# Patient Record
Sex: Female | Born: 1965 | Race: Black or African American | Hispanic: No | Smoking: Never smoker
Health system: Southern US, Community
[De-identification: ages and names within clinical notes are randomized; demographics above are authoritative.]

## PROBLEM LIST (undated history)

## (undated) DIAGNOSIS — I10 Essential (primary) hypertension: Secondary | ICD-10-CM

## (undated) DIAGNOSIS — M1990 Unspecified osteoarthritis, unspecified site: Secondary | ICD-10-CM

## (undated) HISTORY — PX: ENDOMETRIAL ABLATION: SHX621

## (undated) HISTORY — PX: FOOT SURGERY: SHX648

## (undated) HISTORY — PX: ANKLE SURGERY: SHX546

## (undated) HISTORY — PX: KNEE SURGERY: SHX244

## (undated) HISTORY — PX: KNEE ARTHROPLASTY: SHX992

---

## 1997-10-06 ENCOUNTER — Other Ambulatory Visit: Admission: RE | Admit: 1997-10-06 | Discharge: 1997-10-06 | Payer: Self-pay | Admitting: Obstetrics and Gynecology

## 1999-08-07 ENCOUNTER — Other Ambulatory Visit: Admission: RE | Admit: 1999-08-07 | Discharge: 1999-08-07 | Payer: Self-pay | Admitting: Obstetrics and Gynecology

## 1999-12-29 ENCOUNTER — Emergency Department (HOSPITAL_COMMUNITY): Admission: EM | Admit: 1999-12-29 | Discharge: 1999-12-29 | Payer: Self-pay | Admitting: Emergency Medicine

## 2001-05-04 ENCOUNTER — Other Ambulatory Visit: Admission: RE | Admit: 2001-05-04 | Discharge: 2001-05-04 | Payer: Self-pay | Admitting: Obstetrics and Gynecology

## 2002-09-19 ENCOUNTER — Other Ambulatory Visit: Admission: RE | Admit: 2002-09-19 | Discharge: 2002-09-19 | Payer: Self-pay | Admitting: *Deleted

## 2002-12-28 ENCOUNTER — Emergency Department (HOSPITAL_COMMUNITY): Admission: EM | Admit: 2002-12-28 | Discharge: 2002-12-28 | Payer: Self-pay | Admitting: Emergency Medicine

## 2004-02-15 ENCOUNTER — Ambulatory Visit: Payer: Self-pay | Admitting: Internal Medicine

## 2004-04-08 ENCOUNTER — Ambulatory Visit: Payer: Self-pay | Admitting: Internal Medicine

## 2004-08-21 ENCOUNTER — Ambulatory Visit: Payer: Self-pay | Admitting: Internal Medicine

## 2005-02-04 ENCOUNTER — Ambulatory Visit: Payer: Self-pay | Admitting: Internal Medicine

## 2005-02-17 ENCOUNTER — Ambulatory Visit (HOSPITAL_COMMUNITY): Admission: RE | Admit: 2005-02-17 | Discharge: 2005-02-17 | Payer: Self-pay | Admitting: Internal Medicine

## 2005-02-27 ENCOUNTER — Ambulatory Visit: Payer: Self-pay | Admitting: Endocrinology

## 2005-02-28 ENCOUNTER — Ambulatory Visit: Payer: Self-pay | Admitting: Endocrinology

## 2005-03-20 ENCOUNTER — Encounter (INDEPENDENT_AMBULATORY_CARE_PROVIDER_SITE_OTHER): Payer: Self-pay | Admitting: *Deleted

## 2005-03-20 ENCOUNTER — Other Ambulatory Visit: Admission: RE | Admit: 2005-03-20 | Discharge: 2005-03-20 | Payer: Self-pay | Admitting: Interventional Radiology

## 2005-03-20 ENCOUNTER — Encounter: Admission: RE | Admit: 2005-03-20 | Discharge: 2005-03-20 | Payer: Self-pay | Admitting: Endocrinology

## 2005-12-02 ENCOUNTER — Ambulatory Visit: Payer: Self-pay | Admitting: Internal Medicine

## 2005-12-03 ENCOUNTER — Ambulatory Visit: Payer: Self-pay | Admitting: Internal Medicine

## 2006-11-19 ENCOUNTER — Encounter: Payer: Self-pay | Admitting: Internal Medicine

## 2006-11-25 ENCOUNTER — Ambulatory Visit: Payer: Self-pay | Admitting: Internal Medicine

## 2006-11-26 ENCOUNTER — Ambulatory Visit: Payer: Self-pay | Admitting: Internal Medicine

## 2006-11-26 LAB — CONVERTED CEMR LAB
ALT: 20 units/L (ref 0–35)
Albumin: 3.4 g/dL — ABNORMAL LOW (ref 3.5–5.2)
Alkaline Phosphatase: 56 units/L (ref 39–117)
BUN: 12 mg/dL (ref 6–23)
Basophils Relative: 0.6 % (ref 0.0–1.0)
Bilirubin Urine: NEGATIVE
CO2: 31 meq/L (ref 19–32)
Calcium: 8.9 mg/dL (ref 8.4–10.5)
Chloride: 104 meq/L (ref 96–112)
Cholesterol: 200 mg/dL (ref 0–200)
Crystals: NEGATIVE
Eosinophils Relative: 3.4 % (ref 0.0–5.0)
GFR calc Af Amer: 79 mL/min
Glucose, Bld: 102 mg/dL — ABNORMAL HIGH (ref 70–99)
HCT: 35.5 % — ABNORMAL LOW (ref 36.0–46.0)
HDL: 46.9 mg/dL (ref >39.0)
Ketones, ur: NEGATIVE mg/dL
LDL Cholesterol: 140 mg/dL — ABNORMAL HIGH (ref 0–99)
Leukocytes, UA: NEGATIVE
Lymphocytes Relative: 44.3 % (ref 12.0–46.0)
MCV: 86.2 fL (ref 78.0–100.0)
Monocytes Absolute: 0.3 10*3/uL (ref 0.2–0.7)
Monocytes Relative: 7.2 % (ref 3.0–11.0)
Mucus, UA: NEGATIVE
Neutrophils Relative %: 44.5 % (ref 43.0–77.0)
Nitrite: NEGATIVE
Platelets: 256 10*3/uL (ref 150–400)
Potassium: 3.8 meq/L (ref 3.5–5.1)
RBC: 4.12 M/uL (ref 3.87–5.11)
RDW: 14.1 % (ref 11.5–14.6)
Sodium: 141 meq/L (ref 135–145)
Specific Gravity, Urine: 1.025 (ref 1.000–1.03)
Total Bilirubin: 0.5 mg/dL (ref 0.3–1.2)
Total CHOL/HDL Ratio: 4.3
Total Protein, Urine: NEGATIVE mg/dL
Triglycerides: 64 mg/dL (ref 0–149)
Urine Glucose: NEGATIVE mg/dL
Urobilinogen, UA: 0.2 (ref 0.0–1.0)
VLDL: 13 mg/dL (ref 0–40)
WBC: 3.8 10*3/uL — ABNORMAL LOW (ref 4.5–10.5)
pH: 6 (ref 5.0–8.0)

## 2007-02-04 ENCOUNTER — Encounter: Admission: RE | Admit: 2007-02-04 | Discharge: 2007-02-04 | Payer: Self-pay | Admitting: Internal Medicine

## 2007-03-05 ENCOUNTER — Encounter: Payer: Self-pay | Admitting: Internal Medicine

## 2007-03-05 DIAGNOSIS — IMO0002 Reserved for concepts with insufficient information to code with codable children: Secondary | ICD-10-CM | POA: Insufficient documentation

## 2007-04-06 ENCOUNTER — Ambulatory Visit: Payer: Self-pay | Admitting: Family Medicine

## 2007-04-13 ENCOUNTER — Ambulatory Visit: Payer: Self-pay | Admitting: Family Medicine

## 2007-05-18 ENCOUNTER — Ambulatory Visit: Payer: Self-pay | Admitting: Family Medicine

## 2007-06-01 ENCOUNTER — Ambulatory Visit: Payer: Self-pay | Admitting: Family Medicine

## 2007-08-17 ENCOUNTER — Ambulatory Visit: Payer: Self-pay | Admitting: Internal Medicine

## 2007-08-17 DIAGNOSIS — J040 Acute laryngitis: Secondary | ICD-10-CM | POA: Insufficient documentation

## 2007-08-17 DIAGNOSIS — J309 Allergic rhinitis, unspecified: Secondary | ICD-10-CM | POA: Insufficient documentation

## 2007-09-23 ENCOUNTER — Telehealth: Payer: Self-pay | Admitting: Internal Medicine

## 2007-12-20 ENCOUNTER — Telehealth: Payer: Self-pay | Admitting: Internal Medicine

## 2008-04-06 ENCOUNTER — Ambulatory Visit: Payer: Self-pay | Admitting: Internal Medicine

## 2008-04-06 DIAGNOSIS — R635 Abnormal weight gain: Secondary | ICD-10-CM | POA: Insufficient documentation

## 2008-04-06 DIAGNOSIS — R609 Edema, unspecified: Secondary | ICD-10-CM | POA: Insufficient documentation

## 2008-07-21 ENCOUNTER — Ambulatory Visit: Payer: Self-pay | Admitting: Internal Medicine

## 2008-07-24 LAB — CONVERTED CEMR LAB
ALT: 17 units/L (ref 0–35)
AST: 20 units/L (ref 0–37)
Albumin: 3.6 g/dL (ref 3.5–5.2)
Alkaline Phosphatase: 61 units/L (ref 39–117)
BUN: 12 mg/dL (ref 6–23)
Basophils Absolute: 0 10*3/uL (ref 0.0–0.1)
Basophils Relative: 0.5 % (ref 0.0–3.0)
Bilirubin Urine: NEGATIVE
Bilirubin, Direct: 0.1 mg/dL (ref 0.0–0.3)
CO2: 31 meq/L (ref 19–32)
Calcium: 8.9 mg/dL (ref 8.4–10.5)
Chloride: 105 meq/L (ref 96–112)
Creatinine, Ser: 1 mg/dL (ref 0.4–1.2)
Eosinophils Absolute: 0.1 10*3/uL (ref 0.0–0.7)
Eosinophils Relative: 4.2 % (ref 0.0–5.0)
GFR calc non Af Amer: 77.79 mL/min (ref >60)
Glucose, Bld: 93 mg/dL (ref 70–99)
HCT: 35 % — ABNORMAL LOW (ref 36.0–46.0)
Hemoglobin: 11.5 g/dL — ABNORMAL LOW (ref 12.0–15.0)
Ketones, ur: NEGATIVE mg/dL
Leukocytes, UA: NEGATIVE
Lymphocytes Relative: 39.9 % (ref 12.0–46.0)
Lymphs Abs: 1.4 10*3/uL (ref 0.7–4.0)
MCHC: 32.8 g/dL (ref 30.0–36.0)
MCV: 87.6 fL (ref 78.0–100.0)
Monocytes Absolute: 0.3 10*3/uL (ref 0.1–1.0)
Monocytes Relative: 9 % (ref 3.0–12.0)
Neutro Abs: 1.6 10*3/uL (ref 1.4–7.7)
Neutrophils Relative %: 46.4 % (ref 43.0–77.0)
Nitrite: NEGATIVE
Platelets: 225 10*3/uL (ref 150.0–400.0)
Potassium: 3.9 meq/L (ref 3.5–5.1)
RBC: 3.99 M/uL (ref 3.87–5.11)
RDW: 13.7 % (ref 11.5–14.6)
Sodium: 140 meq/L (ref 135–145)
Specific Gravity, Urine: 1.02 (ref 1.000–1.030)
TSH: 0.63 microintl units/mL (ref 0.35–5.50)
Total Bilirubin: 0.6 mg/dL (ref 0.3–1.2)
Total Protein, Urine: NEGATIVE mg/dL
Total Protein: 7.7 g/dL (ref 6.0–8.3)
Urine Glucose: NEGATIVE mg/dL
Urobilinogen, UA: 0.2 (ref 0.0–1.0)
WBC: 3.4 10*3/uL — ABNORMAL LOW (ref 4.5–10.5)
pH: 6.5 (ref 5.0–8.0)

## 2008-07-26 LAB — CONVERTED CEMR LAB: Vit D, 25-Hydroxy: 21 ng/mL — ABNORMAL LOW (ref 30–89)

## 2008-12-26 ENCOUNTER — Ambulatory Visit: Payer: Self-pay | Admitting: Internal Medicine

## 2008-12-26 DIAGNOSIS — F3289 Other specified depressive episodes: Secondary | ICD-10-CM | POA: Insufficient documentation

## 2008-12-26 DIAGNOSIS — F329 Major depressive disorder, single episode, unspecified: Secondary | ICD-10-CM | POA: Insufficient documentation

## 2009-01-26 ENCOUNTER — Ambulatory Visit: Payer: Self-pay | Admitting: Internal Medicine

## 2009-01-26 LAB — CONVERTED CEMR LAB
Bilirubin Urine: NEGATIVE
Ketones, ur: NEGATIVE mg/dL
Nitrite: POSITIVE
Specific Gravity, Urine: 1.015 (ref 1.000–1.030)
Urine Glucose: NEGATIVE mg/dL
Urobilinogen, UA: 0.2 (ref 0.0–1.0)
pH: 7 (ref 5.0–8.0)

## 2009-04-06 ENCOUNTER — Ambulatory Visit: Payer: Self-pay | Admitting: Internal Medicine

## 2009-07-19 ENCOUNTER — Ambulatory Visit: Payer: Self-pay | Admitting: Internal Medicine

## 2009-07-19 LAB — CONVERTED CEMR LAB
Bilirubin Urine: NEGATIVE
Ketones, ur: NEGATIVE mg/dL
Nitrite: NEGATIVE
Specific Gravity, Urine: >1.03 (ref 1.000–1.030)
Total Protein, Urine: 100 mg/dL
Urine Glucose: NEGATIVE mg/dL
Urobilinogen, UA: 0.2 (ref 0.0–1.0)
pH: 6.6 (ref 5.0–8.0)

## 2009-08-05 ENCOUNTER — Emergency Department (HOSPITAL_COMMUNITY): Admission: EM | Admit: 2009-08-05 | Discharge: 2009-08-05 | Payer: Self-pay | Admitting: Emergency Medicine

## 2009-08-07 ENCOUNTER — Ambulatory Visit (HOSPITAL_COMMUNITY): Admission: RE | Admit: 2009-08-07 | Discharge: 2009-08-07 | Payer: Self-pay | Admitting: Obstetrics

## 2009-10-09 ENCOUNTER — Ambulatory Visit: Payer: Self-pay | Admitting: Internal Medicine

## 2009-10-10 DIAGNOSIS — B370 Candidal stomatitis: Secondary | ICD-10-CM | POA: Insufficient documentation

## 2009-12-05 ENCOUNTER — Ambulatory Visit: Payer: Self-pay | Admitting: Internal Medicine

## 2009-12-05 DIAGNOSIS — L03119 Cellulitis of unspecified part of limb: Secondary | ICD-10-CM

## 2009-12-05 DIAGNOSIS — L02419 Cutaneous abscess of limb, unspecified: Secondary | ICD-10-CM | POA: Insufficient documentation

## 2009-12-07 ENCOUNTER — Telehealth: Payer: Self-pay | Admitting: Internal Medicine

## 2009-12-10 ENCOUNTER — Telehealth: Payer: Self-pay | Admitting: Internal Medicine

## 2010-02-14 ENCOUNTER — Ambulatory Visit: Payer: Self-pay | Admitting: Internal Medicine

## 2010-02-14 ENCOUNTER — Telehealth: Payer: Self-pay | Admitting: Internal Medicine

## 2010-02-18 LAB — CONVERTED CEMR LAB
BUN: 14 mg/dL (ref 6–23)
Basophils Absolute: 0 10*3/uL (ref 0.0–0.1)
Basophils Relative: 0.4 % (ref 0.0–3.0)
CO2: 28 meq/L (ref 19–32)
Calcium: 8.8 mg/dL (ref 8.4–10.5)
Chloride: 101 meq/L (ref 96–112)
Creatinine, Ser: 0.9 mg/dL (ref 0.4–1.2)
Eosinophils Absolute: 0.1 10*3/uL (ref 0.0–0.7)
Eosinophils Relative: 1.8 % (ref 0.0–5.0)
GFR calc non Af Amer: 85.03 mL/min (ref >60)
Glucose, Bld: 93 mg/dL (ref 70–99)
HCT: 37.4 % (ref 36.0–46.0)
Hemoglobin: 12.2 g/dL (ref 12.0–15.0)
Lymphocytes Relative: 37.4 % (ref 12.0–46.0)
Lymphs Abs: 1.5 10*3/uL (ref 0.7–4.0)
MCHC: 32.7 g/dL (ref 30.0–36.0)
MCV: 89.6 fL (ref 78.0–100.0)
Monocytes Absolute: 0.3 10*3/uL (ref 0.1–1.0)
Monocytes Relative: 6.6 % (ref 3.0–12.0)
Neutro Abs: 2.2 10*3/uL (ref 1.4–7.7)
Neutrophils Relative %: 53.8 % (ref 43.0–77.0)
Platelets: 227 10*3/uL (ref 150.0–400.0)
Potassium: 3.8 meq/L (ref 3.5–5.1)
RBC: 4.17 M/uL (ref 3.87–5.11)
RDW: 14.5 % (ref 11.5–14.6)
Sodium: 138 meq/L (ref 135–145)
TSH: 0.42 microintl units/mL (ref 0.35–5.50)
Vitamin B-12: 322 pg/mL (ref 211–911)
WBC: 4 10*3/uL — ABNORMAL LOW (ref 4.5–10.5)

## 2010-04-21 ENCOUNTER — Encounter: Payer: Self-pay | Admitting: Internal Medicine

## 2010-05-02 NOTE — Progress Notes (Signed)
Summary: Cramps  Phone Note Call from Patient   Caller: Patient Summary of Call: pt c/o cramps all over especially in feet. She is requesting to have her Kcl checked. Please advise Initial call taken by: Lanier Prude, [facility]),  February 14, 2010 8:18 AM  Follow-up for Phone Call        ok BMET CBC TSH  Vit B12 729.5  995.20 Follow-up by: Tresa Garter MD,  February 14, 2010 12:38 PM  Additional Follow-up for Phone Call Additional follow up Details #1::        pt informed orders entered into IDX. Additional Follow-up by: Lanier Prude, [facility]),  February 14, 2010 2:24 PM

## 2010-05-02 NOTE — Miscellaneous (Signed)
Summary: Special Procedure/[facility]  Special Procedure/[facility]   Imported By: Lester Almena 12/10/2009 07:43:29  _____________________________________________________________________  External Attachment:    Type:   Image     Comment:   External Document

## 2010-05-02 NOTE — Assessment & Plan Note (Signed)
Summary: TONGUE IS COATED/NWS   Vital Signs:  Patient profile:   45 year old female Height:      65 inches (165.10 cm) O2 Sat:      96 % on Room air Temp:     98.8 degrees F (37.11 degrees C) oral Pulse rate:   95 / minute BP sitting:   128 / 82  (left arm) Cuff size:   large  Vitals Entered By: Orlan Leavens (October 09, 2009 4:03 PM)  O2 Flow:  Room air CC: coated white tongue Is Patient Diabetic? No Pain Assessment Patient in pain? no        Primary Care Provider:  Georgina Quint Plotnikov MD  CC:  coated white tongue.  History of Present Illness: c/o white coating on tongue - +hx same - recent cipro use - no pain swallowing - no fever  Current Medications (verified): 1)  Loratadine 10 Mg  Tabs (Loratadine) .... Once Daily As Needed Allergies 2)  Fluticasone Propionate 50 Mcg/act  Susp (Fluticasone Propionate) .... 2 Sprays Each Nostril Once Daily 3)  Vitamin D3 1000 Unit  Tabs (Cholecalciferol) .Marland Kitchen.. 1 By Mouth Daily 4)  Zolpidem Tartrate 10 Mg  Tabs (Zolpidem Tartrate) .... 1/2 or 1 By Mouth At [facility] Prn 5)  Furosemide 20 Mg Tabs (Furosemide) .... Take 1 Tab By Mouth Every Day 6)  Potassium Chloride Cr 10 Meq  Tbcr (Potassium Chloride) .Marland Kitchen.. 1 By Mouth Once Daily W/furosemide 7)  Wellbutrin Sr 150 Mg Xr12h-Tab (Bupropion Hcl) .Marland Kitchen.. 1 By Mouth Two Times A Day  Allergies (verified): No Known Drug Allergies  Past History:  Past Medical History: Obesity Allergic rhinitis Depression situational 2010  Review of Systems  The patient denies anorexia, fever, and abdominal pain.    Physical Exam  General:  alert, well-developed, well-nourished, and cooperative to examination.    Mouth:  +candida changes on tongue Lungs:  normal respiratory effort, no intercostal retractions or use of accessory muscles; normal breath sounds bilaterally - no crackles and no wheezes.    Heart:  normal rate, regular rhythm, no murmur, and no rub. BLE without edema.   Impression &  Recommendations:  Problem # 1:  CANDIDIASIS OF MOUTH (ICD-112.0) Oravig samples rx'd x 6 days -  if unimproved, pt has used mycelex troches for same in past with good outcome - education and reassurance provided  Complete Medication List: 1)  Loratadine 10 Mg Tabs (Loratadine) .... Once daily as needed allergies 2)  Fluticasone Propionate 50 Mcg/act Susp (Fluticasone propionate) .... 2 sprays each nostril once daily 3)  Vitamin D3 1000 Unit Tabs (Cholecalciferol) .Marland Kitchen.. 1 by mouth daily 4)  Zolpidem Tartrate 10 Mg Tabs (Zolpidem tartrate) .... 1/2 or 1 by mouth at hs prn 5)  Furosemide 20 Mg Tabs (Furosemide) .... Take 1 tab by mouth every day 6)  Potassium Chloride Cr 10 Meq Tbcr (Potassium chloride) .Marland Kitchen.. 1 by mouth once daily w/furosemide 7)  Wellbutrin Sr 150 Mg Xr12h-tab (Bupropion hcl) .Marland Kitchen.. 1 by mouth two times a day 8)  Oravig 50 Mg Tabs (Miconazole) .... Apply to gum  every morning  Patient Instructions: 1)  it was good to see you today. 2)  Drue Stager - apply to gum every AM (as per picture directions) - this will relase medcine into mouth during the day and may not completely dissolve! let us know if presciption is needed for this 3)  Please schedule a follow-up appointment as needed.

## 2010-05-02 NOTE — Progress Notes (Signed)
Summary: Yeast Inf-Need Rx  Phone Note Call from Patient   Summary of Call: pt has yeast infection from Abx.  She needs Rx for Diflucan or something similar. Please advise Initial call taken by: Lanier Prude, [facility]),  December 10, 2009 2:23 PM  Follow-up for Phone Call        ok Diflucan Do we need to I&D the abscess? Follow-up by: Tresa Garter MD,  December 10, 2009 4:50 PM    New/Updated Medications: DIFLUCAN 150 MG TABS (FLUCONAZOLE) 1 by mouth once daily once for yeast infection Prescriptions: DIFLUCAN 150 MG TABS (FLUCONAZOLE) 1 by mouth once daily once for yeast infection  #3 x 1   Entered and Authorized by:   Tresa Garter MD   Signed by:   Tresa Garter MD on 12/10/2009   Method used:   Electronically to        [facility]. [id number]* (retail)       [address]       [place], [place]  [address]       Ph: [phone]       Fax: [phone]   RxID:   [id number]

## 2010-05-02 NOTE — Miscellaneous (Signed)
Summary: UA/Cipro  Clinical Lists Changes  Medications: Added new medication of CIPRO 250 MG TABS (CIPROFLOXACIN HCL) 1 by mouth bid - Signed Rx of CIPRO 250 MG TABS (CIPROFLOXACIN HCL) 1 by mouth bid;  #10 x 0;  Signed;  Entered by: Lucious Groves;  Authorized by: Tresa Garter MD;  Method used: Print then Give to Patient    Prescriptions: CIPRO 250 MG TABS (CIPROFLOXACIN HCL) 1 by mouth bid  #10 x 0   Entered by:   Lucious Groves   Authorized by:   Tresa Garter MD   Signed by:   Lucious Groves on 07/19/2009   Method used:   Print then Give to Patient   RxID:   [id number]      Patient aware and picked up prescription in office earlier this morning. Lucious Groves  July 19, 2009 11:50 AM

## 2010-05-02 NOTE — Assessment & Plan Note (Signed)
Summary: INSECT BITE? /NWS   Vital Signs:  Patient profile:   45 year old female Height:      65 inches Weight:      194 pounds BMI:     32.40 O2 Sat:      97 % on Room air Temp:     97.5 degrees F oral Pulse rate:   81 / minute Pulse rhythm:   regular Resp:     16 per minute BP sitting:   150 / 90  (left arm) Cuff size:   large  Vitals Entered By: Lanier Prude, CMA(AAMA) (December 05, 2009 4:36 PM)  O2 Flow:  Room air  Procedure Note  Incision & Drainage: The patient complains of pain, inflammation, and swelling. Onset of lesion: 2 days Indication: infected lesion Consent signed: yes  Procedure # 1: I & D    Size (in cm): 1.2 x 1.0    Region: posterior    Location: R thigh    Comment: Risks including but not limited by incomplete procedure, bleeding, infection, recurrence were discussed with the patient. Consent form was signed.   6-7 mm wide and 1 cm deep incision w/a strait blade made. No pus obtained. Tolerated well. Complicatons - none. No pain relief following the procedure.     Instrument used: #11 blade    Anesthesia: spray  Cleaned and prepped with: alcohol and betadine Wound dressing: bacitracin and bandaid Instructions: daily dressing changes  CC: insect bite on Rt posterior thigh 2 days ago Is Patient Diabetic? No Comments pt is not currently taking any meds   Primary Care Provider:  Georgina Quint Yigit Norkus MD  CC:  insect bite on Rt posterior thigh 2 days ago.  History of Present Illness: C/o insect bite on Sun - now it is big and red and painful in  R post thigh  Current Medications (verified): 1)  Loratadine 10 Mg  Tabs (Loratadine) .... Once Daily As Needed Allergies 2)  Fluticasone Propionate 50 Mcg/act  Susp (Fluticasone Propionate) .... 2 Sprays Each Nostril Once Daily 3)  Vitamin D3 1000 Unit  Tabs (Cholecalciferol) .Marland Kitchen.. 1 By Mouth Daily 4)  Zolpidem Tartrate 10 Mg  Tabs (Zolpidem Tartrate) .... 1/2 or 1 By Mouth At [facility] Prn 5)  Furosemide 20  Mg Tabs (Furosemide) .... Take 1 Tab By Mouth Every Day 6)  Potassium Chloride Cr 10 Meq  Tbcr (Potassium Chloride) .Marland Kitchen.. 1 By Mouth Once Daily W/furosemide 7)  Wellbutrin Sr 150 Mg Xr12h-Tab (Bupropion Hcl) .Marland Kitchen.. 1 By Mouth Two Times A Day  Allergies (verified): 1)  ! Latex Exam Gloves (Disposable Gloves)  Past History:  Past Medical History: Last updated: 10/09/2009 Obesity Allergic rhinitis Depression situational 2010  Social History: Last updated: 12/26/2008 Occupation: Adult nurse, adm assistant Married separated 2010 Never Smoked Has a grandson  Physical Exam  General:  alert, well-developed, well-nourished, and cooperative to examination.    Mouth:  +candida changes on tongue Msk:  No deformity or scoliosis noted of thoracic or lumbar spine.   Skin:  R mid posterior thigh with 1.5x 1.5 cm painful induration with a 2 mm scab in the middle, no d/c; 2 cm erethema   Impression & Recommendations:  Problem # 1:  ABSCESS, LEG (ICD-682.6) R thigh Assessment New See procedure: abscess is not formed yet. Will repeat I&D if this phlegmone maturates Her updated medication list for this problem includes:    Doxycycline Hyclate 100 Mg Caps (Doxycycline hyclate) .Marland Kitchen... 1 by mouth two times a day  with a glass of water  Complete Medication List: 1)  Loratadine 10 Mg Tabs (Loratadine) .... Once daily as needed allergies 2)  Fluticasone Propionate 50 Mcg/act Susp (Fluticasone propionate) .... 2 sprays each nostril once daily 3)  Vitamin D3 1000 Unit Tabs (Cholecalciferol) .Marland Kitchen.. 1 by mouth daily 4)  Zolpidem Tartrate 10 Mg Tabs (Zolpidem tartrate) .... 1/2 or 1 by mouth at hs prn 5)  Furosemide 20 Mg Tabs (Furosemide) .... Take 1 tab by mouth every day 6)  Potassium Chloride Cr 10 Meq Tbcr (Potassium chloride) .Marland Kitchen.. 1 by mouth once daily w/furosemide 7)  Wellbutrin Sr 150 Mg Xr12h-tab (Bupropion hcl) .Marland Kitchen.. 1 by mouth two times a day 8)  Doxycycline Hyclate 100 Mg Caps (Doxycycline hyclate)  .Marland Kitchen.. 1 by mouth two times a day with a glass of water 9)  Hydrocodone-acetaminophen 5-325 Mg Tabs (Hydrocodone-acetaminophen) .Marland Kitchen.. 1-2 by mouth two times a day as needed pain  Other Orders: I&D Abscess, Simple / Single (10060)  Patient Instructions: 1)  Call if you are not better in a reasonable amount of time or if worse.  Prescriptions: DOXYCYCLINE HYCLATE 100 MG CAPS (DOXYCYCLINE HYCLATE) 1 by mouth two times a day with a glass of water  #40 x 1   Entered and Authorized by:   Tresa Garter MD   Signed by:   Tresa Garter MD on 12/05/2009   Method used:   Print then Give to Patient   RxID:   [id number] HYDROCODONE-ACETAMINOPHEN 5-325 MG TABS (HYDROCODONE-ACETAMINOPHEN) 1-2 by mouth two times a day as needed pain  #20 x 0   Entered and Authorized by:   Tresa Garter MD   Signed by:   Tresa Garter MD on 12/05/2009   Method used:   Print then Give to Patient   RxID:   [id number] DOXYCYCLINE HYCLATE 100 MG CAPS (DOXYCYCLINE HYCLATE) 1 by mouth two times a day with a glass of water  #40 x 1   Entered and Authorized by:   Tresa Garter MD   Signed by:   Tresa Garter MD on 12/05/2009   Method used:   Electronically to        [facility]. [id number]* (retail)       [address]       [place], [place]  [address]       Ph: [phone]       Fax: [phone]   RxID:   [id number]

## 2010-05-02 NOTE — Progress Notes (Signed)
Summary: No improvement in symptoms  Phone Note Call from Patient   Summary of Call: pt is not better. She states leg is extremely painful and is draining.  She cant sit properly.  Please advise Initial call taken by: Lanier Prude, [facility]),  December 07, 2009 8:36 AM  Follow-up for Phone Call        we need to do I&D  today Follow-up by: Tresa Garter MD,  December 07, 2009 1:20 PM  Additional Follow-up for Phone Call Additional follow up Details #1::        Pt informed  Additional Follow-up by: Lamar Sprinkles, CMA,  December 07, 2009 1:39 PM

## 2010-06-18 LAB — URINALYSIS, ROUTINE W REFLEX MICROSCOPIC
Bilirubin Urine: NEGATIVE
Glucose, UA: NEGATIVE mg/dL
Hgb urine dipstick: NEGATIVE
Ketones, ur: NEGATIVE mg/dL
Nitrite: NEGATIVE
Protein, ur: NEGATIVE mg/dL
Specific Gravity, Urine: 1.02 (ref 1.005–1.030)
Urobilinogen, UA: 1 mg/dL (ref 0.0–1.0)
pH: 7 (ref 5.0–8.0)

## 2010-06-18 LAB — WET PREP, GENITAL
Trich, Wet Prep: NONE SEEN
Yeast Wet Prep HPF POC: NONE SEEN

## 2010-06-18 LAB — CBC
HCT: 34.7 % — ABNORMAL LOW (ref 36.0–46.0)
Hemoglobin: 11.4 g/dL — ABNORMAL LOW (ref 12.0–15.0)
MCHC: 33 g/dL (ref 30.0–36.0)
MCV: 87.2 fL (ref 78.0–100.0)
Platelets: 237 10*3/uL (ref 150–400)
RBC: 3.98 MIL/uL (ref 3.87–5.11)
RDW: 15.8 % — ABNORMAL HIGH (ref 11.5–15.5)
WBC: 3.8 10*3/uL — ABNORMAL LOW (ref 4.0–10.5)

## 2010-06-18 LAB — GC/CHLAMYDIA PROBE AMP, GENITAL
Chlamydia, DNA Probe: NEGATIVE
GC Probe Amp, Genital: NEGATIVE

## 2010-06-18 LAB — ABO/RH: ABO/RH(D): O POS

## 2010-06-18 LAB — TYPE AND SCREEN
ABO/RH(D): O POS
Antibody Screen: NEGATIVE

## 2010-06-18 LAB — PREGNANCY, URINE
Preg Test, Ur: NEGATIVE
Preg Test, Ur: NEGATIVE

## 2010-08-13 NOTE — Assessment & Plan Note (Signed)
[facility]                           PRIMARY CARE OFFICE NOTE   Amber Reilly, Amber Reilly                      MRN:          [id number]  DATE:11/25/2006                            DOB:          10-28-1965    The patient is a 45 year old female who presents for a wellness  examination.  She would like to start a professed diet (Optifast) at  [facility] office with Dr. Laury Reilly.   Past medical history, family history, social history as per September 05, 2003  note.   REVIEW OF SYSTEMS:  No chest pain or shortness of breath.  No syncope.  No neurologic complaints.  She admits to some headaches following meals  excessive in salt.  No leg swelling.  The rest of the 18 point review of  systems is negative.   PHYSICAL:  Blood pressure 157/103, pulse 77, temperature 97, weight 222  (was 216).  She is overweight.  HEENT:  Moist mucosa.  NECK:  Supple, no thyromegaly or bruit.  LUNGS:  Clear, no wheeze or rales.  HEART:  S1-S2, no murmur no gallop.  ABDOMEN:  Soft, nontender, no organomegaly or masses.  LOWER EXTREMITIES:  With trace edema.  She alert, oriented and cooperative, denies being depressed.  SKIN:  Clear.   LABS:  None available.   ASSESSMENT/PLAN:  1. Normal wellness examination, age/health related issues discussed,      healthy lifestyle discussed.  She goes to [facility] on      a yearly basis for GYN exam.  She will start a weight loss program      with Dr. Laury Reilly.  2. Hypertension, discussed low salt diet.  Lotensin HCT 20/25 one      daily to start, potential side effects discussed.  Follow up with      me in 2 months.  3. Headache due to problem #1 most likely.   Plan as above, obtain lab work.  Call with problems.     Amber Quint. Plotnikov, MD  Electronically Signed    AVP/MedQ  DD: 11/26/2006  DT: 11/26/2006  Job #: [id number]   cc:   Amber Perla, DO

## 2010-12-18 ENCOUNTER — Telehealth: Payer: Self-pay | Admitting: *Deleted

## 2010-12-18 MED ORDER — AZITHROMYCIN 250 MG PO TABS: ORAL_TABLET | ORAL | Status: DC

## 2010-12-18 NOTE — Telephone Encounter (Signed)
Pt c/o sinus pain & pressure, HA and nasal congestion. She is requesting abx rx. Please advise.

## 2010-12-18 NOTE — Telephone Encounter (Signed)
Per Dr. Posey Rea ok to send in zpak.

## 2010-12-24 ENCOUNTER — Telehealth: Payer: Self-pay | Admitting: *Deleted

## 2010-12-24 MED ORDER — AZITHROMYCIN 250 MG PO TABS: ORAL_TABLET | ORAL | Status: AC

## 2010-12-24 NOTE — Telephone Encounter (Signed)
Pt left vm stating she threw away her zpak. Will send new rx. Pt informed

## 2011-04-11 ENCOUNTER — Other Ambulatory Visit: Payer: Self-pay | Admitting: *Deleted

## 2011-04-11 ENCOUNTER — Ambulatory Visit: Payer: Self-pay | Admitting: Internal Medicine

## 2011-04-11 DIAGNOSIS — R7989 Other specified abnormal findings of blood chemistry: Secondary | ICD-10-CM

## 2011-05-15 ENCOUNTER — Other Ambulatory Visit (INDEPENDENT_AMBULATORY_CARE_PROVIDER_SITE_OTHER): Payer: 59

## 2011-05-15 ENCOUNTER — Encounter: Payer: Self-pay | Admitting: Internal Medicine

## 2011-05-15 ENCOUNTER — Ambulatory Visit (INDEPENDENT_AMBULATORY_CARE_PROVIDER_SITE_OTHER): Payer: 59 | Admitting: Internal Medicine

## 2011-05-15 VITALS — BP 138/90 | HR 88 | Temp 97.3°F | Resp 16 | Wt 201.0 lb

## 2011-05-15 DIAGNOSIS — R030 Elevated blood-pressure reading, without diagnosis of hypertension: Secondary | ICD-10-CM

## 2011-05-15 DIAGNOSIS — R6889 Other general symptoms and signs: Secondary | ICD-10-CM

## 2011-05-15 DIAGNOSIS — R7989 Other specified abnormal findings of blood chemistry: Secondary | ICD-10-CM

## 2011-05-15 DIAGNOSIS — Z23 Encounter for immunization: Secondary | ICD-10-CM

## 2011-05-15 DIAGNOSIS — IMO0001 Reserved for inherently not codable concepts without codable children: Secondary | ICD-10-CM | POA: Insufficient documentation

## 2011-05-15 LAB — T4, FREE: Free T4: 0.99 ng/dL (ref 0.60–1.60)

## 2011-05-15 LAB — T3, FREE: T3, Free: 3.1 pg/mL (ref 2.3–4.2)

## 2011-05-15 MED ORDER — HYDROCHLOROTHIAZIDE 12.5 MG PO CAPS: 12.5000 mg | ORAL_CAPSULE | Freq: Every day | ORAL | Status: DC

## 2011-05-15 NOTE — Progress Notes (Deleted)
  Subjective:    Patient ID: Amber Reilly, female    DOB: Oct 15, 1965, 46 y.o.   MRN: [id number]  HPI  F/u abn TSH, elev BP  Review of Systems     Objective:   Physical Exam        Assessment & Plan:

## 2011-05-15 NOTE — Assessment & Plan Note (Signed)
BP Readings from Last 3 Encounters:  05/15/11 138/90  12/05/09 150/90  10/09/09 128/82

## 2011-05-15 NOTE — Patient Instructions (Addendum)
BP Readings from Last 3 Encounters:  05/15/11 138/90  12/05/09 150/90  10/09/09 128/82   Normal BP<130/85  Please sch a physical w/labs w/Dr Algie Coffer

## 2011-06-11 ENCOUNTER — Encounter: Payer: Self-pay | Admitting: Internal Medicine

## 2011-06-11 DIAGNOSIS — R7989 Other specified abnormal findings of blood chemistry: Secondary | ICD-10-CM | POA: Insufficient documentation

## 2011-06-11 NOTE — Progress Notes (Signed)
  Subjective:    Patient ID: Amber Reilly, female    DOB: 11-15-1965, 46 y.o.   MRN: [id number]  HPI  F/u abn TSH, elev BP  Review of Systems  Constitutional: Negative for fatigue.  HENT: Negative for congestion.   Respiratory: Negative for cough.   Genitourinary: Negative for urgency.  Psychiatric/Behavioral: Negative for dysphoric mood.   Wt Readings from Last 3 Encounters:  05/15/11 201 lb (91.173 kg)  12/05/09 194 lb (87.998 kg)  12/26/08 201 lb (91.173 kg)   BP Readings from Last 3 Encounters:  05/15/11 138/90  12/05/09 150/90  10/09/09 128/82         Objective:   Physical Exam  Constitutional: She appears well-developed. No distress.       Obese   Neck: No tracheal deviation present.  Cardiovascular:  No murmur heard. Pulmonary/Chest: She has no wheezes. She has no rales.  Musculoskeletal: She exhibits no edema.    Lab Results  Component Value Date   WBC 4.0* 02/14/2010   HGB 12.2 02/14/2010   HCT 37.4 02/14/2010   PLT 227.0 02/14/2010   GLUCOSE 93 02/14/2010   CHOL 200 11/26/2006   TRIG 64 11/26/2006   HDL 46.9 11/26/2006   LDLCALC 140* 11/26/2006   ALT 17 07/21/2008   AST 20 07/21/2008   NA 138 02/14/2010   K 3.8 02/14/2010   CL 101 02/14/2010   CREATININE 0.9 02/14/2010   BUN 14 02/14/2010   CO2 28 02/14/2010   TSH 0.47 05/15/2011         Assessment & Plan:

## 2011-06-11 NOTE — Assessment & Plan Note (Signed)
Recheck TSH 

## 2011-06-25 ENCOUNTER — Telehealth: Payer: Self-pay | Admitting: Internal Medicine

## 2011-06-25 MED ORDER — PHENTERMINE HCL 37.5 MG PO TABS: 37.5000 mg | ORAL_TABLET | Freq: Every day | ORAL | Status: DC

## 2011-06-25 NOTE — Telephone Encounter (Signed)
Asking for Phentermine OK ROV 2 mo  Please, mail the Rx to the patient.     Thx

## 2011-06-25 NOTE — Telephone Encounter (Signed)
Pt informed Rx upfront for p/u. Transferred to scheduler

## 2011-08-12 ENCOUNTER — Other Ambulatory Visit: Payer: Self-pay | Admitting: Internal Medicine

## 2011-08-12 DIAGNOSIS — Z1231 Encounter for screening mammogram for malignant neoplasm of breast: Secondary | ICD-10-CM

## 2011-08-15 ENCOUNTER — Ambulatory Visit
Admission: RE | Admit: 2011-08-15 | Discharge: 2011-08-15 | Disposition: A | Discharge status: Home / Self Care | Payer: 59 | Source: Ambulatory Visit | Attending: Internal Medicine | Admitting: Internal Medicine

## 2011-08-15 DIAGNOSIS — Z1231 Encounter for screening mammogram for malignant neoplasm of breast: Secondary | ICD-10-CM

## 2011-08-20 ENCOUNTER — Encounter: Payer: Self-pay | Admitting: Internal Medicine

## 2011-08-20 ENCOUNTER — Ambulatory Visit (INDEPENDENT_AMBULATORY_CARE_PROVIDER_SITE_OTHER): Payer: 59 | Admitting: Internal Medicine

## 2011-08-20 VITALS — BP 140/100 | HR 80 | Temp 98.5°F | Resp 16 | Wt 205.0 lb

## 2011-08-20 DIAGNOSIS — R7989 Other specified abnormal findings of blood chemistry: Secondary | ICD-10-CM

## 2011-08-20 DIAGNOSIS — L732 Hidradenitis suppurativa: Secondary | ICD-10-CM | POA: Insufficient documentation

## 2011-08-20 DIAGNOSIS — R6889 Other general symptoms and signs: Secondary | ICD-10-CM

## 2011-08-20 DIAGNOSIS — R030 Elevated blood-pressure reading, without diagnosis of hypertension: Secondary | ICD-10-CM

## 2011-08-20 MED ORDER — LOSARTAN POTASSIUM-HCTZ 100-12.5 MG PO TABS: 1.0000 | ORAL_TABLET | Freq: Every day | ORAL | Status: DC

## 2011-08-20 MED ORDER — DOXYCYCLINE HYCLATE 100 MG PO TABS: 100.0000 mg | ORAL_TABLET | Freq: Two times a day (BID) | ORAL | Status: AC

## 2011-08-20 NOTE — Progress Notes (Signed)
Patient ID: Amber Reilly, female   DOB: [birth year], 46 y.o.   MRN: [id number]  Subjective:    Patient ID: Amber Reilly, female    DOB: 09-Jul-1965, 46 y.o.   MRN: [id number]  HPI C/o L axilla swelling x 1.5 mo F/u abn TSH, elev BP  Review of Systems  Constitutional: Negative for fatigue.  HENT: Negative for congestion.   Respiratory: Negative for cough.   Genitourinary: Negative for urgency.  Psychiatric/Behavioral: Negative for dysphoric mood.   Wt Readings from Last 3 Encounters:  08/20/11 205 lb (92.987 kg)  05/15/11 201 lb (91.173 kg)  12/05/09 194 lb (87.998 kg)   BP Readings from Last 3 Encounters:  08/20/11 140/100  05/15/11 138/90  12/05/09 150/90         Objective:   Physical Exam  Constitutional: She appears well-developed. No distress.       Obese   Neck: No tracheal deviation present.  Cardiovascular:  No murmur heard. Pulmonary/Chest: She has no wheezes. She has no rales.  Musculoskeletal: She exhibits no edema.  L axilla with a cluster of 3-4 small grape subcut infiltrates - no redness or d/c  Lab Results  Component Value Date   WBC 4.0* 02/14/2010   HGB 12.2 02/14/2010   HCT 37.4 02/14/2010   PLT 227.0 02/14/2010   GLUCOSE 93 02/14/2010   CHOL 200 11/26/2006   TRIG 64 11/26/2006   HDL 46.9 11/26/2006   LDLCALC 140* 11/26/2006   ALT 17 07/21/2008   AST 20 07/21/2008   NA 138 02/14/2010   K 3.8 02/14/2010   CL 101 02/14/2010   CREATININE 0.9 02/14/2010   BUN 14 02/14/2010   CO2 28 02/14/2010   TSH 0.47 05/15/2011         Assessment & Plan:

## 2011-08-22 ENCOUNTER — Encounter: Payer: Self-pay | Admitting: Internal Medicine

## 2011-08-22 NOTE — Assessment & Plan Note (Signed)
5/13 L axilla (no need for I&D at the moment) Rx: Doxycycline Surg cons to assess for a need of operation

## 2011-08-22 NOTE — Assessment & Plan Note (Signed)
Mild 2/13; 5/13 - worse See meds

## 2011-08-22 NOTE — Assessment & Plan Note (Signed)
Check labs 

## 2011-09-16 ENCOUNTER — Ambulatory Visit (INDEPENDENT_AMBULATORY_CARE_PROVIDER_SITE_OTHER): Payer: Commercial Managed Care - PPO | Admitting: Surgery

## 2011-09-26 ENCOUNTER — Ambulatory Visit (INDEPENDENT_AMBULATORY_CARE_PROVIDER_SITE_OTHER): Payer: Commercial Managed Care - PPO | Admitting: Surgery

## 2011-10-28 ENCOUNTER — Ambulatory Visit (INDEPENDENT_AMBULATORY_CARE_PROVIDER_SITE_OTHER): Payer: Commercial Managed Care - PPO | Admitting: Surgery

## 2011-11-03 ENCOUNTER — Telehealth: Payer: Self-pay | Admitting: *Deleted

## 2011-11-03 NOTE — Telephone Encounter (Signed)
Pt c/o frequent migraines. She requests Rx for Maxalt.  She states she will make appt for next week sometime. Please advise

## 2011-11-04 MED ORDER — RIZATRIPTAN BENZOATE 10 MG PO TABS: 10.0000 mg | ORAL_TABLET | Freq: Once | ORAL | Status: DC | PRN

## 2011-11-04 NOTE — Telephone Encounter (Signed)
Ok Thx 

## 2011-11-06 ENCOUNTER — Ambulatory Visit (INDEPENDENT_AMBULATORY_CARE_PROVIDER_SITE_OTHER): Payer: Commercial Managed Care - PPO | Admitting: Surgery

## 2011-11-06 NOTE — Telephone Encounter (Signed)
Left detailed mess informing pt.  

## 2011-12-09 ENCOUNTER — Telehealth: Payer: Self-pay | Admitting: *Deleted

## 2011-12-09 NOTE — Telephone Encounter (Signed)
Rf req for Phentermine 37.5 mg 1 po qam. # 30. Last filled 08/06/2011 Ok to Rf in PCP absence?

## 2011-12-09 NOTE — Telephone Encounter (Signed)
Patient informed of Md instructions on medication.

## 2011-12-09 NOTE — Telephone Encounter (Signed)
I would not feel comfortable doing this, as this medication is not approved by the FDA for long term use; I think better to come from Dr Posey Rea if he feels would be ok  Will defer to PCP

## 2011-12-15 ENCOUNTER — Telehealth: Payer: Self-pay | Admitting: *Deleted

## 2011-12-15 NOTE — Telephone Encounter (Signed)
OK to fill this prescription with additional refills x0 Needs OV Thank you!  

## 2011-12-15 NOTE — Telephone Encounter (Signed)
Rf req for Phentermine 37.5 mg 1 po qd. # 30 Last filled 08/06/11. Ok to Rf?

## 2011-12-16 MED ORDER — PHENTERMINE HCL 37.5 MG PO TABS: 37.5000 mg | ORAL_TABLET | Freq: Every day | ORAL | Status: DC

## 2011-12-16 NOTE — Telephone Encounter (Signed)
Done. Will have schedulers contact pt.   

## 2011-12-18 ENCOUNTER — Telehealth: Payer: Self-pay | Admitting: Internal Medicine

## 2011-12-18 ENCOUNTER — Emergency Department
Admission: EM | Admit: 2011-12-18 | Discharge: 2011-12-18 | Disposition: A | Discharge status: Home / Self Care | Payer: 59 | Source: Home / Self Care | Attending: Family Medicine | Admitting: Family Medicine

## 2011-12-18 ENCOUNTER — Ambulatory Visit (INDEPENDENT_AMBULATORY_CARE_PROVIDER_SITE_OTHER): Payer: 59 | Admitting: Sports Medicine

## 2011-12-18 ENCOUNTER — Emergency Department (INDEPENDENT_AMBULATORY_CARE_PROVIDER_SITE_OTHER): Payer: 59

## 2011-12-18 DIAGNOSIS — M79609 Pain in unspecified limb: Secondary | ICD-10-CM

## 2011-12-18 DIAGNOSIS — G8929 Other chronic pain: Secondary | ICD-10-CM | POA: Insufficient documentation

## 2011-12-18 DIAGNOSIS — M79671 Pain in right foot: Secondary | ICD-10-CM

## 2011-12-18 DIAGNOSIS — M25579 Pain in unspecified ankle and joints of unspecified foot: Secondary | ICD-10-CM

## 2011-12-18 DIAGNOSIS — W108XXA Fall (on) (from) other stairs and steps, initial encounter: Secondary | ICD-10-CM

## 2011-12-18 DIAGNOSIS — M25571 Pain in right ankle and joints of right foot: Secondary | ICD-10-CM

## 2011-12-18 HISTORY — DX: Essential (primary) hypertension: I10

## 2011-12-18 MED ORDER — HYDROCODONE-ACETAMINOPHEN 5-500 MG PO TABS: 1.0000 | ORAL_TABLET | Freq: Every evening | ORAL | Status: DC | PRN

## 2011-12-18 NOTE — Progress Notes (Signed)
Subjective:    I'm seeing this patient as a consultation for:  Dr. Cathren Harsh  CC: Right ankle pain  HPI: Amber Reilly is a very pleasant 58 her old female, who unfortunately 3 weeks ago fell down the stairs and inverted her right ankle. She had immediate pain, and swelling that she localized over the dorsum of the ankle. She never sought care, but presents today for continued pain and swelling. She localized the pain of the dorsum of the ankle, as well as over the navicular prominence. The pain is localized, does not radiate, better with occasional ibuprofen.  Past medical history, Surgical history, Family history, Social history, Allergies, and medications have been entered into the medical record, reviewed, and no changes needed.   Review of Systems: No headache, visual changes, nausea, vomiting, diarrhea, constipation, dizziness, abdominal pain, skin rash, fevers, chills, night sweats, weight loss, body aches, joint swelling, muscle aches, chest pain, or shortness of breath.   Objective:   Vitals:  Afebrile, vital signs stable. General: Well Developed, well nourished, and in no acute distress.  Neuro/Psych: Alert and oriented x3, extra-ocular muscles intact, able to move all 4 extremities.  Skin: Warm and dry, no rashes noted.  Respiratory: Not using accessory muscles, speaking in full sentences, trachea midline.  Cardiovascular: Pulses palpable, no extremity edema. Abdomen: Does not appear distended. Right Ankle: Visible swelling over the dorsal tibiotalar joint, as well as over the tibialis posterior. Especially tender palpation with palpable nodule that resembled a ganglion cyst over the dorsal tibiotalar joint, also exquisitely tender to palpation over the navicular prominence medially. Range of motion is full in all directions. Strength is 5/5 in all directions. Stable lateral and medial ligaments; squeeze test and kleiger test unremarkable; Talar dome nontender; No pain at base of 5th MT;  No tenderness over cuboid; No tenderness on posterior aspects of lateral and medial malleolus No sign of peroneal tendon subluxations or tenderness to palpation Negative tarsal tunnel tinel's Able to walk 4 steps.  I reviewed her x-rays personally, they show a small, but corticated avulsion off the dorsal navicular. I did discuss this with the radiologist, it does appear old, however she is exquisitely tender to palpation over this region.  Impression and Recommendations:   This case required medical decision making of moderate complexity.

## 2011-12-18 NOTE — ED Notes (Signed)
Amber Reilly complains of foot pain x 3 weeks. She was running down the stairs in her home and fell on a toy. She describes the pain as off and on throbbing and aching. Pain is an 8/10 when she stands up. She was seen in at a [facility] office and was given a ASO to wear.

## 2011-12-18 NOTE — Assessment & Plan Note (Signed)
Is possible that the avulsion off of her dorsal navicular occurred during her injury. She also has a tibialis posterior tendinosis, as well as dorsal ligamentous sprains. The ganglion cyst also suggests tibiotalar joint, intra-articular injury. NSAID of choice. Cam Boot. We'll see her back in the office in a week.

## 2011-12-18 NOTE — ED Provider Notes (Signed)
History     CSN: [id number]  Arrival date & time 12/18/11  1537   First MD Initiated Contact with Patient 12/18/11 1556      Chief Complaint  Patient presents with  . Foot Injury    injury 3 weeks ago      HPI Comments: Patient fell down stairs 3 weeks ago, and felt a popping sensation in her right foot followed by pain/swelling.  She was evaluated at a [facility] facility where X-ray was negative.  She has had persistent pain/swelling over the dorsal aspect of ankle, and persistent pain with weight bearing.  Patient is a 46 y.o. female presenting with lower extremity pain. The history is provided by the patient.  Foot Pain This is a new problem. Episode onset: 3 weeks ago. The problem occurs constantly. The problem has been gradually worsening. Associated symptoms comments: none. The symptoms are aggravated by walking and standing. Nothing relieves the symptoms. Treatments tried: ice packs. The treatment provided no relief.    Past Medical History  Diagnosis Date  . Hypertension     Past Surgical History  Procedure Date  . Endometrial ablation     Family History  Problem Relation Age of Onset  . Heart attack Father   . Cancer Other     History  Substance Use Topics  . Smoking status: Never Smoker   . Smokeless tobacco: Never Used  . Alcohol Use: No    OB History    Grav Para Term Preterm Abortions TAB SAB Ect Mult Living                  Review of Systems  All other systems reviewed and are negative.    Allergies  Latex  Home Medications   Current Outpatient Rx  Name Route Sig Dispense Refill  . LOSARTAN POTASSIUM-HCTZ 100-12.5 MG PO TABS Oral Take 1 tablet by mouth daily. 90 tablet 3  . ONE-DAILY MULTI VITAMINS PO TABS Oral Take 1 tablet by mouth daily.    Marland Kitchen HYDROCODONE-ACETAMINOPHEN 5-500 MG PO TABS Oral Take 1 tablet by mouth at bedtime as needed for pain. 15 tablet 0  . PHENTERMINE HCL 37.5 MG PO TABS Oral Take 1 tablet (37.5 mg total) by mouth daily  before breakfast. 30 tablet 0  . RIZATRIPTAN BENZOATE 10 MG PO TABS Oral Take 1 tablet (10 mg total) by mouth once as needed for migraine. May repeat in 2 hours if needed 12 tablet 3    BP 109/74  Pulse 89  Temp 98.3 F (36.8 C) (Oral)  Resp 18  Ht 5\' 4"  (1.626 m)  Wt 194 lb (87.998 kg)  BMI 33.30 kg/m2  SpO2 97%  Physical Exam  Nursing note and vitals reviewed. Constitutional: She is oriented to person, place, and time. She appears well-developed and well-nourished. No distress.  Eyes: Conjunctivae normal are normal. Pupils are equal, round, and reactive to light.  Musculoskeletal: She exhibits tenderness.       Feet:       Tenderness/swelling as noted on diagram.  Distal Neurovascular function is intact.   Neurological: She is alert and oriented to person, place, and time.  Skin: Skin is warm and dry. No erythema.    ED Course  Procedures none   Dg Foot Complete Right  12/18/2011  *RADIOLOGY REPORT*  Clinical Data: Larey Seat down steps 3 weeks ago with pain medially  RIGHT FOOT COMPLETE - 3+ VIEW  Comparison: None.  Findings: No acute fracture is seen.  Tarsal -  metatarsal alignment is normal.  Joint spaces appear normal.  IMPRESSION: No fracture.   Original Report Authenticated By: Juline Patch, M.D.      1. Foot pain, right       MDM  Consultation obtained with Dr. Rodney Langton for diagnosis and management.        Lattie Haw, MD 12/19/11 1400

## 2011-12-18 NOTE — Telephone Encounter (Signed)
Pt fell and injured foot.  Wants to be worked in.  May also call ortho. Call if can be worked in Friday.

## 2011-12-19 NOTE — Telephone Encounter (Signed)
Ok to work in Thx 

## 2011-12-19 NOTE — Telephone Encounter (Signed)
Will have scheduler contact pt.

## 2011-12-23 ENCOUNTER — Ambulatory Visit: Payer: 59 | Admitting: Sports Medicine

## 2011-12-23 ENCOUNTER — Encounter: Payer: Self-pay | Admitting: Internal Medicine

## 2011-12-23 ENCOUNTER — Ambulatory Visit (INDEPENDENT_AMBULATORY_CARE_PROVIDER_SITE_OTHER): Payer: 59 | Admitting: Internal Medicine

## 2011-12-23 VITALS — BP 120/80 | HR 84 | Temp 98.3°F | Resp 16 | Wt 216.0 lb

## 2011-12-23 DIAGNOSIS — M25571 Pain in right ankle and joints of right foot: Secondary | ICD-10-CM

## 2011-12-23 DIAGNOSIS — E049 Nontoxic goiter, unspecified: Secondary | ICD-10-CM

## 2011-12-23 DIAGNOSIS — M25579 Pain in unspecified ankle and joints of unspecified foot: Secondary | ICD-10-CM

## 2011-12-23 DIAGNOSIS — R030 Elevated blood-pressure reading, without diagnosis of hypertension: Secondary | ICD-10-CM

## 2011-12-23 DIAGNOSIS — R635 Abnormal weight gain: Secondary | ICD-10-CM

## 2011-12-23 MED ORDER — VITAMIN D 1000 UNITS PO TABS: 1000.0000 [IU] | ORAL_TABLET | Freq: Every day | ORAL | Status: DC

## 2011-12-23 NOTE — Assessment & Plan Note (Signed)
US

## 2011-12-23 NOTE — Assessment & Plan Note (Signed)
Phentermine

## 2011-12-23 NOTE — Progress Notes (Signed)
  Subjective:    Patient ID: Amber Reilly, female    DOB: 10/22/1965, 46 y.o.   MRN: [id number]  HPI  The patient presents for a follow-up of  chronic hypertension, chronic dyslipidemia, R foot injury C/o wt issue  Wt Readings from Last 3 Encounters:  12/23/11 216 lb (97.977 kg)  12/18/11 194 lb (87.998 kg)  08/20/11 205 lb (92.987 kg)    BP Readings from Last 3 Encounters:  12/23/11 120/80  12/18/11 109/74  08/20/11 140/100     Review of Systems  Constitutional: Positive for unexpected weight change. Negative for fever, chills, diaphoresis, activity change, appetite change and fatigue.  HENT: Negative for hearing loss, ear pain, congestion, sore throat, sneezing, mouth sores, neck pain, dental problem, voice change, postnasal drip and sinus pressure.   Eyes: Negative for pain and visual disturbance.  Respiratory: Negative for cough, chest tightness, wheezing and stridor.   Cardiovascular: Negative for chest pain, palpitations and leg swelling.  Gastrointestinal: Negative for nausea, vomiting, abdominal pain, blood in stool, abdominal distention and rectal pain.  Genitourinary: Negative for dysuria, hematuria, decreased urine volume, vaginal bleeding, vaginal discharge, difficulty urinating, vaginal pain and menstrual problem.  Musculoskeletal: Negative for back pain, joint swelling and gait problem.  Skin: Negative for color change, rash and wound.  Neurological: Negative for dizziness, tremors, syncope, speech difficulty and light-headedness.  Hematological: Negative for adenopathy.  Psychiatric/Behavioral: Negative for suicidal ideas, hallucinations, behavioral problems, confusion, disturbed wake/sleep cycle, dysphoric mood and decreased concentration. The patient is not nervous/anxious and is not hyperactive.        Objective:   Physical Exam  Constitutional: She appears well-developed. No distress.       Obese   HENT:  Head: Normocephalic.  Right Ear: External ear  normal.  Left Ear: External ear normal.  Nose: Nose normal.  Mouth/Throat: Oropharynx is clear and moist.  Eyes: Conjunctivae normal are normal. Pupils are equal, round, and reactive to light. Right eye exhibits no discharge. Left eye exhibits no discharge.  Neck: Normal range of motion. Neck supple. No JVD present. No tracheal deviation present. No thyromegaly present.  Cardiovascular: Normal rate, regular rhythm and normal heart sounds.   Pulmonary/Chest: No stridor. No respiratory distress. She has no wheezes.  Abdominal: Soft. Bowel sounds are normal. She exhibits no distension and no mass. There is no tenderness. There is no rebound and no guarding.  Musculoskeletal: She exhibits no edema and no tenderness.       R foot is in a boot  Lymphadenopathy:    She has no cervical adenopathy.  Neurological: She displays normal reflexes. No cranial nerve deficit. She exhibits normal muscle tone. Coordination normal.  Skin: No rash noted. No erythema.  Psychiatric: She has a normal mood and affect. Her behavior is normal. Judgment and thought content normal.   Lab Results  Component Value Date   WBC 4.0* 02/14/2010   HGB 12.2 02/14/2010   HCT 37.4 02/14/2010   PLT 227.0 02/14/2010   GLUCOSE 93 02/14/2010   CHOL 200 11/26/2006   TRIG 64 11/26/2006   HDL 46.9 11/26/2006   LDLCALC 140* 11/26/2006   ALT 17 07/21/2008   AST 20 07/21/2008   NA 138 02/14/2010   K 3.8 02/14/2010   CL 101 02/14/2010   CREATININE 0.9 02/14/2010   BUN 14 02/14/2010   CO2 28 02/14/2010   TSH 0.47 05/15/2011          Assessment & Plan:

## 2011-12-23 NOTE — Assessment & Plan Note (Signed)
In a cast boot

## 2011-12-23 NOTE — Assessment & Plan Note (Signed)
Continue with current prescription therapy as reflected on the Med list.  

## 2011-12-24 ENCOUNTER — Encounter: Payer: Self-pay | Admitting: Internal Medicine

## 2011-12-24 ENCOUNTER — Other Ambulatory Visit (INDEPENDENT_AMBULATORY_CARE_PROVIDER_SITE_OTHER): Payer: 59

## 2011-12-24 DIAGNOSIS — E049 Nontoxic goiter, unspecified: Secondary | ICD-10-CM

## 2011-12-24 DIAGNOSIS — R635 Abnormal weight gain: Secondary | ICD-10-CM

## 2011-12-24 DIAGNOSIS — M25571 Pain in right ankle and joints of right foot: Secondary | ICD-10-CM

## 2011-12-24 DIAGNOSIS — M25579 Pain in unspecified ankle and joints of unspecified foot: Secondary | ICD-10-CM

## 2011-12-24 DIAGNOSIS — R030 Elevated blood-pressure reading, without diagnosis of hypertension: Secondary | ICD-10-CM

## 2011-12-24 LAB — LIPID PANEL
Cholesterol: 193 mg/dL (ref 0–200)
HDL: 48 mg/dL (ref >39.00)
VLDL: 14.8 mg/dL (ref 0.0–40.0)

## 2011-12-24 LAB — CBC WITH DIFFERENTIAL/PLATELET
Basophils Absolute: 0 10*3/uL (ref 0.0–0.1)
Eosinophils Absolute: 0.2 10*3/uL (ref 0.0–0.7)
Lymphocytes Relative: 52.7 % — ABNORMAL HIGH (ref 12.0–46.0)
MCHC: 31.9 g/dL (ref 30.0–36.0)
MCV: 90.2 fl (ref 78.0–100.0)
Monocytes Absolute: 0.3 10*3/uL (ref 0.1–1.0)
Neutrophils Relative %: 33.3 % — ABNORMAL LOW (ref 43.0–77.0)
Platelets: 210 10*3/uL (ref 150.0–400.0)
RDW: 13.6 % (ref 11.5–14.6)

## 2011-12-24 LAB — URINALYSIS
Bilirubin Urine: NEGATIVE
Hgb urine dipstick: NEGATIVE
Ketones, ur: NEGATIVE
Nitrite: NEGATIVE
Total Protein, Urine: NEGATIVE
pH: 6 (ref 5.0–8.0)

## 2011-12-24 LAB — BASIC METABOLIC PANEL
BUN: 16 mg/dL (ref 6–23)
CO2: 27 mEq/L (ref 19–32)
Calcium: 8.6 mg/dL (ref 8.4–10.5)
Chloride: 104 mEq/L (ref 96–112)
Creatinine, Ser: 1.1 mg/dL (ref 0.4–1.2)

## 2011-12-24 LAB — HEPATIC FUNCTION PANEL
ALT: 16 U/L (ref 0–35)
AST: 18 U/L (ref 0–37)
Bilirubin, Direct: 0.1 mg/dL (ref 0.0–0.3)
Total Bilirubin: 0.5 mg/dL (ref 0.3–1.2)
Total Protein: 7 g/dL (ref 6.0–8.3)

## 2011-12-24 LAB — TSH: TSH: 0.9 u[IU]/mL (ref 0.35–5.50)

## 2011-12-25 ENCOUNTER — Encounter: Payer: Self-pay | Admitting: Sports Medicine

## 2011-12-25 ENCOUNTER — Ambulatory Visit (INDEPENDENT_AMBULATORY_CARE_PROVIDER_SITE_OTHER): Payer: 59 | Admitting: Sports Medicine

## 2011-12-25 ENCOUNTER — Ambulatory Visit
Admission: RE | Admit: 2011-12-25 | Discharge: 2011-12-25 | Disposition: A | Discharge status: Home / Self Care | Payer: 59 | Source: Ambulatory Visit | Attending: Internal Medicine | Admitting: Internal Medicine

## 2011-12-25 ENCOUNTER — Telehealth: Payer: Self-pay | Admitting: Internal Medicine

## 2011-12-25 VITALS — BP 131/80 | HR 72 | Temp 98.4°F | Wt 210.0 lb

## 2011-12-25 DIAGNOSIS — M25571 Pain in right ankle and joints of right foot: Secondary | ICD-10-CM

## 2011-12-25 DIAGNOSIS — M25579 Pain in unspecified ankle and joints of unspecified foot: Secondary | ICD-10-CM

## 2011-12-25 NOTE — Assessment & Plan Note (Signed)
Symptoms resolved with simple immobilization. She may continue out of the [employer]. Continue compression wrap. Continue rehabilitation exercises. I will see her back in 3 weeks, and if the cyst is still present, and symptomatic we will aspirate and inject.

## 2011-12-25 NOTE — Telephone Encounter (Signed)
Amber Reilly, please, inform patient that all labs are normal except for a very slight anemia Thx

## 2011-12-25 NOTE — Progress Notes (Signed)
Subjective:    CC: Followup ankle sprain  HPI: Amber Reilly comes back to see [name] about a week after a severe inversion injury to her right ankle. She developed tibialis posterior tendinitis, as well as a dorsal tibiotalar ganglion cyst. I placed her in a Cam Boot, and have her do some anti-inflammatories, and rehabilitation exercises. She comes back today almost completely better. She still has localized pain over the dorsal tibiotalar joint, or tibialis posterior pain has resolved. She has discontinued the [facility], and is in regular shoe with compression wrap.  Past medical history, Surgical history, Family history, Social history, Allergies, and medications have been entered into the medical record, reviewed, and no changes needed.   Review of Systems: No fevers, chills, night sweats, weight loss, chest pain, or shortness of breath.   Objective:    General: Well Developed, well nourished, and in no acute distress.  Neuro: Alert and oriented x3, extra-ocular muscles intact.  HEENT: Normocephalic, atraumatic, pupils equal round reactive to light, neck supple, no masses, no lymphadenopathy, thyroid nonpalpable.  Skin: Warm and dry, no rashes. Cardiac: Regular rate and rhythm, no murmurs rubs or gallops.  Respiratory: Clear to auscultation bilaterally. Not using accessory muscles, speaking in full sentences. Right Ankle: Dorsal tibiotalar ganglion still visible. This is also minimally tender to palpation. Range of motion is full in all directions. Strength is 5/5 in all directions. Stable lateral and medial ligaments; squeeze test and kleiger test unremarkable; Talar dome nontender; No pain at base of 5th MT; No tenderness over cuboid; No tenderness over N spot or navicular prominence No tenderness on posterior aspects of lateral and medial malleolus No sign of peroneal tendon subluxations or tenderness to palpation Negative tarsal tunnel tinel's Able to walk 4 steps.  Impression and  Recommendations:

## 2011-12-25 NOTE — Telephone Encounter (Signed)
Left detailed mess informing pt of below.  

## 2011-12-29 ENCOUNTER — Telehealth: Payer: Self-pay | Admitting: Internal Medicine

## 2011-12-29 DIAGNOSIS — E041 Nontoxic single thyroid nodule: Secondary | ICD-10-CM

## 2011-12-29 NOTE — Telephone Encounter (Signed)
Amber Reilly, please, inform patient that her thyroid US is showing nodules. Radiologist has rec Bx Is it ok to sch? Thx

## 2011-12-30 NOTE — Telephone Encounter (Signed)
Pt informed. Ok to schedule.

## 2011-12-31 NOTE — Telephone Encounter (Signed)
done

## 2012-01-01 ENCOUNTER — Other Ambulatory Visit: Payer: Self-pay | Admitting: *Deleted

## 2012-01-01 ENCOUNTER — Other Ambulatory Visit: Payer: Self-pay | Admitting: Internal Medicine

## 2012-01-01 DIAGNOSIS — E041 Nontoxic single thyroid nodule: Secondary | ICD-10-CM

## 2012-01-08 ENCOUNTER — Ambulatory Visit
Admission: RE | Admit: 2012-01-08 | Discharge: 2012-01-08 | Disposition: A | Discharge status: Home / Self Care | Payer: 59 | Source: Ambulatory Visit | Attending: Internal Medicine | Admitting: Internal Medicine

## 2012-01-08 ENCOUNTER — Other Ambulatory Visit (HOSPITAL_COMMUNITY)
Admission: RE | Admit: 2012-01-08 | Discharge: 2012-01-08 | Disposition: A | Discharge status: Home / Self Care | Payer: 59 | Source: Ambulatory Visit | Attending: Interventional Radiology | Admitting: Interventional Radiology

## 2012-01-08 DIAGNOSIS — E049 Nontoxic goiter, unspecified: Secondary | ICD-10-CM | POA: Insufficient documentation

## 2012-01-08 DIAGNOSIS — E041 Nontoxic single thyroid nodule: Secondary | ICD-10-CM

## 2012-01-12 ENCOUNTER — Telehealth: Payer: Self-pay | Admitting: Internal Medicine

## 2012-01-12 NOTE — Telephone Encounter (Signed)
Stacey, please, inform patient that the bx was benign Thx  

## 2012-01-13 NOTE — Telephone Encounter (Signed)
Left detailed mess informing pt of below.  

## 2012-01-14 ENCOUNTER — Other Ambulatory Visit: Payer: Self-pay | Admitting: *Deleted

## 2012-01-14 NOTE — Telephone Encounter (Signed)
C/o vaginal discharge. She is requesting Rx for this. She is also requesting doxycycline for her acne on her face. Please advise Pt is aware AVP is out of office until tomorrow 01/15/12.

## 2012-01-15 MED ORDER — DOXYCYCLINE HYCLATE 100 MG PO TABS: 100.0000 mg | ORAL_TABLET | Freq: Two times a day (BID) | ORAL | Status: DC

## 2012-01-15 MED ORDER — METRONIDAZOLE 0.75 % VA GEL: 1.0000 | Freq: Two times a day (BID) | VAGINAL | Status: DC

## 2012-01-15 NOTE — Telephone Encounter (Signed)
Done. Pt informed.

## 2012-01-15 NOTE — Telephone Encounter (Signed)
OK doxy and metrogel vag Thx

## 2012-01-16 ENCOUNTER — Ambulatory Visit: Payer: 59 | Admitting: Sports Medicine

## 2012-01-27 ENCOUNTER — Encounter: Payer: Self-pay | Admitting: Sports Medicine

## 2012-01-27 ENCOUNTER — Encounter: Payer: Self-pay | Admitting: *Deleted

## 2012-01-27 ENCOUNTER — Ambulatory Visit (INDEPENDENT_AMBULATORY_CARE_PROVIDER_SITE_OTHER): Payer: 59 | Admitting: Sports Medicine

## 2012-01-27 VITALS — BP 121/76 | HR 82 | Wt 214.0 lb

## 2012-01-27 DIAGNOSIS — Z23 Encounter for immunization: Secondary | ICD-10-CM

## 2012-01-27 DIAGNOSIS — Z298 Encounter for other specified prophylactic measures: Secondary | ICD-10-CM

## 2012-01-27 DIAGNOSIS — M25571 Pain in right ankle and joints of right foot: Secondary | ICD-10-CM

## 2012-01-27 DIAGNOSIS — M76829 Posterior tibial tendinitis, unspecified leg: Secondary | ICD-10-CM

## 2012-01-27 NOTE — Progress Notes (Signed)
Subjective:    CC: Followup right ankle pain  HPI: Verenice comes back to see me about a month after her initial ankle injury. She developed some ligamentous sprains, tibialis posterior tendinitis, as well as a mass on the anterior tibiotalar joint line. This was initially thought to be a dorsal ganglion cyst. She's been for short course of Cam Boot immobilization, home rehabilitation, as well as NSAIDs.  Overall her pain and swelling is nearly completely resolved, however unfortunately she is continued to have the dorsal tibiotalar mass. The initial plan, with she failed conservative therapy to return for aspiration and injection of the cyst.  Past medical history, Surgical history, Family history, Social history, Allergies, and medications have been entered into the medical record, reviewed, and no changes needed.   Review of Systems: No headache, visual changes, nausea, vomiting, diarrhea, constipation, dizziness, abdominal pain, skin rash, fevers, chills, night sweats, weight loss, swollen lymph nodes, body aches, joint swelling, muscle aches, chest pain, or shortness of breath.   Objective:   Vitals:  Afebrile, vital signs stable. General: Well Developed, well nourished, and in no acute distress.  Neuro/Psych: Alert and oriented x3, extra-ocular muscles intact, able to move all 4 extremities.  Skin: Warm and dry, no rashes noted.  Respiratory: Not using accessory muscles, speaking in full sentences, trachea midline.  Cardiovascular: Pulses palpable, no extremity edema. Abdomen: Does not appear distended. Right Ankle: There is a 2-3 cm movable, palpable, nodular mass at the dorsal tibiotalar joint. This feels similar to a ganglion cyst. It is tender to palpation. Range of motion is full in all directions. Strength is 5/5 in all directions.  Particularly with ankle dorsiflexion. Stable lateral and medial ligaments; squeeze test and kleiger test unremarkable; Talar dome nontender; No pain at  base of 5th MT; No tenderness over cuboid; No tenderness over N spot or navicular prominence There's tenderness to palpation behind the medial malleolus, with reproduction of pain with resisted foot inversion. No sign of peroneal tendon subluxations or tenderness to palpation Negative tarsal tunnel tinel's  Procedure: Real-time Ultrasound Guided Injection of right tibialis anterior tendon sheath. Device: GE Logiq E  Ultrasound guided injection is preferred based studies that show increased duration, increased effect, greater accuracy, decreased procedural pain, increased response rate, and decreased cost with ultrasound guided versus blind injection.  Verbal informed consent obtained.  Time-out conducted.  Noted no overlying erythema, induration, or other signs of local infection.  Skin prepped in a sterile fashion.  Local anesthesia: Topical Ethyl chloride.  With sterile technique and under real time ultrasound guidance:  Under ultrasound, the dorsal mass did not appear to be a ganglion cyst. It does not hypoechoic. It did appear to represent thickening, and tendinosis of the tibialis anterior tendon. I followed it proximally to the muscle belly, and distally to its insertion under the foot. It did not appear toward, or retracted.  The tendon fascicles appeared very disorganized. There was also fluid in the tendon sheath itself. A 25-gauge needle was directed into the tendon sheath, and 1 cc of Kenalog 40, 3 cc of lidocaine were dissected around the tendon sheath. The needle was withdrawn and a sterile dressing applied. Completed without difficulty  Pain immediately resolved suggesting accurate placement of the medication.  Advised to call if fevers/chills, erythema, induration, drainage, or persistent bleeding.  Images permanently stored and available for review in the ultrasound unit.  Impression: Technically successful ultrasound guided injection.   Impression and Recommendations:   This  case required medical  decision making of moderate complexity.

## 2012-01-27 NOTE — Assessment & Plan Note (Addendum)
Interestingly, the anterior tibiotalar joint mass did not represent hypoechoic ganglion cyst, it was in fact tendinosis of the tibialis anterior tendon. Her ankle dorsiflexion strength is excellent, indicating that this does not represent a complete tear of the tibialis anterior. Failed conservative treatment for almost 4 weeks. Ultrasound guided tibialis anterior tendon sheath injection. Formal physical therapy. Topical modalities. Back to see [name] in 4 weeks.

## 2012-01-30 ENCOUNTER — Ambulatory Visit: Payer: 59 | Attending: Sports Medicine | Admitting: Physical Therapy

## 2012-01-30 DIAGNOSIS — M25673 Stiffness of unspecified ankle, not elsewhere classified: Secondary | ICD-10-CM | POA: Insufficient documentation

## 2012-01-30 DIAGNOSIS — M6281 Muscle weakness (generalized): Secondary | ICD-10-CM | POA: Insufficient documentation

## 2012-01-30 DIAGNOSIS — M25579 Pain in unspecified ankle and joints of unspecified foot: Secondary | ICD-10-CM | POA: Insufficient documentation

## 2012-01-30 DIAGNOSIS — M25676 Stiffness of unspecified foot, not elsewhere classified: Secondary | ICD-10-CM | POA: Insufficient documentation

## 2012-01-30 DIAGNOSIS — IMO0001 Reserved for inherently not codable concepts without codable children: Secondary | ICD-10-CM | POA: Insufficient documentation

## 2012-01-30 DIAGNOSIS — R262 Difficulty in walking, not elsewhere classified: Secondary | ICD-10-CM | POA: Insufficient documentation

## 2012-02-03 ENCOUNTER — Telehealth: Payer: Self-pay

## 2012-02-03 ENCOUNTER — Ambulatory Visit: Payer: 59 | Admitting: Physical Therapy

## 2012-02-03 NOTE — Telephone Encounter (Signed)
Patient called requesting medication refill on phentermine. Medication last filled 12/16/11 and patient last seen 12/23/11, please advise if ok

## 2012-02-03 NOTE — Telephone Encounter (Signed)
OK to fill this prescription with additional refills x1 Thank you!  

## 2012-02-04 MED ORDER — PHENTERMINE HCL 37.5 MG PO TABS: 37.5000 mg | ORAL_TABLET | Freq: Every day | ORAL | Status: DC

## 2012-02-04 NOTE — Telephone Encounter (Signed)
Patient notified and rx called into pharmacy.

## 2012-02-06 ENCOUNTER — Ambulatory Visit: Payer: 59 | Admitting: Physical Therapy

## 2012-02-09 ENCOUNTER — Encounter: Payer: 59 | Admitting: Physical Therapy

## 2012-02-13 ENCOUNTER — Ambulatory Visit: Payer: 59 | Admitting: Physical Therapy

## 2012-02-16 ENCOUNTER — Encounter: Payer: 59 | Admitting: Physical Therapy

## 2012-02-20 ENCOUNTER — Encounter: Payer: 59 | Admitting: Physical Therapy

## 2012-02-24 ENCOUNTER — Ambulatory Visit: Payer: 59 | Admitting: Sports Medicine

## 2012-03-02 ENCOUNTER — Ambulatory Visit: Payer: 59 | Admitting: Sports Medicine

## 2012-03-02 DIAGNOSIS — Z0289 Encounter for other administrative examinations: Secondary | ICD-10-CM

## 2012-05-17 ENCOUNTER — Ambulatory Visit (INDEPENDENT_AMBULATORY_CARE_PROVIDER_SITE_OTHER): Payer: Self-pay | Admitting: Internal Medicine

## 2012-05-17 ENCOUNTER — Encounter: Payer: Self-pay | Admitting: Internal Medicine

## 2012-05-17 VITALS — BP 130/82 | HR 86 | Temp 98.2°F | Resp 16 | Wt 220.0 lb

## 2012-05-17 DIAGNOSIS — M25571 Pain in right ankle and joints of right foot: Secondary | ICD-10-CM

## 2012-05-17 DIAGNOSIS — R030 Elevated blood-pressure reading, without diagnosis of hypertension: Secondary | ICD-10-CM

## 2012-05-17 DIAGNOSIS — M25579 Pain in unspecified ankle and joints of unspecified foot: Secondary | ICD-10-CM

## 2012-05-17 DIAGNOSIS — F329 Major depressive disorder, single episode, unspecified: Secondary | ICD-10-CM

## 2012-05-17 DIAGNOSIS — R238 Other skin changes: Secondary | ICD-10-CM

## 2012-05-17 DIAGNOSIS — L819 Disorder of pigmentation, unspecified: Secondary | ICD-10-CM

## 2012-05-17 DIAGNOSIS — R635 Abnormal weight gain: Secondary | ICD-10-CM

## 2012-05-17 MED ORDER — LOSARTAN POTASSIUM-HCTZ 100-12.5 MG PO TABS: 1.0000 | ORAL_TABLET | Freq: Every day | ORAL | Status: DC

## 2012-05-17 MED ORDER — PHENTERMINE HCL 37.5 MG PO TABS: 37.5000 mg | ORAL_TABLET | Freq: Every day | ORAL | Status: DC

## 2012-05-17 NOTE — Assessment & Plan Note (Signed)
Continue with current prescription therapy as reflected on the Med list.  

## 2012-05-17 NOTE — Assessment & Plan Note (Signed)
F/u w/Dr T 

## 2012-05-17 NOTE — Assessment & Plan Note (Signed)
Phentermine

## 2012-05-17 NOTE — Progress Notes (Signed)
Subjective:    HPI C/o skin discoloration of the R front ankle - dark/light and liight discoloration up to anterior proximal  1/3 of the shin - "river like"; NT x 2 mo. R foot pain has resolved. There as a mild swelling The patient presents for a follow-up of  chronic hypertension, chronic dyslipidemia, R foot injury C/o wt issue  Wt Readings from Last 3 Encounters:  05/17/12 220 lb (99.791 kg)  01/27/12 214 lb (97.07 kg)  12/25/11 210 lb (95.255 kg)    BP Readings from Last 3 Encounters:  05/17/12 130/82  01/27/12 121/76  12/25/11 131/80     Review of Systems  Constitutional: Positive for unexpected weight change. Negative for fever, chills, diaphoresis, activity change, appetite change and fatigue.  HENT: Negative for hearing loss, ear pain, congestion, sore throat, sneezing, mouth sores, neck pain, dental problem, voice change, postnasal drip and sinus pressure.   Eyes: Negative for pain and visual disturbance.  Respiratory: Negative for cough, chest tightness, wheezing and stridor.   Cardiovascular: Negative for chest pain, palpitations and leg swelling.  Gastrointestinal: Negative for nausea, vomiting, abdominal pain, blood in stool, abdominal distention and rectal pain.  Genitourinary: Negative for dysuria, hematuria, decreased urine volume, vaginal bleeding, vaginal discharge, difficulty urinating, vaginal pain and menstrual problem.  Musculoskeletal: Negative for back pain, joint swelling and gait problem.  Skin: Negative for color change, rash and wound.  Neurological: Negative for dizziness, tremors, syncope, speech difficulty and light-headedness.  Hematological: Negative for adenopathy.  Psychiatric/Behavioral: Negative for suicidal ideas, hallucinations, behavioral problems, confusion, sleep disturbance, dysphoric mood and decreased concentration. The patient is not nervous/anxious and is not hyperactive.        Objective:   Physical Exam  Constitutional: She  appears well-developed. No distress.  Obese   HENT:  Head: Normocephalic.  Right Ear: External ear normal.  Left Ear: External ear normal.  Nose: Nose normal.  Mouth/Throat: Oropharynx is clear and moist.  Eyes: Conjunctivae are normal. Pupils are equal, round, and reactive to light. Right eye exhibits no discharge. Left eye exhibits no discharge.  Neck: Normal range of motion. Neck supple. No JVD present. No tracheal deviation present. No thyromegaly present.  Cardiovascular: Normal rate, regular rhythm and normal heart sounds.   Pulmonary/Chest: No stridor. No respiratory distress. She has no wheezes.  Abdominal: Soft. Bowel sounds are normal. She exhibits no distension and no mass. There is no tenderness. There is no rebound and no guarding.  Musculoskeletal: She exhibits no edema and no tenderness.  Lymphadenopathy:    She has no cervical adenopathy.  Neurological: She displays normal reflexes. No cranial nerve deficit. She exhibits normal muscle tone. Coordination normal.  Skin: No rash noted. No erythema.  Psychiatric: She has a normal mood and affect. Her behavior is normal. Judgment and thought content normal.  R front ankle - dark/light patches and light discoloration up to anterior proximal  1/3 of the shin - "river like"depigmentation to the middle; NT  Lab Results  Component Value Date   WBC 3.6* 12/24/2011   HGB 11.5* 12/24/2011   HCT 36.2 12/24/2011   PLT 210.0 12/24/2011   GLUCOSE 90 12/24/2011   CHOL 193 12/24/2011   TRIG 74.0 12/24/2011   HDL 48.00 12/24/2011   LDLCALC 130* 12/24/2011   ALT 16 12/24/2011   AST 18 12/24/2011   NA 139 12/24/2011   K 3.6 12/24/2011   CL 104 12/24/2011   CREATININE 1.1 12/24/2011   BUN 16 12/24/2011   CO2  27 12/24/2011   TSH 0.90 12/24/2011          Assessment & Plan:

## 2012-05-19 ENCOUNTER — Encounter: Payer: Self-pay | Admitting: Internal Medicine

## 2012-05-19 DIAGNOSIS — L819 Disorder of pigmentation, unspecified: Secondary | ICD-10-CM | POA: Insufficient documentation

## 2012-05-19 NOTE — Assessment & Plan Note (Signed)
2/14 R ankle and R dist anter shin - post-traumatic.  No acute process is noted. Derm consult was offered

## 2012-10-05 ENCOUNTER — Other Ambulatory Visit: Payer: Self-pay

## 2012-10-05 MED ORDER — HYDROCODONE-ACETAMINOPHEN 5-325 MG PO TABS: 1.0000 | ORAL_TABLET | Freq: Two times a day (BID) | ORAL | Status: DC | PRN

## 2012-10-05 NOTE — Telephone Encounter (Signed)
Ok #20 OV if not better Thx

## 2012-10-05 NOTE — Telephone Encounter (Signed)
RX called in and pt notified.

## 2012-10-05 NOTE — Telephone Encounter (Signed)
Patient called lmov[facility] co back pian x last week. She would like a Rx to help with pain and states that Vicodin usually helps. Thanks

## 2012-11-15 DIAGNOSIS — S83282A Other tear of lateral meniscus, current injury, left knee, initial encounter: Secondary | ICD-10-CM | POA: Insufficient documentation

## 2012-11-18 HISTORY — PX: KNEE ARTHROSCOPY: SHX127

## 2012-12-08 ENCOUNTER — Encounter: Payer: Self-pay | Admitting: Internal Medicine

## 2012-12-08 ENCOUNTER — Ambulatory Visit (INDEPENDENT_AMBULATORY_CARE_PROVIDER_SITE_OTHER): Payer: BC Managed Care – PPO | Admitting: Internal Medicine

## 2012-12-08 VITALS — BP 110/60 | HR 76 | Temp 98.6°F | Resp 16 | Wt 207.0 lb

## 2012-12-08 DIAGNOSIS — F329 Major depressive disorder, single episode, unspecified: Secondary | ICD-10-CM

## 2012-12-08 DIAGNOSIS — R7989 Other specified abnormal findings of blood chemistry: Secondary | ICD-10-CM

## 2012-12-08 DIAGNOSIS — M25519 Pain in unspecified shoulder: Secondary | ICD-10-CM

## 2012-12-08 DIAGNOSIS — R6889 Other general symptoms and signs: Secondary | ICD-10-CM

## 2012-12-08 DIAGNOSIS — R030 Elevated blood-pressure reading, without diagnosis of hypertension: Secondary | ICD-10-CM

## 2012-12-08 DIAGNOSIS — R635 Abnormal weight gain: Secondary | ICD-10-CM

## 2012-12-08 DIAGNOSIS — M25511 Pain in right shoulder: Secondary | ICD-10-CM

## 2012-12-08 MED ORDER — OXYCODONE-ACETAMINOPHEN 5-325 MG PO TABS: 1.0000 | ORAL_TABLET | Freq: Three times a day (TID) | ORAL | Status: DC | PRN

## 2012-12-08 MED ORDER — IBUPROFEN 600 MG PO TABS: 600.0000 mg | ORAL_TABLET | Freq: Two times a day (BID) | ORAL | Status: DC | PRN

## 2012-12-08 NOTE — Progress Notes (Signed)
Subjective:    HPI C/o dizziness, LBP, R shoulder pain C/o L ankle fx - remote in 5/14. Just had a meniscal repair on  L knee 3 wks ago. f/u skin discoloration of the R front ankle - dark/light and liight discoloration up to anterior proximal  1/3 of the shin - "river like"; NT x 2 mo. R foot pain has resolved. There as a mild swelling The patient presents for a follow-up of  chronic hypertension, chronic dyslipidemia, R foot injury C/o wt issue  Wt Readings from Last 3 Encounters:  12/08/12 207 lb (93.895 kg)  05/17/12 220 lb (99.791 kg)  01/27/12 214 lb (97.07 kg)    BP Readings from Last 3 Encounters:  12/08/12 110/60  05/17/12 130/82  01/27/12 121/76     Review of Systems  Constitutional: Positive for unexpected weight change. Negative for fever, chills, diaphoresis, activity change, appetite change and fatigue.  HENT: Negative for hearing loss, ear pain, congestion, sore throat, sneezing, mouth sores, neck pain, dental problem, voice change, postnasal drip and sinus pressure.   Eyes: Negative for pain and visual disturbance.  Respiratory: Negative for cough, chest tightness, wheezing and stridor.   Cardiovascular: Negative for chest pain, palpitations and leg swelling.  Gastrointestinal: Negative for nausea, vomiting, abdominal pain, blood in stool, abdominal distention and rectal pain.  Genitourinary: Negative for dysuria, hematuria, decreased urine volume, vaginal bleeding, vaginal discharge, difficulty urinating, vaginal pain and menstrual problem.  Musculoskeletal: Negative for back pain, joint swelling and gait problem.  Skin: Negative for color change, rash and wound.  Neurological: Negative for dizziness, tremors, syncope, speech difficulty and light-headedness.  Hematological: Negative for adenopathy.  Psychiatric/Behavioral: Negative for suicidal ideas, hallucinations, behavioral problems, confusion, sleep disturbance, dysphoric mood and decreased concentration.  The patient is not nervous/anxious and is not hyperactive.        Objective:   Physical Exam  Constitutional: She appears well-developed. No distress.  Obese   HENT:  Head: Normocephalic.  Right Ear: External ear normal.  Left Ear: External ear normal.  Nose: Nose normal.  Mouth/Throat: Oropharynx is clear and moist.  Eyes: Conjunctivae are normal. Pupils are equal, round, and reactive to light. Right eye exhibits no discharge. Left eye exhibits no discharge.  Neck: Normal range of motion. Neck supple. No JVD present. No tracheal deviation present. No thyromegaly present.  Cardiovascular: Normal rate, regular rhythm and normal heart sounds.   Pulmonary/Chest: No stridor. No respiratory distress. She has no wheezes.  Abdominal: Soft. Bowel sounds are normal. She exhibits no distension and no mass. There is no tenderness. There is no rebound and no guarding.  Musculoskeletal: She exhibits no edema and no tenderness.  Lymphadenopathy:    She has no cervical adenopathy.  Neurological: She displays normal reflexes. No cranial nerve deficit. She exhibits normal muscle tone. Coordination normal.  Skin: No rash noted. No erythema.  Psychiatric: She has a normal mood and affect. Her behavior is normal. Judgment and thought content normal.  Limp R shoulder is tender R front ankle - dark/light patches and light discoloration up to anterior proximal  1/3 of the shin - "river like"depigmentation to the middle; NT  Lab Results  Component Value Date   WBC 3.6* 12/24/2011   HGB 11.5* 12/24/2011   HCT 36.2 12/24/2011   PLT 210.0 12/24/2011   GLUCOSE 90 12/24/2011   CHOL 193 12/24/2011   TRIG 74.0 12/24/2011   HDL 48.00 12/24/2011   LDLCALC 130* 12/24/2011   ALT 16 12/24/2011  AST 18 12/24/2011   NA 139 12/24/2011   K 3.6 12/24/2011   CL 104 12/24/2011   CREATININE 1.1 12/24/2011   BUN 16 12/24/2011   CO2 27 12/24/2011   TSH 0.90 12/24/2011          Assessment & Plan:

## 2012-12-09 DIAGNOSIS — M25519 Pain in unspecified shoulder: Secondary | ICD-10-CM | POA: Insufficient documentation

## 2012-12-09 MED ORDER — VITAMIN D 1000 UNITS PO TABS: 1000.0000 [IU] | ORAL_TABLET | Freq: Every day | ORAL | Status: AC

## 2012-12-09 NOTE — Assessment & Plan Note (Signed)
Resolved

## 2012-12-09 NOTE — Assessment & Plan Note (Signed)
Better  

## 2012-12-09 NOTE — Assessment & Plan Note (Signed)
Lab Results  Component Value Date   TSH 0.90 12/24/2011  Repeat labs

## 2012-12-09 NOTE — Assessment & Plan Note (Signed)
Due to use of crutches - MSK

## 2012-12-09 NOTE — Assessment & Plan Note (Signed)
BP Readings from Last 3 Encounters:  12/08/12 110/60  05/17/12 130/82  01/27/12 121/76

## 2012-12-23 ENCOUNTER — Other Ambulatory Visit: Payer: Self-pay | Admitting: Internal Medicine

## 2012-12-23 ENCOUNTER — Encounter: Payer: Self-pay | Admitting: Internal Medicine

## 2012-12-23 MED ORDER — OXYCODONE-ACETAMINOPHEN 5-325 MG PO TABS: 1.0000 | ORAL_TABLET | Freq: Three times a day (TID) | ORAL | Status: DC | PRN

## 2013-02-03 ENCOUNTER — Other Ambulatory Visit: Payer: Self-pay

## 2013-02-13 DIAGNOSIS — T8484XA Pain due to internal orthopedic prosthetic devices, implants and grafts, initial encounter: Secondary | ICD-10-CM | POA: Insufficient documentation

## 2013-02-15 DIAGNOSIS — S82899A Other fracture of unspecified lower leg, initial encounter for closed fracture: Secondary | ICD-10-CM | POA: Insufficient documentation

## 2013-02-15 HISTORY — PX: ANKLE HARDWARE REMOVAL: SHX1149

## 2013-03-14 ENCOUNTER — Other Ambulatory Visit: Payer: Self-pay | Admitting: Internal Medicine

## 2013-03-15 ENCOUNTER — Ambulatory Visit (INDEPENDENT_AMBULATORY_CARE_PROVIDER_SITE_OTHER): Payer: Self-pay | Admitting: Internal Medicine

## 2013-03-15 ENCOUNTER — Encounter: Payer: Self-pay | Admitting: Internal Medicine

## 2013-03-15 VITALS — BP 140/90 | HR 80 | Temp 98.8°F | Resp 16 | Wt 212.0 lb

## 2013-03-15 DIAGNOSIS — R635 Abnormal weight gain: Secondary | ICD-10-CM

## 2013-03-15 DIAGNOSIS — M25519 Pain in unspecified shoulder: Secondary | ICD-10-CM

## 2013-03-15 DIAGNOSIS — M25511 Pain in right shoulder: Secondary | ICD-10-CM

## 2013-03-15 DIAGNOSIS — N760 Acute vaginitis: Secondary | ICD-10-CM | POA: Insufficient documentation

## 2013-03-15 MED ORDER — FLUCONAZOLE 150 MG PO TABS: 150.0000 mg | ORAL_TABLET | Freq: Once | ORAL | Status: DC

## 2013-03-15 MED ORDER — OXYCODONE-ACETAMINOPHEN 5-325 MG PO TABS: 1.0000 | ORAL_TABLET | Freq: Three times a day (TID) | ORAL | Status: DC | PRN

## 2013-03-15 MED ORDER — MELOXICAM 15 MG PO TABS: 15.0000 mg | ORAL_TABLET | Freq: Every day | ORAL | Status: DC

## 2013-03-15 NOTE — Progress Notes (Signed)
Pre visit review using our clinic review tool, if applicable. No additional management support is needed unless otherwise documented below in the visit note. 

## 2013-03-15 NOTE — Progress Notes (Signed)
Subjective:    HPI C/o dizziness, LBP, R shoulder pain C/o yeast infection F/u L ankle fx - remote in 5/14. Screws and plate were removed on Feb 15 2013 Just had a meniscal repair on  L knee 3 wks ago. f/u skin discoloration of the R front ankle - dark/light and liight discoloration up to anterior proximal  1/3 of the shin - "river like"; NT x 2 mo. R foot pain has resolved. There as a mild swelling The patient presents for a follow-up of  chronic hypertension, chronic dyslipidemia, R foot injury C/o wt issue  Wt Readings from Last 3 Encounters:  03/15/13 212 lb (96.163 kg)  12/08/12 207 lb (93.895 kg)  05/17/12 220 lb (99.791 kg)    BP Readings from Last 3 Encounters:  03/15/13 140/90  12/08/12 110/60  05/17/12 130/82     Review of Systems  Constitutional: Positive for unexpected weight change. Negative for fever, chills, diaphoresis, activity change, appetite change and fatigue.  HENT: Negative for congestion, dental problem, ear pain, hearing loss, mouth sores, postnasal drip, sinus pressure, sneezing, sore throat and voice change.   Eyes: Negative for pain and visual disturbance.  Respiratory: Negative for cough, chest tightness, wheezing and stridor.   Cardiovascular: Negative for chest pain, palpitations and leg swelling.  Gastrointestinal: Negative for nausea, vomiting, abdominal pain, blood in stool, abdominal distention and rectal pain.  Genitourinary: Negative for dysuria, hematuria, decreased urine volume, vaginal bleeding, vaginal discharge, difficulty urinating, vaginal pain and menstrual problem.  Musculoskeletal: Negative for back pain, gait problem, joint swelling and neck pain.  Skin: Negative for color change, rash and wound.  Neurological: Negative for dizziness, tremors, syncope, speech difficulty and light-headedness.  Hematological: Negative for adenopathy.  Psychiatric/Behavioral: Negative for suicidal ideas, hallucinations, behavioral problems,  confusion, sleep disturbance, dysphoric mood and decreased concentration. The patient is not nervous/anxious and is not hyperactive.        Objective:   Physical Exam  Constitutional: She appears well-developed. No distress.  Obese   HENT:  Head: Normocephalic.  Right Ear: External ear normal.  Left Ear: External ear normal.  Nose: Nose normal.  Mouth/Throat: Oropharynx is clear and moist.  Eyes: Conjunctivae are normal. Pupils are equal, round, and reactive to light. Right eye exhibits no discharge. Left eye exhibits no discharge.  Neck: Normal range of motion. Neck supple. No JVD present. No tracheal deviation present. No thyromegaly present.  Cardiovascular: Normal rate, regular rhythm and normal heart sounds.   Pulmonary/Chest: No stridor. No respiratory distress. She has no wheezes.  Abdominal: Soft. Bowel sounds are normal. She exhibits no distension and no mass. There is no tenderness. There is no rebound and no guarding.  Musculoskeletal: She exhibits no edema and no tenderness.  Lymphadenopathy:    She has no cervical adenopathy.  Neurological: She displays normal reflexes. No cranial nerve deficit. She exhibits normal muscle tone. Coordination normal.  Skin: No rash noted. No erythema.  Psychiatric: She has a normal mood and affect. Her behavior is normal. Judgment and thought content normal.  Limping less R shoulder is very tender w/palp and ROM; painful R biceps tendon and AC joint R front ankle - dark/light patches and light discoloration up to anterior proximal  1/3 of the shin - "river like"depigmentation to the middle; NT  Lab Results  Component Value Date   WBC 3.6* 12/24/2011   HGB 11.5* 12/24/2011   HCT 36.2 12/24/2011   PLT 210.0 12/24/2011   GLUCOSE 90 12/24/2011   CHOL  193 12/24/2011   TRIG 74.0 12/24/2011   HDL 48.00 12/24/2011   LDLCALC 130* 12/24/2011   ALT 16 12/24/2011   AST 18 12/24/2011   NA 139 12/24/2011   K 3.6 12/24/2011   CL 104 12/24/2011   CREATININE  1.1 12/24/2011   BUN 16 12/24/2011   CO2 27 12/24/2011   TSH 0.90 12/24/2011          Assessment & Plan:

## 2013-03-16 DIAGNOSIS — M25511 Pain in right shoulder: Secondary | ICD-10-CM | POA: Insufficient documentation

## 2013-03-16 NOTE — Assessment & Plan Note (Signed)
Discussed diet  

## 2013-03-16 NOTE — Assessment & Plan Note (Signed)
Diflucan

## 2013-03-16 NOTE — Assessment & Plan Note (Signed)
R shoulder is very tender w/palp and ROM; painful R biceps tendon and AC joint  Meloxicam Percocet prn ROM exercises Pt declined PT and injection option

## 2013-04-05 ENCOUNTER — Ambulatory Visit (INDEPENDENT_AMBULATORY_CARE_PROVIDER_SITE_OTHER): Payer: BC Managed Care – PPO | Admitting: Internal Medicine

## 2013-04-05 ENCOUNTER — Encounter: Payer: Self-pay | Admitting: Internal Medicine

## 2013-04-05 VITALS — BP 130/90 | HR 80 | Temp 98.7°F | Resp 16 | Ht 65.0 in | Wt 213.0 lb

## 2013-04-05 DIAGNOSIS — Z0000 Encounter for general adult medical examination without abnormal findings: Secondary | ICD-10-CM | POA: Insufficient documentation

## 2013-04-05 DIAGNOSIS — I10 Essential (primary) hypertension: Secondary | ICD-10-CM

## 2013-04-05 DIAGNOSIS — N951 Menopausal and female climacteric states: Secondary | ICD-10-CM

## 2013-04-05 DIAGNOSIS — IMO0001 Reserved for inherently not codable concepts without codable children: Secondary | ICD-10-CM

## 2013-04-05 DIAGNOSIS — R030 Elevated blood-pressure reading, without diagnosis of hypertension: Secondary | ICD-10-CM

## 2013-04-05 MED ORDER — OXYCODONE-ACETAMINOPHEN 5-325 MG PO TABS: 1.0000 | ORAL_TABLET | Freq: Three times a day (TID) | ORAL | Status: DC | PRN

## 2013-04-05 NOTE — Assessment & Plan Note (Signed)
Continue with current prescription therapy as reflected on the Med list.  

## 2013-04-05 NOTE — Progress Notes (Signed)
Subjective:    HPI  The patient is here for a wellness exam. The patient has been doing well overall without major physical or psychological issues going on lately.  She lost her Percocet paper Rx - never got it F/u dizziness, LBP, R shoulder pain F/u yeast infection F/u L ankle fx - remote in 5/14. Screws and plate were removed on Feb 15 2013 Just had a meniscal repair on  L knee 3 wks ago. f/u skin discoloration of the R front ankle - dark/light and liight discoloration up to anterior proximal  1/3 of the shin - "river like"; NT x 2 mo. R foot pain has resolved. There as a mild swelling The patient presents for a follow-up of  chronic hypertension, chronic dyslipidemia, R foot injury C/o wt issue  Wt Readings from Last 3 Encounters:  04/05/13 213 lb (96.616 kg)  03/15/13 212 lb (96.163 kg)  12/08/12 207 lb (93.895 kg)    BP Readings from Last 3 Encounters:  04/05/13 130/90  03/15/13 140/90  12/08/12 110/60     Review of Systems  Constitutional: Positive for unexpected weight change. Negative for fever, chills, diaphoresis, activity change, appetite change and fatigue.  HENT: Negative for congestion, dental problem, ear pain, hearing loss, mouth sores, postnasal drip, sinus pressure, sneezing, sore throat and voice change.   Eyes: Negative for pain and visual disturbance.  Respiratory: Negative for cough, chest tightness, wheezing and stridor.   Cardiovascular: Negative for chest pain, palpitations and leg swelling.  Gastrointestinal: Negative for nausea, vomiting, abdominal pain, blood in stool, abdominal distention and rectal pain.  Genitourinary: Negative for dysuria, hematuria, decreased urine volume, vaginal bleeding, vaginal discharge, difficulty urinating, vaginal pain and menstrual problem.  Musculoskeletal: Negative for back pain, gait problem, joint swelling and neck pain.  Skin: Negative for color change, rash and wound.  Neurological: Negative for dizziness,  tremors, syncope, speech difficulty and light-headedness.  Hematological: Negative for adenopathy.  Psychiatric/Behavioral: Negative for suicidal ideas, hallucinations, behavioral problems, confusion, sleep disturbance, dysphoric mood and decreased concentration. The patient is not nervous/anxious and is not hyperactive.        Objective:   Physical Exam  Constitutional: She appears well-developed. No distress.  Obese   HENT:  Head: Normocephalic.  Right Ear: External ear normal.  Left Ear: External ear normal.  Nose: Nose normal.  Mouth/Throat: Oropharynx is clear and moist.  Eyes: Conjunctivae are normal. Pupils are equal, round, and reactive to light. Right eye exhibits no discharge. Left eye exhibits no discharge.  Neck: Normal range of motion. Neck supple. No JVD present. No tracheal deviation present. No thyromegaly present.  Cardiovascular: Normal rate, regular rhythm and normal heart sounds.   Pulmonary/Chest: No stridor. No respiratory distress. She has no wheezes.  Abdominal: Soft. Bowel sounds are normal. She exhibits no distension and no mass. There is no tenderness. There is no rebound and no guarding.  Musculoskeletal: She exhibits no edema and no tenderness.  Lymphadenopathy:    She has no cervical adenopathy.  Neurological: She displays normal reflexes. No cranial nerve deficit. She exhibits normal muscle tone. Coordination normal.  Skin: No rash noted. No erythema.  Psychiatric: She has a normal mood and affect. Her behavior is normal. Judgment and thought content normal.  Limping less R shoulder is less tender w/palp and ROM; painful R biceps tendon and AC joint R front ankle - dark/light patches and light discoloration up to anterior proximal  1/3 of the shin - "river like"depigmentation to the middle;  NT  Lab Results  Component Value Date   WBC 3.6* 12/24/2011   HGB 11.5* 12/24/2011   HCT 36.2 12/24/2011   PLT 210.0 12/24/2011   GLUCOSE 90 12/24/2011   CHOL 193  12/24/2011   TRIG 74.0 12/24/2011   HDL 48.00 12/24/2011   LDLCALC 130* 12/24/2011   ALT 16 12/24/2011   AST 18 12/24/2011   NA 139 12/24/2011   K 3.6 12/24/2011   CL 104 12/24/2011   CREATININE 1.1 12/24/2011   BUN 16 12/24/2011   CO2 27 12/24/2011   TSH 0.90 12/24/2011          Assessment & Plan:

## 2013-04-05 NOTE — Progress Notes (Signed)
Pre visit review using our clinic review tool, if applicable. No additional management support is needed unless otherwise documented below in the visit note. 

## 2013-04-05 NOTE — Assessment & Plan Note (Signed)
We discussed age appropriate health related issues, including available/recomended screening tests and vaccinations. We discussed a need for adhering to healthy diet and exercise. Labs/EKG were reviewed/ordered. All questions were answered.  Mammo q 12 mo LMP - Oct 2014 GYN q 12 mo  Vit D, Labs

## 2013-04-06 ENCOUNTER — Other Ambulatory Visit (INDEPENDENT_AMBULATORY_CARE_PROVIDER_SITE_OTHER): Payer: Self-pay

## 2013-04-06 DIAGNOSIS — IMO0001 Reserved for inherently not codable concepts without codable children: Secondary | ICD-10-CM

## 2013-04-06 DIAGNOSIS — Z0000 Encounter for general adult medical examination without abnormal findings: Secondary | ICD-10-CM

## 2013-04-06 DIAGNOSIS — I10 Essential (primary) hypertension: Secondary | ICD-10-CM

## 2013-04-06 DIAGNOSIS — R030 Elevated blood-pressure reading, without diagnosis of hypertension: Secondary | ICD-10-CM

## 2013-04-06 DIAGNOSIS — N951 Menopausal and female climacteric states: Secondary | ICD-10-CM

## 2013-04-06 LAB — BASIC METABOLIC PANEL
BUN: 17 mg/dL (ref 6–23)
CO2: 27 mEq/L (ref 19–32)
Calcium: 9.1 mg/dL (ref 8.4–10.5)
Chloride: 102 mEq/L (ref 96–112)
Creatinine, Ser: 1 mg/dL (ref 0.4–1.2)
GFR: 74.45 mL/min (ref >60.00)
Glucose, Bld: 95 mg/dL (ref 70–99)
POTASSIUM: 3.7 meq/L (ref 3.5–5.1)
SODIUM: 138 meq/L (ref 135–145)

## 2013-04-06 LAB — URINALYSIS
BILIRUBIN URINE: NEGATIVE
HGB URINE DIPSTICK: NEGATIVE
Ketones, ur: NEGATIVE
Leukocytes, UA: NEGATIVE
NITRITE: NEGATIVE
Specific Gravity, Urine: 1.01 (ref 1.000–1.030)
Total Protein, Urine: NEGATIVE
UROBILINOGEN UA: 0.2 (ref 0.0–1.0)
Urine Glucose: NEGATIVE
pH: 7 (ref 5.0–8.0)

## 2013-04-06 LAB — CBC WITH DIFFERENTIAL/PLATELET
BASOS ABS: 0 10*3/uL (ref 0.0–0.1)
BASOS PCT: 0.4 % (ref 0.0–3.0)
EOS ABS: 0.1 10*3/uL (ref 0.0–0.7)
Eosinophils Relative: 2.1 % (ref 0.0–5.0)
HEMATOCRIT: 35.8 % — AB (ref 36.0–46.0)
HEMOGLOBIN: 11.9 g/dL — AB (ref 12.0–15.0)
LYMPHS PCT: 43.8 % (ref 12.0–46.0)
Lymphs Abs: 2.1 10*3/uL (ref 0.7–4.0)
MCHC: 33.2 g/dL (ref 30.0–36.0)
MCV: 86.3 fl (ref 78.0–100.0)
MONO ABS: 0.4 10*3/uL (ref 0.1–1.0)
Monocytes Relative: 8.9 % (ref 3.0–12.0)
Neutro Abs: 2.2 10*3/uL (ref 1.4–7.7)
Neutrophils Relative %: 44.8 % (ref 43.0–77.0)
Platelets: 248 10*3/uL (ref 150.0–400.0)
RBC: 4.15 Mil/uL (ref 3.87–5.11)
RDW: 13.8 % (ref 11.5–14.6)
WBC: 4.8 10*3/uL (ref 4.5–10.5)

## 2013-04-06 LAB — HEPATIC FUNCTION PANEL
ALBUMIN: 3.9 g/dL (ref 3.5–5.2)
ALT: 23 U/L (ref 0–35)
AST: 24 U/L (ref 0–37)
Alkaline Phosphatase: 63 U/L (ref 39–117)
Bilirubin, Direct: 0.2 mg/dL (ref 0.0–0.3)
Total Bilirubin: 0.6 mg/dL (ref 0.3–1.2)
Total Protein: 7.5 g/dL (ref 6.0–8.3)

## 2013-04-06 LAB — LDL CHOLESTEROL, DIRECT: Direct LDL: 142.2 mg/dL

## 2013-04-06 LAB — LIPID PANEL
CHOL/HDL RATIO: 4
CHOLESTEROL: 211 mg/dL — AB (ref 0–200)
HDL: 48.5 mg/dL (ref >39.00)
TRIGLYCERIDES: 70 mg/dL (ref 0.0–149.0)
VLDL: 14 mg/dL (ref 0.0–40.0)

## 2013-04-06 LAB — FOLLICLE STIMULATING HORMONE: FSH: 23.5 m[IU]/mL

## 2013-04-06 LAB — TSH: TSH: 0.36 u[IU]/mL (ref 0.35–5.50)

## 2013-04-08 ENCOUNTER — Encounter: Payer: Self-pay | Admitting: Internal Medicine

## 2013-08-23 ENCOUNTER — Other Ambulatory Visit: Payer: Self-pay | Admitting: Internal Medicine

## 2013-09-05 ENCOUNTER — Encounter: Payer: Self-pay | Admitting: Internal Medicine

## 2013-09-05 ENCOUNTER — Ambulatory Visit (INDEPENDENT_AMBULATORY_CARE_PROVIDER_SITE_OTHER): Payer: 59 | Admitting: Internal Medicine

## 2013-09-05 VITALS — BP 120/74 | HR 76 | Temp 98.2°F | Resp 16 | Wt 215.0 lb

## 2013-09-05 DIAGNOSIS — M545 Low back pain, unspecified: Secondary | ICD-10-CM | POA: Insufficient documentation

## 2013-09-05 DIAGNOSIS — M25511 Pain in right shoulder: Secondary | ICD-10-CM

## 2013-09-05 DIAGNOSIS — M25519 Pain in unspecified shoulder: Secondary | ICD-10-CM

## 2013-09-05 MED ORDER — MELOXICAM 15 MG PO TABS: 15.0000 mg | ORAL_TABLET | Freq: Every day | ORAL | Status: DC

## 2013-09-05 MED ORDER — PHENTERMINE HCL 37.5 MG PO TABS: 37.5000 mg | ORAL_TABLET | Freq: Every day | ORAL | Status: DC

## 2013-09-05 MED ORDER — RIZATRIPTAN BENZOATE 10 MG PO TABS: 10.0000 mg | ORAL_TABLET | Freq: Once | ORAL | Status: DC | PRN

## 2013-09-05 NOTE — Assessment & Plan Note (Signed)
Meloxicam prn 

## 2013-09-05 NOTE — Patient Instructions (Signed)
Stretch your back please

## 2013-09-05 NOTE — Progress Notes (Signed)
Subjective:    HPI     C/o LBP worse L>>R, R shoulder pain - better C/o wt gain F/u L ankle fx - remote in 5/14. Screws and plate were removed on Feb 15 2013 Just had a meniscal repair on  L knee 3 wks ago. f/u skin discoloration of the R front ankle - dark/light and liight discoloration up to anterior proximal  1/3 of the shin - "river like"; NT x 2 mo. R foot pain has resolved. There as a mild swelling The patient presents for a follow-up of  chronic hypertension, chronic dyslipidemia, R foot injury   Wt Readings from Last 3 Encounters:  09/05/13 215 lb (97.523 kg)  04/05/13 213 lb (96.616 kg)  03/15/13 212 lb (96.163 kg)    BP Readings from Last 3 Encounters:  09/05/13 120/74  04/05/13 130/90  03/15/13 140/90     Review of Systems  Constitutional: Positive for unexpected weight change. Negative for fever, chills, diaphoresis, activity change, appetite change and fatigue.  HENT: Negative for congestion, dental problem, ear pain, hearing loss, mouth sores, postnasal drip, sinus pressure, sneezing, sore throat and voice change.   Eyes: Negative for pain and visual disturbance.  Respiratory: Negative for cough, chest tightness, wheezing and stridor.   Cardiovascular: Negative for chest pain, palpitations and leg swelling.  Gastrointestinal: Negative for nausea, vomiting, abdominal pain, blood in stool, abdominal distention and rectal pain.  Genitourinary: Negative for dysuria, hematuria, decreased urine volume, vaginal bleeding, vaginal discharge, difficulty urinating, vaginal pain and menstrual problem.  Musculoskeletal: Negative for back pain, gait problem, joint swelling and neck pain.  Skin: Negative for color change, rash and wound.  Neurological: Negative for dizziness, tremors, syncope, speech difficulty and light-headedness.  Hematological: Negative for adenopathy.  Psychiatric/Behavioral: Negative for suicidal ideas, hallucinations, behavioral problems, confusion,  sleep disturbance, dysphoric mood and decreased concentration. The patient is not nervous/anxious and is not hyperactive.        Objective:   Physical Exam  Constitutional: She appears well-developed. No distress.  Obese   HENT:  Head: Normocephalic.  Right Ear: External ear normal.  Left Ear: External ear normal.  Nose: Nose normal.  Mouth/Throat: Oropharynx is clear and moist.  Eyes: Conjunctivae are normal. Pupils are equal, round, and reactive to light. Right eye exhibits no discharge. Left eye exhibits no discharge.  Neck: Normal range of motion. Neck supple. No JVD present. No tracheal deviation present. No thyromegaly present.  Cardiovascular: Normal rate, regular rhythm and normal heart sounds.   Pulmonary/Chest: No stridor. No respiratory distress. She has no wheezes.  Abdominal: Soft. Bowel sounds are normal. She exhibits no distension and no mass. There is no tenderness. There is no rebound and no guarding.  Musculoskeletal: She exhibits no edema and no tenderness.  Lymphadenopathy:    She has no cervical adenopathy.  Neurological: She displays normal reflexes. No cranial nerve deficit. She exhibits normal muscle tone. Coordination normal.  Skin: No rash noted. No erythema.  Psychiatric: She has a normal mood and affect. Her behavior is normal. Judgment and thought content normal.  Limping less LS is tender on R R shoulder is less tender w/palp and ROM; painful R biceps tendon and AC joint R front ankle - dark/light patches and light discoloration up to anterior proximal  1/3 of the shin - "river like"depigmentation to the middle - resolved; NT  Lab Results  Component Value Date   WBC 4.8 04/06/2013   HGB 11.9* 04/06/2013   HCT 35.8* 04/06/2013  PLT 248.0 04/06/2013   GLUCOSE 95 04/06/2013   CHOL 211* 04/06/2013   TRIG 70.0 04/06/2013   HDL 48.50 04/06/2013   LDLDIRECT 142.2 04/06/2013   LDLCALC 130* 12/24/2011   ALT 23 04/06/2013   AST 24 04/06/2013   NA 138 04/06/2013   K 3.7  04/06/2013   CL 102 04/06/2013   CREATININE 1.0 04/06/2013   BUN 17 04/06/2013   CO2 27 04/06/2013   TSH 0.36 04/06/2013          Assessment & Plan:

## 2013-09-05 NOTE — Assessment & Plan Note (Signed)
Better  

## 2013-09-05 NOTE — Progress Notes (Signed)
Pre visit review using our clinic review tool, if applicable. No additional management support is needed unless otherwise documented below in the visit note. 

## 2013-09-07 ENCOUNTER — Telehealth: Payer: Self-pay

## 2013-09-07 MED ORDER — TRAMADOL HCL 50 MG PO TABS: 50.0000 mg | ORAL_TABLET | Freq: Two times a day (BID) | ORAL | Status: DC | PRN

## 2013-09-07 NOTE — Telephone Encounter (Signed)
Ok Tramadol - see Rx Thx

## 2013-09-07 NOTE — Telephone Encounter (Signed)
Patient called [facility] checking status of her mychart message that was sent 09/05/13. "Just left your office forgot to request Tramadol for lowerback pain to help m rest better at night. " Thank you

## 2013-09-08 NOTE — Telephone Encounter (Signed)
Rx called in, pt notified

## 2013-09-15 ENCOUNTER — Encounter: Payer: Self-pay | Admitting: Internal Medicine

## 2013-09-16 ENCOUNTER — Encounter: Payer: Self-pay | Admitting: Nurse Practitioner

## 2013-09-16 ENCOUNTER — Ambulatory Visit (INDEPENDENT_AMBULATORY_CARE_PROVIDER_SITE_OTHER): Payer: 59 | Admitting: Nurse Practitioner

## 2013-09-16 VITALS — BP 124/82 | HR 88 | Temp 97.9°F | Ht 63.5 in | Wt 217.0 lb

## 2013-09-16 DIAGNOSIS — S335XXA Sprain of ligaments of lumbar spine, initial encounter: Secondary | ICD-10-CM

## 2013-09-16 DIAGNOSIS — Z5189 Encounter for other specified aftercare: Secondary | ICD-10-CM

## 2013-09-16 DIAGNOSIS — M545 Low back pain, unspecified: Secondary | ICD-10-CM

## 2013-09-16 DIAGNOSIS — M722 Plantar fascial fibromatosis: Secondary | ICD-10-CM | POA: Insufficient documentation

## 2013-09-16 DIAGNOSIS — S39012D Strain of muscle, fascia and tendon of lower back, subsequent encounter: Secondary | ICD-10-CM

## 2013-09-16 MED ORDER — METHOCARBAMOL 500 MG PO TABS: ORAL_TABLET | ORAL | Status: DC

## 2013-09-16 NOTE — Patient Instructions (Signed)
Perform back stretches as discussed twice daily-should take 10 minutes each time. Use muscle relaxer at night if needed. If pain is persistent, you may need further work up with imaging. Avoid twisting of trunk-turn shoulders & hips together.  Wear shoes with arch all the time. Don't walk barefoot for prolonged periods. Stretch feet as shown after sitting or sleeping.   Back Exercises Back exercises help treat and prevent back injuries. The goal of back exercises is to increase the strength of your abdominal and back muscles and the flexibility of your back. These exercises should be started when you no longer have back pain. Back exercises include:  Pelvic Tilt. Lie on your back with your knees bent. Tilt your pelvis until the lower part of your back is against the floor. Hold this position 5 to 10 sec and repeat 5 to 10 times.  Knee to Chest. Pull first 1 knee up against your chest and hold for 20 to 30 seconds, repeat this with the other knee, and then both knees. This may be done with the other leg straight or bent, whichever feels better.  Sit-Ups or Curl-Ups. Bend your knees 90 degrees. Start with tilting your pelvis, and do a partial, slow sit-up, lifting your trunk only 30 to 45 degrees off the floor. Take at least 2 to 3 seconds for each sit-up. Do not do sit-ups with your knees out straight. If partial sit-ups are difficult, simply do the above but with only tightening your abdominal muscles and holding it as directed.  Hip-Lift. Lie on your back with your knees flexed 90 degrees. Push down with your feet and shoulders as you raise your hips a couple inches off the floor; hold for 10 seconds, repeat 5 to 10 times.  Shoulder-Lifts. Lie face down with arms beside your body. Keep hips and torso pressed to floor as you slowly lift your head and shoulders off the floor. Do not overdo your exercises, especially in the beginning. Exercises may cause you some mild back discomfort which lasts for a  few minutes; however, if the pain is more severe, or lasts for more than 15 minutes, do not continue exercises until you see your caregiver. Improvement with exercise therapy for back problems is slow.  See your caregivers for assistance with developing a proper back exercise program. Document Released: 04/24/2004 Document Revised: 06/09/2011 Document Reviewed: 01/16/2011 [facility] Patient Information 2015 [place], [place]. This information is not intended to replace advice given to you by your health care provider. Make sure you discuss any questions you have with your health care provider.  Plantar Fasciitis Plantar fasciitis is a common condition that causes foot pain. It is soreness (inflammation) of the band of tough fibrous tissue on the bottom of the foot that runs from the heel bone (calcaneus) to the ball of the foot. The cause of this soreness may be from excessive standing, poor fitting shoes, running on hard surfaces, being overweight, having an abnormal walk, or overuse (this is common in runners) of the painful foot or feet. It is also common in aerobic exercise dancers and ballet dancers. SYMPTOMS  Most people with plantar fasciitis complain of:  Severe pain in the morning on the bottom of their foot especially when taking the first steps out of bed. This pain recedes after a few minutes of walking.  Severe pain is experienced also during walking following a long period of inactivity.  Pain is worse when walking barefoot or up stairs DIAGNOSIS   Your caregiver  will diagnose this condition by examining and feeling your foot.  Special tests such as X-rays of your foot, are usually not needed. PREVENTION   Consult a sports medicine professional before beginning a new exercise program.  Walking programs offer a good workout. With walking there is a lower chance of overuse injuries common to runners. There is less impact and less jarring of the joints.  Begin all new exercise  programs slowly. If problems or pain develop, decrease the amount of time or distance until you are at a comfortable level.  Wear good shoes and replace them regularly.  Stretch your foot and the heel cords at the back of the ankle (Achilles tendon) both before and after exercise.  Run or exercise on even surfaces that are not hard. For example, asphalt is better than pavement.  Do not run barefoot on hard surfaces.  If using a treadmill, vary the incline.  Do not continue to workout if you have foot or joint problems. Seek professional help if they do not improve. HOME CARE INSTRUCTIONS   Avoid activities that cause you pain until you recover.  Use ice or cold packs on the problem or painful areas after working out.  Only take over-the-counter or prescription medicines for pain, discomfort, or fever as directed by your caregiver.  Soft shoe inserts or athletic shoes with air or gel sole cushions may be helpful.  If problems continue or become more severe, consult a sports medicine caregiver or your own health care provider. Cortisone is a potent anti-inflammatory medication that may be injected into the painful area. You can discuss this treatment with your caregiver. MAKE SURE YOU:   Understand these instructions.  Will watch your condition.  Will get help right away if you are not doing well or get worse. Document Released: 12/10/2000 Document Revised: 06/09/2011 Document Reviewed: 02/09/2008 [facility] Patient Information 2015 [place], [place]. This information is not intended to replace advice given to you by your health care provider. Make sure you discuss any questions you have with your health care provider.

## 2013-09-16 NOTE — Assessment & Plan Note (Signed)
Morning pain with weight bearing in arch. Discussed & demonstrated stretches. Do not wear shoes without arch support or walk bare foot. Mobic.

## 2013-09-16 NOTE — Progress Notes (Signed)
Subjective:    Amber Reilly is a 48 y.o. female who presents for evaluation of low back pain. She was seen in ofc 10 days ago for same problem. She was prescribed mobic & tramadol. Pt states mobic does not help. Tramadol causing HA. (requested oxycodone on phone, per msg note). Pain is lumbar & r-sided. No radiation, tingling, or weakness. Associated symptoms: morning stiffness, improves after movement. She is exercising several times weekly, using work out video. Also c/o L foot pain-Pain worse in morning or after prolonged sitting. Pain located in arch. Wearing flat shoes today w/no arch support.   The following portions of the patient's history were reviewed and updated as appropriate: allergies, current medications, past medical history, past surgical history and problem list.  Review of Systems Pertinent items are noted in HPI.    Objective:   Full range of motion without pain, no tenderness, no spasm, no curvature. Normal reflexes, gait, strength and negative straight-leg raise. SIJ tenderness, bilat, R worse than L    Assessment:  1. Lumbar strain, subsequent encounter DD: inflammatory arthritis (spondyloarthropathy, psoriatic arthritis), DDD - methocarbamol (ROBAXIN) 500 MG tablet; Take 1 to 2 T PO QHS PRN muscle spasm  Dispense: 20 tablet; Refill: 1 Daily exercises 2. Plantar fasciitis of left foot  See problem list for complete A&P See pt instructions. F/u appt. Already scheduled w/Dr Plotnikov

## 2013-09-16 NOTE — Assessment & Plan Note (Signed)
Likely lumbar strain, but she c/o am stiffness that improves w/movement (imflammatory arthritis-spondyloarthroapthy). Back stretches discussed & demonstrated, bid  Muscle relaxer. If pain not relieved, consider PT, imaging, referral to Ortho.

## 2013-09-16 NOTE — Progress Notes (Signed)
Pre visit review using our clinic review tool, if applicable. No additional management support is needed unless otherwise documented below in the visit note. 

## 2013-09-23 ENCOUNTER — Other Ambulatory Visit (HOSPITAL_COMMUNITY)
Admission: RE | Admit: 2013-09-23 | Discharge: 2013-09-23 | Disposition: A | Discharge status: Home / Self Care | Payer: 59 | Source: Ambulatory Visit | Attending: Internal Medicine | Admitting: Internal Medicine

## 2013-09-23 ENCOUNTER — Encounter: Payer: Self-pay | Admitting: Internal Medicine

## 2013-09-23 ENCOUNTER — Ambulatory Visit (INDEPENDENT_AMBULATORY_CARE_PROVIDER_SITE_OTHER): Payer: 59 | Admitting: Internal Medicine

## 2013-09-23 VITALS — BP 130/70 | HR 72 | Temp 98.5°F | Resp 16 | Wt 215.0 lb

## 2013-09-23 DIAGNOSIS — Z0000 Encounter for general adult medical examination without abnormal findings: Secondary | ICD-10-CM

## 2013-09-23 DIAGNOSIS — Z01419 Encounter for gynecological examination (general) (routine) without abnormal findings: Secondary | ICD-10-CM | POA: Insufficient documentation

## 2013-09-23 DIAGNOSIS — M545 Low back pain, unspecified: Secondary | ICD-10-CM

## 2013-09-23 MED ORDER — DICLOFENAC SODIUM 75 MG PO TBEC: 75.0000 mg | DELAYED_RELEASE_TABLET | Freq: Two times a day (BID) | ORAL | Status: DC | PRN

## 2013-09-23 MED ORDER — OXYCODONE-ACETAMINOPHEN 5-325 MG PO TABS: 0.5000 | ORAL_TABLET | Freq: Two times a day (BID) | ORAL | Status: DC | PRN

## 2013-09-23 NOTE — Progress Notes (Signed)
Pre visit review using our clinic review tool, if applicable. No additional management support is needed unless otherwise documented below in the visit note. 

## 2013-09-23 NOTE — Assessment & Plan Note (Signed)
PAP

## 2013-09-23 NOTE — Assessment & Plan Note (Addendum)
Xray Wt loss Sretches Percocet w/caution - see Rx Diclofenac prn

## 2013-09-23 NOTE — Patient Instructions (Signed)
-- 

## 2013-09-23 NOTE — Progress Notes (Signed)
Subjective:    Back Pain This is a chronic problem. The current episode started more than 1 year ago. The problem has been gradually worsening since onset. The pain is present in the lumbar spine. The quality of the pain is described as stabbing and aching. The pain does not radiate. The pain is at a severity of 8/10. The pain is severe. The pain is worse during the night. The symptoms are aggravated by lying down. Stiffness is present in the morning. Pertinent negatives include no abdominal pain, chest pain, dysuria or fever. She has tried analgesics, home exercises and muscle relaxant for the symptoms. The treatment provided mild relief.    The patient is here for a PAP  C/o LBP  F/u L ankle fx - remote in 5/14. Screws and plate were removed on Feb 15 2013 Just had a meniscal repair on  L knee 3 wks ago. f/u skin discoloration of the R front ankle - dark/light and liight discoloration up to anterior proximal  1/3 of the shin - "river like"; NT x 2 mo. R foot pain has resolved. There as a mild swelling The patient presents for a follow-up of  chronic hypertension, chronic dyslipidemia, R foot injury C/o wt issue  Wt Readings from Last 3 Encounters:  09/23/13 215 lb (97.523 kg)  09/16/13 217 lb (98.431 kg)  09/05/13 215 lb (97.523 kg)    BP Readings from Last 3 Encounters:  09/23/13 130/70  09/16/13 124/82  09/05/13 120/74     Review of Systems  Constitutional: Positive for unexpected weight change. Negative for fever, chills, diaphoresis, activity change, appetite change and fatigue.  HENT: Negative for congestion, dental problem, ear pain, hearing loss, mouth sores, postnasal drip, sinus pressure, sneezing, sore throat and voice change.   Eyes: Negative for pain and visual disturbance.  Respiratory: Negative for cough, chest tightness, wheezing and stridor.   Cardiovascular: Negative for chest pain, palpitations and leg swelling.  Gastrointestinal: Negative for nausea,  vomiting, abdominal pain, blood in stool, abdominal distention and rectal pain.  Genitourinary: Negative for dysuria, hematuria, decreased urine volume, vaginal bleeding, vaginal discharge, difficulty urinating, vaginal pain and menstrual problem.  Musculoskeletal: Negative for back pain, gait problem, joint swelling and neck pain.  Skin: Negative for color change, rash and wound.  Neurological: Negative for dizziness, tremors, syncope, speech difficulty and light-headedness.  Hematological: Negative for adenopathy.  Psychiatric/Behavioral: Negative for suicidal ideas, hallucinations, behavioral problems, confusion, sleep disturbance, dysphoric mood and decreased concentration. The patient is not nervous/anxious and is not hyperactive.        Objective:   Physical Exam  Constitutional: She appears well-developed. No distress.  Obese   HENT:  Head: Normocephalic.  Right Ear: External ear normal.  Left Ear: External ear normal.  Nose: Nose normal.  Mouth/Throat: Oropharynx is clear and moist.  Eyes: Conjunctivae are normal. Pupils are equal, round, and reactive to light. Right eye exhibits no discharge. Left eye exhibits no discharge.  Neck: Normal range of motion. Neck supple. No JVD present. No tracheal deviation present. No thyromegaly present.  Cardiovascular: Normal rate, regular rhythm and normal heart sounds.   Pulmonary/Chest: No stridor. No respiratory distress. She has no wheezes.  Abdominal: Soft. Bowel sounds are normal. She exhibits no distension and no mass. There is no tenderness. There is no rebound and no guarding.  Musculoskeletal: She exhibits no edema and no tenderness.  Lymphadenopathy:    She has no cervical adenopathy.  Neurological: She displays normal reflexes. No cranial  nerve deficit. She exhibits normal muscle tone. Coordination normal.  Skin: No rash noted. No erythema.  Psychiatric: She has a normal mood and affect. Her behavior is normal. Judgment and  thought content normal.  Limping less R shoulder is less tender w/palp and ROM; painful R biceps tendon and AC joint R front ankle - dark/light patches and light discoloration up to anterior proximal  1/3 of the shin - "river like"depigmentation to the middle; NT  Lab Results  Component Value Date   WBC 4.8 04/06/2013   HGB 11.9* 04/06/2013   HCT 35.8* 04/06/2013   PLT 248.0 04/06/2013   GLUCOSE 95 04/06/2013   CHOL 211* 04/06/2013   TRIG 70.0 04/06/2013   HDL 48.50 04/06/2013   LDLDIRECT 142.2 04/06/2013   LDLCALC 130* 12/24/2011   ALT 23 04/06/2013   AST 24 04/06/2013   NA 138 04/06/2013   K 3.7 04/06/2013   CL 102 04/06/2013   CREATININE 1.0 04/06/2013   BUN 17 04/06/2013   CO2 27 04/06/2013   TSH 0.36 04/06/2013          Assessment & Plan:

## 2013-09-27 LAB — CYTOLOGY - PAP

## 2014-03-07 ENCOUNTER — Other Ambulatory Visit: Payer: Self-pay | Admitting: Internal Medicine

## 2014-03-09 NOTE — Telephone Encounter (Signed)
Patient called to check status of refill request sent by pharmacy [facility], currently out of medication/last Rx expired with no remaining fills.

## 2014-03-10 MED ORDER — PHENTERMINE HCL 37.5 MG PO TABS: 37.5000 mg | ORAL_TABLET | Freq: Every day | ORAL | Status: DC

## 2014-03-10 NOTE — Addendum Note (Signed)
Addended by: Earnstine Regal on: 03/10/2014 09:04 AM   Modules accepted: Orders

## 2014-03-10 NOTE — Telephone Encounter (Signed)
Notified pt rx ready for pick-up.../lmb 

## 2014-11-23 ENCOUNTER — Encounter: Payer: Self-pay | Admitting: Internal Medicine

## 2014-11-23 ENCOUNTER — Other Ambulatory Visit: Payer: Self-pay | Admitting: *Deleted

## 2014-11-23 MED ORDER — LOSARTAN POTASSIUM-HCTZ 100-12.5 MG PO TABS: 1.0000 | ORAL_TABLET | Freq: Every day | ORAL | Status: DC

## 2014-11-23 NOTE — Telephone Encounter (Signed)
Pt also call triage left msg needing refill on her Losartan. [facility] 30 pt is due for appt which has been set-up for 12/07/14. Sent pt email back with info...Johny Chess

## 2014-12-08 ENCOUNTER — Encounter: Payer: Self-pay | Admitting: Internal Medicine

## 2014-12-12 ENCOUNTER — Encounter: Payer: Self-pay | Admitting: Internal Medicine

## 2014-12-26 ENCOUNTER — Encounter: Payer: Self-pay | Admitting: Internal Medicine

## 2015-01-03 ENCOUNTER — Encounter: Payer: Self-pay | Admitting: Internal Medicine

## 2015-01-03 ENCOUNTER — Ambulatory Visit (INDEPENDENT_AMBULATORY_CARE_PROVIDER_SITE_OTHER): Payer: 59 | Admitting: Internal Medicine

## 2015-01-03 ENCOUNTER — Other Ambulatory Visit (HOSPITAL_COMMUNITY)
Admission: RE | Admit: 2015-01-03 | Discharge: 2015-01-03 | Disposition: A | Discharge status: Home / Self Care | Payer: 59 | Source: Ambulatory Visit | Attending: Internal Medicine | Admitting: Internal Medicine

## 2015-01-03 VITALS — BP 140/80 | HR 88 | Ht 64.0 in | Wt 210.0 lb

## 2015-01-03 DIAGNOSIS — R61 Generalized hyperhidrosis: Secondary | ICD-10-CM | POA: Insufficient documentation

## 2015-01-03 DIAGNOSIS — I10 Essential (primary) hypertension: Secondary | ICD-10-CM | POA: Diagnosis not present

## 2015-01-03 DIAGNOSIS — Z23 Encounter for immunization: Secondary | ICD-10-CM

## 2015-01-03 DIAGNOSIS — Z124 Encounter for screening for malignant neoplasm of cervix: Secondary | ICD-10-CM | POA: Diagnosis not present

## 2015-01-03 DIAGNOSIS — Z0000 Encounter for general adult medical examination without abnormal findings: Secondary | ICD-10-CM

## 2015-01-03 DIAGNOSIS — Z01419 Encounter for gynecological examination (general) (routine) without abnormal findings: Secondary | ICD-10-CM | POA: Insufficient documentation

## 2015-01-03 MED ORDER — VITAMIN D3 50 MCG (2000 UT) PO CAPS: 2000.0000 [IU] | ORAL_CAPSULE | Freq: Every day | ORAL | Status: DC

## 2015-01-03 MED ORDER — RIZATRIPTAN BENZOATE 10 MG PO TABS: 10.0000 mg | ORAL_TABLET | Freq: Once | ORAL | Status: DC | PRN

## 2015-01-03 MED ORDER — LOSARTAN POTASSIUM-HCTZ 100-12.5 MG PO TABS: 1.0000 | ORAL_TABLET | Freq: Every day | ORAL | Status: DC

## 2015-01-03 NOTE — Assessment & Plan Note (Signed)
FSH

## 2015-01-03 NOTE — Progress Notes (Signed)
Subjective:  Patient ID: Amber Reilly, female    DOB: [birth year]  Age: 49 y.o. MRN: [id number]  CC: Annual Exam   HPI [name] presents for well exam  Outpatient Prescriptions Prior to Visit  Medication Sig Dispense Refill  . methocarbamol (ROBAXIN) 500 MG tablet Take 1 to 2 T PO QHS PRN muscle spasm 20 tablet 1  . losartan-hydrochlorothiazide (HYZAAR) 100-12.5 MG per tablet Take 1 tablet by mouth daily. 30 tablet 0  . phentermine (ADIPEX-P) 37.5 MG tablet Take 1 tablet (37.5 mg total) by mouth daily. 30 tablet 2  . diclofenac (VOLTAREN) 75 MG EC tablet Take 1 tablet (75 mg total) by mouth 2 (two) times daily as needed for moderate pain. (Patient not taking: Reported on 01/03/2015) 60 tablet 3  . oxyCODONE-acetaminophen (ROXICET) 5-325 MG per tablet Take 0.5-1 tablets by mouth 2 (two) times daily as needed for severe pain. (Patient not taking: Reported on 01/03/2015) 60 tablet 0  . rizatriptan (MAXALT) 10 MG tablet Take 1 tablet (10 mg total) by mouth once as needed for migraine. May repeat in 2 hours if needed 12 tablet 3   No facility-administered medications prior to visit.    ROS Review of Systems  Constitutional: Positive for diaphoresis. Negative for chills, activity change, appetite change, fatigue and unexpected weight change.  HENT: Negative for congestion, mouth sores and sinus pressure.   Eyes: Negative for visual disturbance.  Respiratory: Negative for cough and chest tightness.   Gastrointestinal: Negative for nausea and abdominal pain.  Genitourinary: Positive for menstrual problem. Negative for frequency, difficulty urinating and vaginal pain.  Musculoskeletal: Negative for back pain and gait problem.  Skin: Negative for pallor and rash.  Neurological: Negative for dizziness, tremors, weakness, numbness and headaches.  Psychiatric/Behavioral: Negative for suicidal ideas, confusion and sleep disturbance.    Objective:  BP 140/80 mmHg  Pulse 88  Ht 5\' 4"   (1.626 m)  Wt 210 lb (95.255 kg)  BMI 36.03 kg/m2  SpO2 99%  BP Readings from Last 3 Encounters:  01/03/15 140/80  09/23/13 130/70  09/16/13 124/82    Wt Readings from Last 3 Encounters:  01/03/15 210 lb (95.255 kg)  09/23/13 215 lb (97.523 kg)  09/16/13 217 lb (98.431 kg)    Physical Exam  Constitutional: She appears well-developed. No distress.  HENT:  Head: Normocephalic.  Right Ear: External ear normal.  Left Ear: External ear normal.  Nose: Nose normal.  Mouth/Throat: Oropharynx is clear and moist.  Eyes: Conjunctivae are normal. Pupils are equal, round, and reactive to light. Right eye exhibits no discharge. Left eye exhibits no discharge.  Neck: Normal range of motion. Neck supple. No JVD present. No tracheal deviation present. No thyromegaly present.  Cardiovascular: Normal rate, regular rhythm and normal heart sounds.   Pulmonary/Chest: No stridor. No respiratory distress. She has no wheezes.  Abdominal: Soft. Bowel sounds are normal. She exhibits no distension and no mass. There is no tenderness. There is no rebound and no guarding.  Genitourinary: Uterus normal. Vaginal discharge found.  Musculoskeletal: She exhibits no edema or tenderness.  Lymphadenopathy:    She has no cervical adenopathy.  Neurological: She displays normal reflexes. No cranial nerve deficit. She exhibits normal muscle tone. Coordination normal.  Skin: No rash noted. No erythema.  Psychiatric: She has a normal mood and affect. Her behavior is normal. Judgment and thought content normal.  Pt declined rectal exam Breasts - B WNL  Lab Results  Component Value Date   WBC  4.8 04/06/2013   HGB 11.9* 04/06/2013   HCT 35.8* 04/06/2013   PLT 248.0 04/06/2013   GLUCOSE 95 04/06/2013   CHOL 211* 04/06/2013   TRIG 70.0 04/06/2013   HDL 48.50 04/06/2013   LDLDIRECT 142.2 04/06/2013   LDLCALC 130* 12/24/2011   ALT 23 04/06/2013   AST 24 04/06/2013   NA 138 04/06/2013   K 3.7 04/06/2013   CL 102  04/06/2013   CREATININE 1.0 04/06/2013   BUN 17 04/06/2013   CO2 27 04/06/2013   TSH 0.36 04/06/2013    No results found.  Assessment & Plan:   [name] was seen today for annual exam.  Diagnoses and all orders for this visit:  Well adult exam -     Basic metabolic panel; Future -     CBC with Differential/Platelet; Future -     Hepatic function panel; Future -     Lipid panel; Future -     TSH; Future -     Urinalysis; Future -     FSH; Future -     MM Digital Screening; Future  Essential hypertension  Diaphoresis  Need for influenza vaccination -     Flu Vaccine QUAD 36+ mos IM  Pap smear for cervical cancer screening -     Cytology - PAP  Other orders -     rizatriptan (MAXALT) 10 MG tablet; Take 1 tablet (10 mg total) by mouth once as needed for migraine. May repeat in 2 hours if needed -     losartan-hydrochlorothiazide (HYZAAR) 100-12.5 MG tablet; Take 1 tablet by mouth daily. -     Cholecalciferol (VITAMIN D3) 2000 UNITS capsule; Take 1 capsule (2,000 Units total) by mouth daily.  I have discontinued Ms. Martin's oxyCODONE-acetaminophen, diclofenac, and phentermine. I have also changed her losartan-hydrochlorothiazide. Additionally, I am having her start on Vitamin D3. Lastly, I am having her maintain her methocarbamol and rizatriptan.  Meds ordered this encounter  Medications  . rizatriptan (MAXALT) 10 MG tablet    Sig: Take 1 tablet (10 mg total) by mouth once as needed for migraine. May repeat in 2 hours if needed    Dispense:  12 tablet    Refill:  3  . losartan-hydrochlorothiazide (HYZAAR) 100-12.5 MG tablet    Sig: Take 1 tablet by mouth daily.    Dispense:  90 tablet    Refill:  3  . Cholecalciferol (VITAMIN D3) 2000 UNITS capsule    Sig: Take 1 capsule (2,000 Units total) by mouth daily.    Dispense:  100 capsule    Refill:  3     Follow-up: Return in about 1 year (around 01/03/2016) for Wellness Exam.  Walker Kehr, MD

## 2015-01-03 NOTE — Assessment & Plan Note (Signed)
Pt took Hyzaar too late

## 2015-01-03 NOTE — Progress Notes (Signed)
Pre visit review using our clinic review tool, if applicable. No additional management support is needed unless otherwise documented below in the visit note. 

## 2015-01-03 NOTE — Assessment & Plan Note (Signed)
We discussed age appropriate health related issues, including available/recomended screening tests and vaccinations. We discussed a need for adhering to healthy diet and exercise. Labs/EKG were reviewed/ordered. All questions were answered. Mammo q 12 mo LMP - Oct 2014 GYN q 12 mo 

## 2015-01-09 LAB — CYTOLOGY - PAP

## 2015-01-11 ENCOUNTER — Encounter: Payer: Self-pay | Admitting: Internal Medicine

## 2015-01-15 ENCOUNTER — Encounter: Payer: Self-pay | Admitting: Internal Medicine

## 2015-01-15 NOTE — Telephone Encounter (Signed)
Would recommend that she come in and do her labs. Would not recommend treatment for bacteria based on pap results. If she was having symptoms then possibly based on exam.

## 2015-01-17 ENCOUNTER — Other Ambulatory Visit (INDEPENDENT_AMBULATORY_CARE_PROVIDER_SITE_OTHER): Payer: 59

## 2015-01-17 ENCOUNTER — Telehealth: Payer: Self-pay | Admitting: *Deleted

## 2015-01-17 DIAGNOSIS — R7989 Other specified abnormal findings of blood chemistry: Secondary | ICD-10-CM | POA: Diagnosis not present

## 2015-01-17 DIAGNOSIS — Z0000 Encounter for general adult medical examination without abnormal findings: Secondary | ICD-10-CM

## 2015-01-17 LAB — URINALYSIS
Bilirubin Urine: NEGATIVE
Hgb urine dipstick: NEGATIVE
Leukocytes, UA: NEGATIVE
Nitrite: NEGATIVE
PH: 6.5 (ref 5.0–8.0)
SPECIFIC GRAVITY, URINE: 1.02 (ref 1.000–1.030)
TOTAL PROTEIN, URINE-UPE24: NEGATIVE
URINE GLUCOSE: NEGATIVE
UROBILINOGEN UA: 0.2 (ref 0.0–1.0)

## 2015-01-17 LAB — LDL CHOLESTEROL, DIRECT: Direct LDL: 119 mg/dL

## 2015-01-17 LAB — CBC WITH DIFFERENTIAL/PLATELET
BASOS PCT: 0.6 % (ref 0.0–3.0)
Basophils Absolute: 0 10*3/uL (ref 0.0–0.1)
EOS ABS: 0.1 10*3/uL (ref 0.0–0.7)
Eosinophils Relative: 2.4 % (ref 0.0–5.0)
HCT: 37.6 % (ref 36.0–46.0)
Hemoglobin: 12.1 g/dL (ref 12.0–15.0)
Lymphocytes Relative: 32.8 % (ref 12.0–46.0)
Lymphs Abs: 1.8 10*3/uL (ref 0.7–4.0)
MCHC: 32.3 g/dL (ref 30.0–36.0)
MCV: 86.3 fl (ref 78.0–100.0)
MONO ABS: 0.5 10*3/uL (ref 0.1–1.0)
Monocytes Relative: 9.3 % (ref 3.0–12.0)
Neutro Abs: 3.1 10*3/uL (ref 1.4–7.7)
Neutrophils Relative %: 54.9 % (ref 43.0–77.0)
Platelets: 252 10*3/uL (ref 150.0–400.0)
RBC: 4.35 Mil/uL (ref 3.87–5.11)
RDW: 14.9 % (ref 11.5–15.5)
WBC: 5.6 10*3/uL (ref 4.0–10.5)

## 2015-01-17 LAB — LIPID PANEL
CHOL/HDL RATIO: 4
Cholesterol: 208 mg/dL — ABNORMAL HIGH (ref 0–200)
HDL: 55.2 mg/dL (ref >39.00)
NONHDL: 153.19
TRIGLYCERIDES: 344 mg/dL — AB (ref 0.0–149.0)
VLDL: 68.8 mg/dL — ABNORMAL HIGH (ref 0.0–40.0)

## 2015-01-17 LAB — HEPATIC FUNCTION PANEL
ALT: 14 U/L (ref 0–35)
AST: 16 U/L (ref 0–37)
Albumin: 3.9 g/dL (ref 3.5–5.2)
Alkaline Phosphatase: 65 U/L (ref 39–117)
BILIRUBIN DIRECT: 0 mg/dL (ref 0.0–0.3)
BILIRUBIN TOTAL: 0.3 mg/dL (ref 0.2–1.2)
Total Protein: 7.4 g/dL (ref 6.0–8.3)

## 2015-01-17 LAB — TSH: TSH: 0.35 u[IU]/mL (ref 0.35–4.50)

## 2015-01-17 LAB — FOLLICLE STIMULATING HORMONE: FSH: 30.8 m[IU]/mL

## 2015-01-17 LAB — BASIC METABOLIC PANEL
BUN: 18 mg/dL (ref 6–23)
CALCIUM: 9.1 mg/dL (ref 8.4–10.5)
CO2: 31 mEq/L (ref 19–32)
CREATININE: 1.19 mg/dL (ref 0.40–1.20)
Chloride: 101 mEq/L (ref 96–112)
GFR: 61.86 mL/min (ref >60.00)
Glucose, Bld: 96 mg/dL (ref 70–99)
POTASSIUM: 3.7 meq/L (ref 3.5–5.1)
SODIUM: 139 meq/L (ref 135–145)

## 2015-01-17 NOTE — Telephone Encounter (Signed)
Pt informed of below. See 01/15/15 MyChart message.      Hoyt Koch, MD at 01/15/2015 3:46 PM     Status: Signed       Expand All Collapse All   Would recommend that she come in and do her labs. Would not recommend treatment for bacteria based on pap results. If she was having symptoms then possibly based on exam.

## 2015-01-23 ENCOUNTER — Encounter: Payer: Self-pay | Admitting: Internal Medicine

## 2015-01-24 ENCOUNTER — Other Ambulatory Visit: Payer: Self-pay | Admitting: Internal Medicine

## 2015-01-24 MED ORDER — METRONIDAZOLE 500 MG PO TABS: 500.0000 mg | ORAL_TABLET | Freq: Two times a day (BID) | ORAL | Status: DC

## 2015-02-01 ENCOUNTER — Other Ambulatory Visit: Payer: Self-pay | Admitting: Internal Medicine

## 2015-02-01 MED ORDER — FLUCONAZOLE 150 MG PO TABS: 150.0000 mg | ORAL_TABLET | Freq: Once | ORAL | Status: DC

## 2015-03-16 ENCOUNTER — Telehealth: Payer: Self-pay | Admitting: Family

## 2015-03-16 DIAGNOSIS — M545 Low back pain, unspecified: Secondary | ICD-10-CM

## 2015-03-16 MED ORDER — BACLOFEN 10 MG PO TABS: 10.0000 mg | ORAL_TABLET | Freq: Three times a day (TID) | ORAL | Status: DC

## 2015-03-16 MED ORDER — ETODOLAC 300 MG PO CAPS: 300.0000 mg | ORAL_CAPSULE | Freq: Two times a day (BID) | ORAL | Status: DC

## 2015-03-16 NOTE — Progress Notes (Signed)

## 2015-04-20 ENCOUNTER — Other Ambulatory Visit: Payer: Self-pay | Admitting: Internal Medicine

## 2015-04-20 DIAGNOSIS — Z1231 Encounter for screening mammogram for malignant neoplasm of breast: Secondary | ICD-10-CM

## 2015-04-30 ENCOUNTER — Encounter: Payer: Self-pay | Admitting: Internal Medicine

## 2015-05-01 ENCOUNTER — Other Ambulatory Visit: Payer: Self-pay | Admitting: Internal Medicine

## 2015-05-01 ENCOUNTER — Ambulatory Visit
Admission: RE | Admit: 2015-05-01 | Discharge: 2015-05-01 | Disposition: A | Discharge status: Home / Self Care | Payer: 59 | Source: Ambulatory Visit | Attending: Internal Medicine | Admitting: Internal Medicine

## 2015-05-01 DIAGNOSIS — Z1231 Encounter for screening mammogram for malignant neoplasm of breast: Secondary | ICD-10-CM

## 2015-05-01 MED ORDER — METRONIDAZOLE 0.75 % VA GEL: 1.0000 | Freq: Two times a day (BID) | VAGINAL | Status: DC

## 2015-05-04 ENCOUNTER — Other Ambulatory Visit: Payer: Self-pay | Admitting: Internal Medicine

## 2015-05-04 DIAGNOSIS — R928 Other abnormal and inconclusive findings on diagnostic imaging of breast: Secondary | ICD-10-CM

## 2015-05-09 ENCOUNTER — Telehealth: Payer: Self-pay | Admitting: Internal Medicine

## 2015-05-09 NOTE — Telephone Encounter (Signed)
[facility] is needing a signed order for the pts appt tomorrow.

## 2015-05-10 ENCOUNTER — Ambulatory Visit
Admission: RE | Admit: 2015-05-10 | Discharge: 2015-05-10 | Disposition: A | Discharge status: Home / Self Care | Payer: 59 | Source: Ambulatory Visit | Attending: Internal Medicine | Admitting: Internal Medicine

## 2015-05-10 DIAGNOSIS — R928 Other abnormal and inconclusive findings on diagnostic imaging of breast: Secondary | ICD-10-CM

## 2015-05-10 NOTE — Telephone Encounter (Signed)
An order for what? Thx

## 2015-05-10 NOTE — Telephone Encounter (Signed)
Pt was schedule to have a [facility], left breast. Faxed order in system...Johny Chess

## 2015-05-31 ENCOUNTER — Encounter: Payer: Self-pay | Admitting: Internal Medicine

## 2015-06-17 ENCOUNTER — Telehealth: Payer: 59 | Admitting: Family

## 2015-06-17 DIAGNOSIS — M545 Low back pain, unspecified: Secondary | ICD-10-CM

## 2015-06-17 MED ORDER — ETODOLAC 300 MG PO CAPS: 300.0000 mg | ORAL_CAPSULE | Freq: Two times a day (BID) | ORAL | Status: DC

## 2015-06-17 MED ORDER — BACLOFEN 10 MG PO TABS: 10.0000 mg | ORAL_TABLET | Freq: Three times a day (TID) | ORAL | Status: DC

## 2015-06-17 NOTE — Progress Notes (Signed)

## 2015-10-18 DIAGNOSIS — M1711 Unilateral primary osteoarthritis, right knee: Secondary | ICD-10-CM | POA: Insufficient documentation

## 2015-10-18 DIAGNOSIS — M23321 Other meniscus derangements, posterior horn of medial meniscus, right knee: Secondary | ICD-10-CM | POA: Insufficient documentation

## 2016-01-15 ENCOUNTER — Other Ambulatory Visit: Payer: Self-pay | Admitting: Internal Medicine

## 2016-06-10 ENCOUNTER — Other Ambulatory Visit: Payer: Self-pay | Admitting: Internal Medicine

## 2016-06-11 ENCOUNTER — Telehealth: Payer: Self-pay | Admitting: Internal Medicine

## 2016-06-12 NOTE — Telephone Encounter (Signed)
Pt rf request on Losartan 100-25mg . Denied, appt is needed. Pt was made aware in MyChart msg.  Last r/f 12/2015. Last OV 12/2014.

## 2016-06-30 ENCOUNTER — Ambulatory Visit (INDEPENDENT_AMBULATORY_CARE_PROVIDER_SITE_OTHER): Payer: Managed Care, Other (non HMO) | Admitting: Internal Medicine

## 2016-06-30 ENCOUNTER — Encounter: Payer: Self-pay | Admitting: Internal Medicine

## 2016-06-30 DIAGNOSIS — I10 Essential (primary) hypertension: Secondary | ICD-10-CM

## 2016-06-30 DIAGNOSIS — M25511 Pain in right shoulder: Secondary | ICD-10-CM | POA: Diagnosis not present

## 2016-06-30 DIAGNOSIS — G8929 Other chronic pain: Secondary | ICD-10-CM | POA: Diagnosis not present

## 2016-06-30 DIAGNOSIS — R635 Abnormal weight gain: Secondary | ICD-10-CM

## 2016-06-30 DIAGNOSIS — E049 Nontoxic goiter, unspecified: Secondary | ICD-10-CM | POA: Diagnosis not present

## 2016-06-30 DIAGNOSIS — G43909 Migraine, unspecified, not intractable, without status migrainosus: Secondary | ICD-10-CM | POA: Diagnosis not present

## 2016-06-30 MED ORDER — LOSARTAN POTASSIUM-HCTZ 100-12.5 MG PO TABS: 1.0000 | ORAL_TABLET | Freq: Every day | ORAL | 3 refills | Status: DC

## 2016-06-30 MED ORDER — RIZATRIPTAN BENZOATE 10 MG PO TABS: 10.0000 mg | ORAL_TABLET | Freq: Once | ORAL | 3 refills | Status: DC | PRN

## 2016-06-30 NOTE — Assessment & Plan Note (Addendum)
[facility] Pt declined inj and Sports med ref ROM exercises

## 2016-06-30 NOTE — Assessment & Plan Note (Signed)
Wt Readings from Last 3 Encounters:  06/30/16 259 lb (117.5 kg)  01/03/15 210 lb (95.3 kg)  09/23/13 215 lb (97.5 kg)    Labs

## 2016-06-30 NOTE — Assessment & Plan Note (Signed)
Maxalt prn 

## 2016-06-30 NOTE — Assessment & Plan Note (Signed)
Repeat US

## 2016-06-30 NOTE — Assessment & Plan Note (Signed)
Worse Re-start meds 

## 2016-06-30 NOTE — Progress Notes (Signed)
Subjective:  Patient ID: Amber Reilly, female    DOB: [birth year]  Age: 51 y.o. MRN: [id number]  CC: Hypertension (medication refilll) and Shoulder Pain (Pt stated B/L shoulder been painful for about 1 month)   HPI Amber Reilly presents for HTN - out of Rx C/o R shoulder pain  Outpatient Medications Prior to Visit  Medication Sig Dispense Refill  . Cholecalciferol (VITAMIN D3) 2000 UNITS capsule Take 1 capsule (2,000 Units total) by mouth daily. 100 capsule 3  . losartan-hydrochlorothiazide (HYZAAR) 100-12.5 MG tablet TAKE 1 TABLET BY MOUTH DAILY. 90 tablet 0  . fluconazole (DIFLUCAN) 150 MG tablet Take 1 tablet (150 mg total) by mouth once. 1 tablet 1  . rizatriptan (MAXALT) 10 MG tablet Take 1 tablet (10 mg total) by mouth once as needed for migraine. May repeat in 2 hours if needed 12 tablet 3  . baclofen (LIORESAL) 10 MG tablet Take 1 tablet (10 mg total) by mouth 3 (three) times daily. 30 each 0  . etodolac (LODINE) 300 MG capsule Take 1 capsule (300 mg total) by mouth 2 (two) times daily. 20 capsule 0  . methocarbamol (ROBAXIN) 500 MG tablet Take 1 to 2 T PO QHS PRN muscle spasm 20 tablet 1  . metroNIDAZOLE (FLAGYL) 500 MG tablet Take 1 tablet (500 mg total) by mouth 2 (two) times daily. 14 tablet 0  . metroNIDAZOLE (METROGEL VAGINAL) 0.75 % vaginal gel Place 1 Applicatorful vaginally 2 (two) times daily. Use PV 70 g 0   No facility-administered medications prior to visit.     ROS Review of Systems  Constitutional: Negative for activity change, appetite change, chills, fatigue and unexpected weight change.  HENT: Negative for congestion, mouth sores and sinus pressure.   Eyes: Negative for visual disturbance.  Respiratory: Negative for cough and chest tightness.   Gastrointestinal: Negative for abdominal pain and nausea.  Genitourinary: Negative for difficulty urinating, frequency and vaginal pain.  Musculoskeletal: Negative for back pain and gait problem.  Skin:  Negative for pallor and rash.  Neurological: Negative for dizziness, tremors, weakness, numbness and headaches.  Psychiatric/Behavioral: Negative for confusion, sleep disturbance and suicidal ideas.    Objective:  BP (!) 172/110   Pulse 80   Temp 97.6 F (36.4 C)   Ht 5\' 4"  (1.626 m)   Wt 259 lb (117.5 kg)   SpO2 98%   BMI 44.46 kg/m   BP Readings from Last 3 Encounters:  06/30/16 (!) 172/110  01/03/15 140/80  09/23/13 130/70    Wt Readings from Last 3 Encounters:  06/30/16 259 lb (117.5 kg)  01/03/15 210 lb (95.3 kg)  09/23/13 215 lb (97.5 kg)    Physical Exam  Constitutional: She appears well-developed. No distress.  HENT:  Head: Normocephalic.  Right Ear: External ear normal.  Left Ear: External ear normal.  Nose: Nose normal.  Mouth/Throat: Oropharynx is clear and moist.  Eyes: Conjunctivae are normal. Pupils are equal, round, and reactive to light. Right eye exhibits no discharge. Left eye exhibits no discharge.  Neck: Normal range of motion. Neck supple. No JVD present. No tracheal deviation present. No thyromegaly present.  Cardiovascular: Normal rate, regular rhythm and normal heart sounds.   Pulmonary/Chest: No stridor. No respiratory distress. She has no wheezes.  Abdominal: Soft. Bowel sounds are normal. She exhibits no distension and no mass. There is no tenderness. There is no rebound and no guarding.  Musculoskeletal: She exhibits tenderness. She exhibits no edema.  Lymphadenopathy:    She  has no cervical adenopathy.  Neurological: She displays normal reflexes. No cranial nerve deficit. She exhibits normal muscle tone. Coordination normal.  Skin: No rash noted. No erythema.  Psychiatric: She has a normal mood and affect. Her behavior is normal. Judgment and thought content normal.  goiter Obese  Lab Results  Component Value Date   WBC 5.6 01/17/2015   HGB 12.1 01/17/2015   HCT 37.6 01/17/2015   PLT 252.0 01/17/2015   GLUCOSE 96 01/17/2015   CHOL  208 (H) 01/17/2015   TRIG 344.0 (H) 01/17/2015   HDL 55.20 01/17/2015   LDLDIRECT 119.0 01/17/2015   LDLCALC 130 (H) 12/24/2011   ALT 14 01/17/2015   AST 16 01/17/2015   NA 139 01/17/2015   K 3.7 01/17/2015   CL 101 01/17/2015   CREATININE 1.19 01/17/2015   BUN 18 01/17/2015   CO2 31 01/17/2015   TSH 0.35 01/17/2015    Mm Diag Breast Tomo Uni Left  Result Date: 05/10/2015 CLINICAL DATA:  Patient recalled from screening for possible left breast architectural distortion. EXAM: DIGITAL DIAGNOSTIC LEFT MAMMOGRAM WITH 3D TOMOSYNTHESIS AND CAD COMPARISON:  Previous exam(s). ACR Breast Density Category c: The breast tissue is heterogeneously dense, which may obscure small masses. FINDINGS: Cc and MLO tomosynthesis images the left breast demonstrate the questioned distortion to resolve, compatible with overlapping fibroglandular breast tissue. No suspicious abnormality persists. Mammographic images were processed with CAD. IMPRESSION: Questioned left breast asymmetry resolved, compatible with overlapping fibroglandular breast tissue. No mammographic evidence for malignancy. RECOMMENDATION: Screening mammogram in one year.(Code:SM-B-01Y) I have discussed the findings and recommendations with the patient. Results were also provided in writing at the conclusion of the visit. If applicable, a reminder letter will be sent to the patient regarding the next appointment. BI-RADS CATEGORY  1: Negative. Electronically Signed   By: Lovey Newcomer M.D.   On: 05/21/2015 08:02    Assessment & Plan:   There are no diagnoses linked to this encounter. I have discontinued Amber Reilly methocarbamol, metroNIDAZOLE, fluconazole, metroNIDAZOLE, baclofen, and etodolac. I am also having her maintain her rizatriptan, Vitamin D3, and losartan-hydrochlorothiazide.  No orders of the defined types were placed in this encounter.    Follow-up: No Follow-up on file.  Walker Kehr, MD

## 2016-07-03 ENCOUNTER — Ambulatory Visit
Admission: RE | Admit: 2016-07-03 | Discharge: 2016-07-03 | Disposition: A | Discharge status: Home / Self Care | Payer: Managed Care, Other (non HMO) | Source: Ambulatory Visit | Attending: Internal Medicine | Admitting: Internal Medicine

## 2016-07-03 DIAGNOSIS — E049 Nontoxic goiter, unspecified: Secondary | ICD-10-CM

## 2016-07-04 ENCOUNTER — Other Ambulatory Visit: Payer: Self-pay | Admitting: Internal Medicine

## 2016-07-04 DIAGNOSIS — E041 Nontoxic single thyroid nodule: Secondary | ICD-10-CM

## 2016-07-04 NOTE — Telephone Encounter (Signed)
Pls call [facility], Her thyroid ultrasound shows 2 nodules that may need to be biopsied. I'll refer her to see a specialist for an expert opinion. Thx

## 2016-07-07 NOTE — Telephone Encounter (Signed)
Done. Thx.

## 2016-07-07 NOTE — Telephone Encounter (Signed)
Advised patient of dr plotnikovs note/instructions, routing back to dr plotnikov, patient is OK with sending referral to specialist---please send referral, thanks

## 2016-07-17 ENCOUNTER — Ambulatory Visit (INDEPENDENT_AMBULATORY_CARE_PROVIDER_SITE_OTHER): Payer: Managed Care, Other (non HMO) | Admitting: Endocrinology

## 2016-07-17 ENCOUNTER — Encounter: Payer: Self-pay | Admitting: Endocrinology

## 2016-07-17 DIAGNOSIS — E042 Nontoxic multinodular goiter: Secondary | ICD-10-CM

## 2016-07-17 LAB — TSH: TSH: 0.37 u[IU]/mL (ref 0.35–4.50)

## 2016-07-17 NOTE — Progress Notes (Signed)
Subjective:    Patient ID: Amber Reilly, female    DOB: [birth year], 51 y.o.   MRN: [id number]  HPI Pt is referred by [name], for nodular thyroid.  Pt was noted to have a thyroid nodule in 2006.  she has no h/o XRT or surgery to the neck.  She has slight post-nasal drip, but no assoc neck swelling.   Past Medical History:  Diagnosis Date  . Hypertension     Past Surgical History:  Procedure Laterality Date  . ENDOMETRIAL ABLATION      Social History   Social History  . Marital status: Married    Spouse name: N/A  . Number of children: N/A  . Years of education: N/A   Occupational History  . Not on file.   Social History Main Topics  . Smoking status: Never Smoker  . Smokeless tobacco: Never Used  . Alcohol use No  . Drug use: No  . Sexual activity: Not on file   Other Topics Concern  . Not on file   Social History Narrative  . No narrative on file    Current Outpatient Prescriptions on File Prior to Visit  Medication Sig Dispense Refill  . Cholecalciferol (VITAMIN D3) 2000 UNITS capsule Take 1 capsule (2,000 Units total) by mouth daily. 100 capsule 3  . losartan-hydrochlorothiazide (HYZAAR) 100-12.5 MG tablet Take 1 tablet by mouth daily. 90 tablet 3  . rizatriptan (MAXALT) 10 MG tablet Take 1 tablet (10 mg total) by mouth once as needed for migraine. May repeat in 2 hours if needed 12 tablet 3   No current facility-administered medications on file prior to visit.     Allergies  Allergen Reactions  . Latex   . Tramadol     Bad HA    Family History  Problem Relation Age of Onset  . Heart attack Father   . Cancer Other   . Goiter Paternal Aunt     BP (!) 142/90   Pulse 82   Ht 5\' 4"  (1.626 m)   Wt 223 lb 6.4 oz (101.3 kg)   SpO2 97%   BMI 38.35 kg/m    Review of Systems Denies weight change, hoarseness, neck pain, diplopia, chest pain, sob, cough, dysphagia, diarrhea, itching, flushing, easy bruising, anxiety, cold intolerance,  headache, numbness, and rhinorrhea.      Objective:   Physical Exam VS: see vs page GEN: no distress HEAD: head: no deformity eyes: no periorbital swelling, no proptosis external nose and ears are normal mouth: no lesion seen NECK: multinodular thyroid, approx 5 times normal size.   CHEST WALL: no deformity LUNGS: clear to auscultation CV: reg rate and rhythm, no murmur ABD: abdomen is soft, nontender.  no hepatosplenomegaly.  not distended.  no hernia MUSCULOSKELETAL: muscle bulk and strength are grossly normal.  no obvious joint swelling.  gait is normal and steady EXTEMITIES: no deformity.  no edema PULSES: no carotid bruit NEURO:  cn 2-12 grossly intact.   readily moves all 4's.  sensation is intact to touch on all 4's.  No tremor SKIN:  Normal texture and temperature.  No rash or suspicious lesion is visible.  Not diaphoretic NODES:  None palpable at the neck PSYCH: alert, well-oriented.  Does not appear anxious nor depressed.    Lab Results  Component Value Date   TSH 0.35 01/17/2015  Thyromegaly with bilateral nodules.   Cytology shows LEFT LOBE: BENIGN. FINDINGS CONSISTENT WITH A BENIGN FOLLICULAR NODULE.   I have reviewed  outside records, and summarized:  Pt was noted to have goiter, and referred here.  BP was high, due to med noncompliance.  Wellness was also addressed.      Assessment & Plan:  Multinodular goiter, new to me: slow growth over time.  This, along with cytol result, indicate low risk for malignancy.  Borderline low TSH: nuc med study would be of value to further eval nodules.   Patient Instructions  blood tests are requested for you today.  We'll let you know about the results. Let's check the nuclear medicine test.  you will receive a phone call, about a day and time for an appointment. Please return in 1 year.  most of the time, a "lumpy thyroid" will eventually become overactive.  this is usually a slow process, happening over the span of many  years.

## 2016-07-17 NOTE — Patient Instructions (Addendum)
blood tests are requested for you today.  We'll let you know about the results. Let's check the nuclear medicine test.  you will receive a phone call, about a day and time for an appointment. Please return in 1 year.  most of the time, a "lumpy thyroid" will eventually become overactive.  this is usually a slow process, happening over the span of many years.

## 2016-07-28 ENCOUNTER — Encounter (HOSPITAL_COMMUNITY)
Admission: RE | Admit: 2016-07-28 | Discharge: 2016-07-28 | Disposition: A | Discharge status: Home / Self Care | Payer: Managed Care, Other (non HMO) | Source: Ambulatory Visit | Attending: Endocrinology | Admitting: Endocrinology

## 2016-07-28 ENCOUNTER — Encounter (HOSPITAL_COMMUNITY): Payer: Self-pay

## 2016-07-28 DIAGNOSIS — E042 Nontoxic multinodular goiter: Secondary | ICD-10-CM

## 2016-07-29 ENCOUNTER — Encounter (HOSPITAL_COMMUNITY)
Admission: RE | Admit: 2016-07-29 | Discharge: 2016-07-29 | Disposition: A | Discharge status: Home / Self Care | Payer: Managed Care, Other (non HMO) | Source: Ambulatory Visit | Attending: Endocrinology | Admitting: Endocrinology

## 2016-07-29 ENCOUNTER — Encounter (HOSPITAL_COMMUNITY): Payer: Self-pay | Admitting: Radiology

## 2016-07-29 MED ORDER — SODIUM IODIDE I 131 CAPSULE
6.2000 | Freq: Once | INTRAVENOUS | Status: AC | PRN
  Administered 2016-07-28: 6.2 via ORAL

## 2016-07-29 MED ORDER — SODIUM PERTECHNETATE TC 99M INJECTION
10.4000 | Freq: Once | INTRAVENOUS | Status: AC | PRN
  Administered 2016-07-29: 10.4 via INTRAVENOUS

## 2016-08-11 ENCOUNTER — Encounter: Payer: Self-pay | Admitting: Internal Medicine

## 2016-08-15 ENCOUNTER — Other Ambulatory Visit: Payer: Self-pay | Admitting: Internal Medicine

## 2016-08-15 MED ORDER — HYDROCHLOROTHIAZIDE 12.5 MG PO CAPS: 12.5000 mg | ORAL_CAPSULE | Freq: Every day | ORAL | 3 refills | Status: DC

## 2016-08-19 ENCOUNTER — Encounter: Payer: Self-pay | Admitting: *Deleted

## 2016-08-19 ENCOUNTER — Emergency Department
Admission: EM | Admit: 2016-08-19 | Discharge: 2016-08-19 | Disposition: A | Discharge status: Home / Self Care | Payer: Self-pay | Source: Home / Self Care | Attending: Family Medicine | Admitting: Family Medicine

## 2016-08-19 DIAGNOSIS — R609 Edema, unspecified: Secondary | ICD-10-CM

## 2016-08-19 NOTE — Discharge Instructions (Signed)
Begin taking Microzide (HCTZ 12.5mg ) daily as prescribed each morning with your Hyzaar

## 2016-08-19 NOTE — ED Triage Notes (Signed)
Patient c/o 3 days of bilateral feet swelling. Today reports her hands are swollen. She emailed her PCP who sent in [place]. She has not filled this yet, she's been out of town.  She also c/o lip numbness and swelling last week that has now resolved. She is unsure what caused it.

## 2016-08-19 NOTE — ED Provider Notes (Signed)
[facility]    CSN: [id number] Arrival date & time: 08/19/16  1744     History   Chief Complaint Chief Complaint  Patient presents with  . Leg Swelling    HPI Amber Reilly is a 51 y.o. female.   Patient complains of several day history of swelling in her hands and feet that persists upon awakening each morning.  She denies increased salt intake.  No chest pain or shortness of breath.  She had contacted her PCP who has called in an Rx for Microzide (HCTZ 12.5mg ) daily, but she has not yet picked up her Rx.  She presently takes Losartan 50mg /HCTZ 12.5mg  once daily.      Past Medical History:  Diagnosis Date  . Hypertension     Patient Active Problem List   Diagnosis Date Noted  . Multinodular goiter 07/17/2016  . Migraine 06/30/2016  . Diaphoresis 01/03/2015  . Plantar fasciitis of left foot 09/16/2013  . LBP (low back pain) 09/05/2013  . Well adult exam 04/05/2013  . Hypertension 04/05/2013  . Right shoulder pain 03/16/2013  . Vaginitis and vulvovaginitis 03/15/2013  . Pain in joint, shoulder region 12/09/2012  . Discoloration of skin of lower leg 05/19/2012  . Right ankle sprain with tibialis anterior tendinosis 12/18/2011  . Hydradenitis 08/20/2011  . Abnormal TSH 06/11/2011  . Elevated blood pressure 05/15/2011  . ABSCESS, LEG 12/05/2009  . CANDIDIASIS OF MOUTH 10/10/2009  . DEPRESSION 12/26/2008  . EDEMA 04/06/2008  . WEIGHT GAIN 04/06/2008  . LARYNGITIS 08/17/2007  . ALLERGIC RHINITIS 08/17/2007  . PARONYCHIA 03/05/2007    Past Surgical History:  Procedure Laterality Date  . ENDOMETRIAL ABLATION      OB History    No data available       Home Medications    Prior to Admission medications   Medication Sig Start Date End Date Taking? Authorizing Provider  losartan-hydrochlorothiazide (HYZAAR) 100-12.5 MG tablet Take 1 tablet by mouth daily. 06/30/16  Yes Plotnikov, Evie Lacks, MD  Cholecalciferol (VITAMIN D3) 2000 UNITS capsule  Take 1 capsule (2,000 Units total) by mouth daily. 01/03/15   Plotnikov, Evie Lacks, MD  hydrochlorothiazide (MICROZIDE) 12.5 MG capsule Take 1 capsule (12.5 mg total) by mouth daily. 08/15/16   Plotnikov, Evie Lacks, MD  rizatriptan (MAXALT) 10 MG tablet Take 1 tablet (10 mg total) by mouth once as needed for migraine. May repeat in 2 hours if needed 06/30/16 06/30/17  Plotnikov, Evie Lacks, MD    Family History Family History  Problem Relation Age of Onset  . Heart attack Father   . Cancer Other   . Goiter Paternal Aunt     Social History Social History  Substance Use Topics  . Smoking status: Never Smoker  . Smokeless tobacco: Never Used  . Alcohol use No     Allergies   Latex and Tramadol   Review of Systems Review of Systems  Constitutional: Negative.   HENT: Negative.   Eyes: Negative.   Respiratory: Negative.   Cardiovascular: Positive for leg swelling. Negative for chest pain and palpitations.  Gastrointestinal: Negative.   Genitourinary: Negative.   Musculoskeletal: Negative.   Skin: Negative.   Neurological: Negative.      Physical Exam Triage Vital Signs ED Triage Vitals  Enc Vitals Group     BP 08/19/16 1806 132/87     Pulse Rate 08/19/16 1806 85     Resp --      Temp 08/19/16 1806 98.6 F (37 C)  Temp Source 08/19/16 1806 Oral     SpO2 08/19/16 1806 99 %     Weight 08/19/16 1806 231 lb (104.8 kg)     Height --      Head Circumference --      Peak Flow --      Pain Score 08/19/16 1807 0     Pain Loc --      Pain Edu? --      Excl. in [place]? --    No data found.   Updated Vital Signs BP 132/87 (BP Location: Left Arm)   Pulse 85   Temp 98.6 F (37 C) (Oral)   Wt 231 lb (104.8 kg)   SpO2 99%   BMI 39.65 kg/m   Visual Acuity Right Eye Distance:   Left Eye Distance:   Bilateral Distance:    Right Eye Near:   Left Eye Near:    Bilateral Near:     Physical Exam  Constitutional: She appears well-developed and well-nourished. No distress.    HENT:  Head: Normocephalic.  Right Ear: External ear normal.  Left Ear: External ear normal.  Nose: Nose normal.  Mouth/Throat: Oropharynx is clear and moist.  Eyes: Conjunctivae are normal. Pupils are equal, round, and reactive to light.  Cardiovascular: Normal heart sounds.   Pulmonary/Chest: Breath sounds normal.  Abdominal: There is no tenderness.  Musculoskeletal: She exhibits edema. She exhibits no tenderness.  Lymphadenopathy:    She has no cervical adenopathy.  Neurological: She is alert.  Skin: Skin is warm and dry.  Nursing note and vitals reviewed.    UC Treatments / Results  Labs (all labs ordered are listed, but only abnormal results are displayed) Labs Reviewed  BASIC METABOLIC PANEL    EKG  EKG Interpretation None       Radiology No results found.  Procedures Procedures (including critical care time)  Medications Ordered in UC Medications - No data to display   Initial Impression / Assessment and Plan / UC Course  I have reviewed the triage vital signs and the nursing notes.  Pertinent labs & imaging results that were available during my care of the patient were reviewed by me and considered in my medical decision making (see chart for details).    BMP pending. Begin taking Microzide (HCTZ 12.5mg ) daily as prescribed each morning with your Hyzaar Followup with [facility] as scheduled.    Final Clinical Impressions(s) / UC Diagnoses   Final diagnoses:  Peripheral edema    New Prescriptions New Prescriptions   No medications on file     Kandra Nicolas, MD 08/19/16 [phone]

## 2016-08-20 ENCOUNTER — Telehealth: Payer: Self-pay

## 2016-08-20 LAB — BASIC METABOLIC PANEL
BUN: 14 mg/dL (ref 7–25)
CALCIUM: 9.1 mg/dL (ref 8.6–10.4)
CHLORIDE: 102 mmol/L (ref 98–110)
CO2: 28 mmol/L (ref 20–31)
CREATININE: 0.88 mg/dL (ref 0.50–1.05)
GLUCOSE: 97 mg/dL (ref 65–99)
Potassium: 3.8 mmol/L (ref 3.5–5.3)
Sodium: 139 mmol/L (ref 135–146)

## 2016-08-20 NOTE — Telephone Encounter (Signed)
Notified pt of lab work, and she is following up with PCP.

## 2016-09-22 ENCOUNTER — Emergency Department
Admission: EM | Admit: 2016-09-22 | Discharge: 2016-09-22 | Disposition: A | Discharge status: Home / Self Care | Payer: BLUE CROSS/BLUE SHIELD | Source: Home / Self Care | Attending: Family Medicine | Admitting: Family Medicine

## 2016-09-22 ENCOUNTER — Encounter: Payer: Self-pay | Admitting: Emergency Medicine

## 2016-09-22 DIAGNOSIS — H60332 Swimmer's ear, left ear: Secondary | ICD-10-CM | POA: Diagnosis not present

## 2016-09-22 MED ORDER — NEOMYCIN-POLYMYXIN-HC 3.5-10000-1 OT SUSP: 4.0000 [drp] | Freq: Three times a day (TID) | OTIC | 0 refills | Status: DC

## 2016-09-22 NOTE — ED Provider Notes (Signed)
CSN: [id number]     Arrival date & time 09/22/16  1011 History   First MD Initiated Contact with Patient 09/22/16 1053     Chief Complaint  Patient presents with  . Otalgia   (Consider location/radiation/quality/duration/timing/severity/associated sxs/prior Treatment) HPI  Amber Reilly is a 51 y.o. female presenting to UC with c/o Left ear pain that started yesterday after she went onto a water slide.  Pt notes she did get water in her ear. Pain is aching and sore, 5/10. She has not taken anything for her pain. Denies fever, chills, n/v/d. No cough or sore throat.    Past Medical History:  Diagnosis Date  . Hypertension    Past Surgical History:  Procedure Laterality Date  . ENDOMETRIAL ABLATION     Family History  Problem Relation Age of Onset  . Heart attack Father   . Cancer Other   . Goiter Paternal Aunt    Social History  Substance Use Topics  . Smoking status: Never Smoker  . Smokeless tobacco: Never Used  . Alcohol use No   OB History    No data available     Review of Systems  Constitutional: Negative for chills and fever.  HENT: Positive for ear pain ( Left). Negative for congestion, sore throat, trouble swallowing and voice change.   Respiratory: Negative for cough and shortness of breath.   Cardiovascular: Negative for chest pain and palpitations.  Gastrointestinal: Negative for abdominal pain, diarrhea, nausea and vomiting.  Musculoskeletal: Negative for arthralgias, back pain and myalgias.  Skin: Negative for rash.    Allergies  Latex and Tramadol  Home Medications   Prior to Admission medications   Medication Sig Start Date End Date Taking? Authorizing Provider  Cholecalciferol (VITAMIN D3) 2000 UNITS capsule Take 1 capsule (2,000 Units total) by mouth daily. 01/03/15   Plotnikov, Evie Lacks, MD  hydrochlorothiazide (MICROZIDE) 12.5 MG capsule Take 1 capsule (12.5 mg total) by mouth daily. 08/15/16   Plotnikov, Evie Lacks, MD   losartan-hydrochlorothiazide (HYZAAR) 100-12.5 MG tablet Take 1 tablet by mouth daily. 06/30/16   Plotnikov, Evie Lacks, MD  neomycin-polymyxin-hydrocortisone (CORTISPORIN) 3.5-10000-1 OTIC suspension Place 4 drops into the left ear 3 (three) times daily. For 7 days 09/22/16   Noe Gens, PA-C  rizatriptan (MAXALT) 10 MG tablet Take 1 tablet (10 mg total) by mouth once as needed for migraine. May repeat in 2 hours if needed 06/30/16 06/30/17  Plotnikov, Evie Lacks, MD   Meds Ordered and Administered this Visit  Medications - No data to display  BP (!) 136/91 (BP Location: Left Arm)   Pulse 87   Temp 98.6 F (37 C) (Oral)   Ht 5\' 4"  (1.626 m)   Wt 227 lb (103 kg)   SpO2 97%   BMI 38.96 kg/m  No data found.   Physical Exam  Constitutional: She is oriented to person, place, and time. She appears well-developed and well-nourished. No distress.  HENT:  Head: Normocephalic and atraumatic.  Right Ear: Tympanic membrane normal.  Left Ear: Tympanic membrane normal. There is swelling and tenderness. No drainage. Tympanic membrane is not erythematous and not bulging.  No middle ear effusion.  Nose: Nose normal.  Mouth/Throat: Uvula is midline, oropharynx is clear and moist and mucous membranes are normal.  Left ear: mild edema in ear canal with tenderness on exam.   Eyes: EOM are normal.  Neck: Normal range of motion.  Cardiovascular: Normal rate.   Pulmonary/Chest: Effort normal.  Musculoskeletal: Normal range  of motion.  Neurological: She is alert and oriented to person, place, and time.  Skin: Skin is warm and dry. She is not diaphoretic.  Psychiatric: She has a normal mood and affect. Her behavior is normal.  Nursing note and vitals reviewed.   Urgent Care Course     Procedures (including critical care time)  Labs Review Labs Reviewed - No data to display  Imaging Review No results found.    MDM   1. Acute swimmer's ear of left side    Hx and exam c/w Left acute  swimmer's ear  Rx: cortisporin drops for 7 days F/u with PCP in 1 week if not improving.     Noe Gens, PA-C 09/22/16 1210

## 2016-09-22 NOTE — ED Triage Notes (Signed)
Left ear pain after swimming yesterday

## 2016-09-29 ENCOUNTER — Ambulatory Visit: Payer: BLUE CROSS/BLUE SHIELD | Admitting: Internal Medicine

## 2017-02-05 ENCOUNTER — Encounter: Payer: Self-pay | Admitting: Internal Medicine

## 2017-03-25 ENCOUNTER — Encounter: Payer: Self-pay | Admitting: Internal Medicine

## 2017-07-14 ENCOUNTER — Encounter: Payer: Self-pay | Admitting: Internal Medicine

## 2017-07-14 ENCOUNTER — Ambulatory Visit: Payer: No Typology Code available for payment source | Admitting: Internal Medicine

## 2017-07-14 VITALS — BP 138/82 | HR 81 | Temp 98.0°F | Ht 64.0 in | Wt 232.0 lb

## 2017-07-14 DIAGNOSIS — Z0000 Encounter for general adult medical examination without abnormal findings: Secondary | ICD-10-CM

## 2017-07-14 DIAGNOSIS — R222 Localized swelling, mass and lump, trunk: Secondary | ICD-10-CM | POA: Diagnosis not present

## 2017-07-14 DIAGNOSIS — E669 Obesity, unspecified: Secondary | ICD-10-CM | POA: Diagnosis not present

## 2017-07-14 DIAGNOSIS — Z1211 Encounter for screening for malignant neoplasm of colon: Secondary | ICD-10-CM

## 2017-07-14 DIAGNOSIS — R635 Abnormal weight gain: Secondary | ICD-10-CM

## 2017-07-14 MED ORDER — TELMISARTAN-HCTZ 40-12.5 MG PO TABS: 1.0000 | ORAL_TABLET | Freq: Every day | ORAL | 3 refills | Status: DC

## 2017-07-14 NOTE — Assessment & Plan Note (Signed)
Ref to Dr Leafy Ro BMI 39

## 2017-07-14 NOTE — Progress Notes (Signed)
Subjective:  Patient ID: [name], female    DOB: 14-Oct-1965  Age: 52 y.o. MRN: [id number]  CC: No chief complaint on file.   HPI RISSIE SCULLEY presents for a well exam F/u HTN C/o wt gain 30 lbs C/o lump on abd  Outpatient Medications Prior to Visit  Medication Sig Dispense Refill  . Cholecalciferol (VITAMIN D3) 2000 UNITS capsule Take 1 capsule (2,000 Units total) by mouth daily. 100 capsule 3  . hydrochlorothiazide (MICROZIDE) 12.5 MG capsule Take 1 capsule (12.5 mg total) by mouth daily. 90 capsule 3  . losartan-hydrochlorothiazide (HYZAAR) 100-12.5 MG tablet Take 1 tablet by mouth daily. 90 tablet 3  . neomycin-polymyxin-hydrocortisone (CORTISPORIN) 3.5-10000-1 OTIC suspension Place 4 drops into the left ear 3 (three) times daily. For 7 days 10 mL 0  . rizatriptan (MAXALT) 10 MG tablet Take 1 tablet (10 mg total) by mouth once as needed for migraine. May repeat in 2 hours if needed 12 tablet 3   No facility-administered medications prior to visit.     ROS Review of Systems  Constitutional: Positive for unexpected weight change. Negative for activity change, appetite change, chills and fatigue.  HENT: Negative for congestion, mouth sores and sinus pressure.   Eyes: Negative for visual disturbance.  Respiratory: Negative for cough and chest tightness.   Gastrointestinal: Negative for abdominal pain and nausea.  Genitourinary: Negative for difficulty urinating, frequency and vaginal pain.  Musculoskeletal: Negative for back pain and gait problem.  Skin: Negative for pallor and rash.  Neurological: Negative for dizziness, tremors, weakness, numbness and headaches.  Psychiatric/Behavioral: Negative for confusion, sleep disturbance and suicidal ideas.    Objective:  BP 138/82 (BP Location: Left Arm, Patient Position: Sitting, Cuff Size: Large)   Pulse 81   Temp 98 F (36.7 C) (Oral)   Ht 5\' 4"  (1.626 m)   Wt 232 lb (105.2 kg)   SpO2 98%   BMI 39.82 kg/m    BP Readings from Last 3 Encounters:  07/14/17 138/82  09/22/16 (!) 136/91  08/19/16 132/87    Wt Readings from Last 3 Encounters:  07/14/17 232 lb (105.2 kg)  09/22/16 227 lb (103 kg)  08/19/16 231 lb (104.8 kg)    Physical Exam  Constitutional: She appears well-developed. No distress.  HENT:  Head: Normocephalic.  Right Ear: External ear normal.  Left Ear: External ear normal.  Nose: Nose normal.  Mouth/Throat: Oropharynx is clear and moist.  Eyes: Pupils are equal, round, and reactive to light. Conjunctivae are normal. Right eye exhibits no discharge. Left eye exhibits no discharge.  Neck: Normal range of motion. Neck supple. No JVD present. No tracheal deviation present. No thyromegaly present.  Cardiovascular: Normal rate, regular rhythm and normal heart sounds.  Pulmonary/Chest: No stridor. No respiratory distress. She has no wheezes.  Abdominal: Soft. Bowel sounds are normal. She exhibits mass. She exhibits no distension. There is no tenderness. There is no rebound and no guarding.  Musculoskeletal: She exhibits no edema or tenderness.  Lymphadenopathy:    She has no cervical adenopathy.  Neurological: She displays normal reflexes. No cranial nerve deficit. She exhibits normal muscle tone. Coordination normal.  Skin: No rash noted. No erythema.  Psychiatric: She has a normal mood and affect. Her behavior is normal. Judgment and thought content normal.  Obese Oval sq  lump 1x2 cm at 7o'clock - in the umbilicus - sensitive Lab Results  Component Value Date   WBC 5.6 01/17/2015   HGB 12.1 01/17/2015  HCT 37.6 01/17/2015   PLT 252.0 01/17/2015   GLUCOSE 97 08/19/2016   CHOL 208 (H) 01/17/2015   TRIG 344.0 (H) 01/17/2015   HDL 55.20 01/17/2015   LDLDIRECT 119.0 01/17/2015   LDLCALC 130 (H) 12/24/2011   ALT 14 01/17/2015   AST 16 01/17/2015   NA 139 08/19/2016   K 3.8 08/19/2016   CL 102 08/19/2016   CREATININE 0.88 08/19/2016   BUN 14 08/19/2016   CO2 28  08/19/2016   TSH 0.37 07/17/2016    No results found.  Assessment & Plan:   There are no diagnoses linked to this encounter. I am having Jovanna L. Peralta maintain her Vitamin D3, losartan-hydrochlorothiazide, rizatriptan, hydrochlorothiazide, and neomycin-polymyxin-hydrocortisone.  No orders of the defined types were placed in this encounter.    Follow-up: No follow-ups on file.  Walker Kehr, MD

## 2017-07-14 NOTE — Assessment & Plan Note (Signed)
Oval sq  lump 1x2cm at 7o'clock - in the umbilicus - sensitive ?etiology Surg ref

## 2017-07-14 NOTE — Assessment & Plan Note (Signed)
We discussed age appropriate health related issues, including available/recomended screening tests and vaccinations. We discussed a need for adhering to healthy diet and exercise. Labs/EKG were reviewed/ordered. All questions were answered.  Mammo q 12 mo LMP - Oct 2014 GYN q 12 mo - she will schedule

## 2017-07-17 ENCOUNTER — Ambulatory Visit: Payer: Managed Care, Other (non HMO) | Admitting: Endocrinology

## 2017-07-21 ENCOUNTER — Encounter: Payer: Self-pay | Admitting: Internal Medicine

## 2017-07-21 ENCOUNTER — Encounter: Payer: BLUE CROSS/BLUE SHIELD | Admitting: Internal Medicine

## 2017-08-03 ENCOUNTER — Other Ambulatory Visit: Payer: Self-pay

## 2017-08-03 ENCOUNTER — Ambulatory Visit (AMBULATORY_SURGERY_CENTER): Payer: Self-pay | Admitting: *Deleted

## 2017-08-03 VITALS — Ht 64.0 in | Wt 237.0 lb

## 2017-08-03 DIAGNOSIS — Z1211 Encounter for screening for malignant neoplasm of colon: Secondary | ICD-10-CM

## 2017-08-03 MED ORDER — NA SULFATE-K SULFATE-MG SULF 17.5-3.13-1.6 GM/177ML PO SOLN: 1.0000 | Freq: Once | ORAL | 0 refills | Status: AC

## 2017-08-03 NOTE — Progress Notes (Signed)
Patient denies any allergies to eggs or soy. Patient denies any problems with anesthesia/sedation. Patient denies any oxygen use at home. Patient denies taking any diet/weight loss medications or blood thinners. EMMI education assisgned to patient on colonoscopy, this was explained and instructions given to patient. 

## 2017-08-06 ENCOUNTER — Encounter: Payer: Self-pay | Admitting: Endocrinology

## 2017-08-06 ENCOUNTER — Ambulatory Visit (INDEPENDENT_AMBULATORY_CARE_PROVIDER_SITE_OTHER): Payer: No Typology Code available for payment source | Admitting: Endocrinology

## 2017-08-06 VITALS — BP 132/82 | HR 96 | Wt 234.4 lb

## 2017-08-06 DIAGNOSIS — E042 Nontoxic multinodular goiter: Secondary | ICD-10-CM | POA: Diagnosis not present

## 2017-08-06 LAB — TSH: TSH: 0.37 u[IU]/mL (ref 0.35–4.50)

## 2017-08-06 LAB — T4, FREE: FREE T4: 0.93 ng/dL (ref 0.60–1.60)

## 2017-08-06 NOTE — Patient Instructions (Addendum)
Thyroid blood tests are requested for you today.  We'll let you know about the results.  If it is overactive, you should take the radioactive iodine pill:  Based on the results, I may need to order for you a treatment pill of radioactive iodine.  Although it is a larger amount of radiation, you will again notice no symptoms from this.  The pill is gone from your body in a few days (during which you should stay away from other people), but takes several months to work.  Therefore, please return here approximately 6-8 weeks after the treatment.  This treatment has been available for many years, and the only known side-effect is an underactive thyroid.  It is possible that i would eventually prescribe for you a thyroid hormone pill, which is very inexpensive.  You don't have to worry about side-effects of this thyroid hormone pill, because it is the same molecule your thyroid makes.   You can skip the ultrasound this year.  Please return in 1 year.  most of the time, a "lumpy thyroid" will eventually become overactive.  this is usually a slow process, happening over the span of many years.

## 2017-08-06 NOTE — Progress Notes (Signed)
Subjective:    Patient ID: Amber Reilly, female    DOB: 1965/05/14, 52 y.o.   MRN: [id number]  HPI Pt returns for f/u of multinodular goiter (dx'ed 2006; bx in 2013 showed benign follicular nodule; f/u US in 2018 was unchanged; nuc med scan showed multinodular goiter, but no hot or cold nodule; TSH has been borderline low).She does not notice the goiter.   Past Medical History:  Diagnosis Date  . Hypertension     Past Surgical History:  Procedure Laterality Date  . ENDOMETRIAL ABLATION      Social History   Socioeconomic History  . Marital status: Married    Spouse name: Not on file  . Number of children: Not on file  . Years of education: Not on file  . Highest education level: Not on file  Occupational History  . Not on file  Social Needs  . Financial resource strain: Not on file  . Food insecurity:    Worry: Not on file    Inability: Not on file  . Transportation needs:    Medical: Not on file    Non-medical: Not on file  Tobacco Use  . Smoking status: Never Smoker  . Smokeless tobacco: Never Used  Substance and Sexual Activity  . Alcohol use: No  . Drug use: No  . Sexual activity: Not on file  Lifestyle  . Physical activity:    Days per week: Not on file    Minutes per session: Not on file  . Stress: Not on file  Relationships  . Social connections:    Talks on phone: Not on file    Gets together: Not on file    Attends religious service: Not on file    Active member of club or organization: Not on file    Attends meetings of clubs or organizations: Not on file    Relationship status: Not on file  . Intimate partner violence:    Fear of current or ex partner: Not on file    Emotionally abused: Not on file    Physically abused: Not on file    Forced sexual activity: Not on file  Other Topics Concern  . Not on file  Social History Narrative  . Not on file    Current Outpatient Medications on File Prior to Visit  Medication Sig Dispense Refill    . Cholecalciferol (VITAMIN D3) 2000 UNITS capsule Take 1 capsule (2,000 Units total) by mouth daily. 100 capsule 3  . telmisartan-hydrochlorothiazide (MICARDIS HCT) 40-12.5 MG tablet Take 1 tablet by mouth daily. 90 tablet 3  . rizatriptan (MAXALT) 10 MG tablet Take 1 tablet (10 mg total) by mouth once as needed for migraine. May repeat in 2 hours if needed 12 tablet 3   No current facility-administered medications on file prior to visit.     Allergies  Allergen Reactions  . Latex   . Tramadol     Bad HA    Family History  Problem Relation Age of Onset  . Heart attack Father   . Cancer Other   . Goiter Paternal Aunt   . Colon cancer Neg Hx   . Esophageal cancer Neg Hx   . Stomach cancer Neg Hx     BP 132/82   Pulse 96   Wt 234 lb 6.4 oz (106.3 kg)   SpO2 97%   BMI 40.23 kg/m   Review of Systems Denies neck pain    Objective:   Physical Exam VS: see vs  page GEN: no distress eyes: no periorbital swelling, no proptosis NECK: multinodular thyroid, approx 5-10 times normal size.       Assessment & Plan:  Multinodular goiter, clinically stable Borderline low TSH: recheck today.   Patient Instructions  Thyroid blood tests are requested for you today.  We'll let you know about the results.  If it is overactive, you should take the radioactive iodine pill:  Based on the results, I may need to order for you a treatment pill of radioactive iodine.  Although it is a larger amount of radiation, you will again notice no symptoms from this.  The pill is gone from your body in a few days (during which you should stay away from other people), but takes several months to work.  Therefore, please return here approximately 6-8 weeks after the treatment.  This treatment has been available for many years, and the only known side-effect is an underactive thyroid.  It is possible that i would eventually prescribe for you a thyroid hormone pill, which is very inexpensive.  You don't have  to worry about side-effects of this thyroid hormone pill, because it is the same molecule your thyroid makes.   You can skip the ultrasound this year.  Please return in 1 year.  most of the time, a "lumpy thyroid" will eventually become overactive.  this is usually a slow process, happening over the span of many years.

## 2017-08-07 ENCOUNTER — Encounter: Payer: Self-pay | Admitting: Internal Medicine

## 2017-08-07 NOTE — Telephone Encounter (Signed)
Please advise 

## 2017-08-15 ENCOUNTER — Other Ambulatory Visit: Payer: Self-pay | Admitting: Internal Medicine

## 2017-08-17 ENCOUNTER — Ambulatory Visit (AMBULATORY_SURGERY_CENTER): Payer: No Typology Code available for payment source | Admitting: Internal Medicine

## 2017-08-17 ENCOUNTER — Encounter: Payer: Self-pay | Admitting: Internal Medicine

## 2017-08-17 ENCOUNTER — Other Ambulatory Visit: Payer: Self-pay

## 2017-08-17 VITALS — BP 115/76 | HR 77 | Temp 97.8°F | Resp 20 | Ht 64.0 in | Wt 237.0 lb

## 2017-08-17 DIAGNOSIS — Z1211 Encounter for screening for malignant neoplasm of colon: Secondary | ICD-10-CM | POA: Diagnosis not present

## 2017-08-17 DIAGNOSIS — D120 Benign neoplasm of cecum: Secondary | ICD-10-CM | POA: Diagnosis not present

## 2017-08-17 MED ORDER — SODIUM CHLORIDE 0.9 % IV SOLN: 500.0000 mL | Freq: Once | INTRAVENOUS | Status: DC

## 2017-08-17 NOTE — Progress Notes (Signed)
No changes in medical or surgical hx since PV per pt 

## 2017-08-17 NOTE — Progress Notes (Signed)
Called to room to assist during endoscopic procedure.  Patient ID and intended procedure confirmed with present staff. Received instructions for my participation in the procedure from the performing physician.  

## 2017-08-17 NOTE — Progress Notes (Signed)
Report given to PACU, vss 

## 2017-08-17 NOTE — Patient Instructions (Signed)
YOU HAD AN ENDOSCOPIC PROCEDURE TODAY AT THE [facility]:   Refer to the procedure report that was given to you for any specific questions about what was found during the examination.  If the procedure report does not answer your questions, please call your gastroenterologist to clarify.  If you requested that your care partner not be given the details of your procedure findings, then the procedure report has been included in a sealed envelope for you to review at your convenience later.  YOU SHOULD EXPECT: Some feelings of bloating in the abdomen. Passage of more gas than usual.  Walking can help get rid of the air that was put into your GI tract during the procedure and reduce the bloating. If you had a lower endoscopy (such as a colonoscopy or flexible sigmoidoscopy) you may notice spotting of blood in your stool or on the toilet paper. If you underwent a bowel prep for your procedure, you may not have a normal bowel movement for a few days.  Please Note:  You might notice some irritation and congestion in your nose or some drainage.  This is from the oxygen used during your procedure.  There is no need for concern and it should clear up in a day or so.  SYMPTOMS TO REPORT IMMEDIATELY:   Following lower endoscopy (colonoscopy or flexible sigmoidoscopy):  Excessive amounts of blood in the stool  Significant tenderness or worsening of abdominal pains  Swelling of the abdomen that is new, acute  Fever of 100F or higher  For urgent or emergent issues, a gastroenterologist can be reached at any hour by calling [phone].   DIET:  We do recommend a small meal at first, but then you may proceed to your regular diet.  Drink plenty of fluids but you should avoid alcoholic beverages for 24 hours.  MEDICATIONS: Continue present medications.  Please see handouts given to you by your recovery nurse.  ACTIVITY:  You should plan to take it easy for the rest of today and you should NOT  DRIVE or use heavy machinery until tomorrow (because of the sedation medicines used during the test).    FOLLOW UP: Our staff will call the number listed on your records the next business day following your procedure to check on you and address any questions or concerns that you may have regarding the information given to you following your procedure. If we do not reach you, we will leave a message.  However, if you are feeling well and you are not experiencing any problems, there is no need to return our call.  We will assume that you have returned to your regular daily activities without incident.  If any biopsies were taken you will be contacted by phone or by letter within the next 1-3 weeks.  Please call us at [phone] if you have not heard about the biopsies in 3 weeks.   Thank you for allowing us to provide for your healthcare needs today.   SIGNATURES/CONFIDENTIALITY: You and/or your care partner have signed paperwork which will be entered into your electronic medical record.  These signatures attest to the fact that that the information above on your After Visit Summary has been reviewed and is understood.  Full responsibility of the confidentiality of this discharge information lies with you and/or your care-partner. 

## 2017-08-17 NOTE — Op Note (Signed)
[facility] Patient Name: Amber Reilly Procedure Date: 08/17/2017 7:52 AM MRN: [id number] Endoscopist: Jerene Bears , MD Age: 52 Referring MD:  Date of Birth: 1965-12-30 Gender: Female Account #: [id number] Procedure:                Colonoscopy Indications:              Screening for colorectal malignant neoplasm, This                            is the patient's first colonoscopy Medicines:                Monitored Anesthesia Care Procedure:                Pre-Anesthesia Assessment:                           - Prior to the procedure, a History and Physical                            was performed, and patient medications and                            allergies were reviewed. The patient's tolerance of                            previous anesthesia was also reviewed. The risks                            and benefits of the procedure and the sedation                            options and risks were discussed with the patient.                            All questions were answered, and informed consent                            was obtained. Prior Anticoagulants: The patient has                            taken no previous anticoagulant or antiplatelet                            agents. ASA Grade Assessment: II - A patient with                            mild systemic disease. After reviewing the risks                            and benefits, the patient was deemed in                            satisfactory condition to undergo the procedure.  After obtaining informed consent, the colonoscope                            was passed under direct vision. Throughout the                            procedure, the patient's blood pressure, pulse, and                            oxygen saturations were monitored continuously. The                            Colonoscope was introduced through the anus and                            advanced to the cecum,  identified by appendiceal                            orifice and ileocecal valve. The colonoscopy was                            performed without difficulty. The patient tolerated                            the procedure well. The quality of the bowel                            preparation was good. The ileocecal valve,                            appendiceal orifice, and rectum were photographed. Scope In: 8:19:35 AM Scope Out: 8:33:19 AM Scope Withdrawal Time: 0 hours 10 minutes 43 seconds  Total Procedure Duration: 0 hours 13 minutes 44 seconds  Findings:                 The digital rectal exam was normal.                           A 6 mm polyp was found in the cecum. The polyp was                            sessile. The polyp was removed with a cold snare.                            Resection and retrieval were complete.                           The exam was otherwise without abnormality.                            Retroflexion was not performed in the rectum due to                            narrow rectal vault. Views  were obtained from the                            distal rectum and anorectal verge with multiple                            passes and no abnormalities were identified. Complications:            No immediate complications. Estimated Blood Loss:     Estimated blood loss was minimal. Impression:               - One 6 mm polyp in the cecum, removed with a cold                            snare. Resected and retrieved.                           - The examination was otherwise normal. Recommendation:           - Patient has a contact number available for                            emergencies. The signs and symptoms of potential                            delayed complications were discussed with the                            patient. Return to normal activities tomorrow.                            Written discharge instructions were provided to the                             patient.                           - Resume previous diet.                           - Continue present medications.                           - Await pathology results.                           - Repeat colonoscopy is recommended. The                            colonoscopy date will be determined after pathology                            results from today's exam become available for                            review. Jerene Bears, MD 08/17/2017 8:37:20 AM This report has been signed  electronically.

## 2017-08-18 ENCOUNTER — Telehealth: Payer: Self-pay | Admitting: *Deleted

## 2017-08-18 NOTE — Telephone Encounter (Signed)
  Follow up Call-  Call back number 08/17/2017  Post procedure Call Back phone  # [phone]  Permission to leave phone message Yes  Some recent data might be hidden     Patient questions:  Do you have a fever, pain , or abdominal swelling? No. Pain Score  0 *  Have you tolerated food without any problems? Yes.    Have you been able to return to your normal activities? Yes.    Do you have any questions about your discharge instructions: Diet   No. Medications  No. Follow up visit  No.  Do you have questions or concerns about your Care? No.  Actions: * If pain score is 4 or above: No action needed, pain <4.

## 2017-08-21 ENCOUNTER — Encounter: Payer: Self-pay | Admitting: Internal Medicine

## 2017-10-12 ENCOUNTER — Other Ambulatory Visit: Payer: Self-pay | Admitting: General Surgery

## 2017-10-12 DIAGNOSIS — K432 Incisional hernia without obstruction or gangrene: Secondary | ICD-10-CM

## 2017-11-05 ENCOUNTER — Inpatient Hospital Stay: Payer: BLUE CROSS/BLUE SHIELD

## 2017-11-05 ENCOUNTER — Encounter: Payer: Self-pay | Admitting: Gynecologic Oncology

## 2017-11-05 ENCOUNTER — Inpatient Hospital Stay: Payer: BLUE CROSS/BLUE SHIELD | Attending: Gynecologic Oncology | Admitting: Gynecologic Oncology

## 2017-11-05 ENCOUNTER — Ambulatory Visit: Payer: No Typology Code available for payment source | Admitting: Gynecologic Oncology

## 2017-11-05 VITALS — BP 130/88 | HR 74 | Temp 98.5°F | Resp 18 | Ht 64.0 in | Wt 231.8 lb

## 2017-11-05 DIAGNOSIS — K429 Umbilical hernia without obstruction or gangrene: Secondary | ICD-10-CM | POA: Insufficient documentation

## 2017-11-05 DIAGNOSIS — R1900 Intra-abdominal and pelvic swelling, mass and lump, unspecified site: Secondary | ICD-10-CM | POA: Insufficient documentation

## 2017-11-05 DIAGNOSIS — N83201 Unspecified ovarian cyst, right side: Secondary | ICD-10-CM

## 2017-11-05 LAB — CEA (IN HOUSE-CHCC): CEA (CHCC-IN HOUSE): 1.3 ng/mL (ref 0.00–5.00)

## 2017-11-05 NOTE — Patient Instructions (Signed)
Preparing for your Surgery  Plan for surgery on November 26, 2017 with Dr. Emmaline Life at [facility] will be scheduled for an exploratory laparotomy, unilateral salpingo-oophorectomy, umbilical hernia repair.  Pre-operative Testing -You will receive a phone call from presurgical testing at [facility] to arrange for a pre-operative testing appointment before your surgery.  This appointment normally occurs one to two weeks before your scheduled surgery.   -Bring your insurance card, copy of an advanced directive if applicable, medication list  -At that visit, you will be asked to sign a consent for a possible blood transfusion in case a transfusion becomes necessary during surgery.  The need for a blood transfusion is rare but having consent is a necessary part of your care.     -You should not be taking blood thinners or aspirin at least ten days prior to surgery unless instructed by your surgeon.  Day Before Surgery at [facility] will be asked to take in a light diet the day before surgery.  Avoid carbonated beverages.  You will be advised to have nothing to eat or drink after midnight the evening before.    Eat a light diet the day before surgery.  Examples including soups, broths, toast, yogurt, mashed potatoes.  Things to avoid include carbonated beverages (fizzy beverages), raw fruits and raw vegetables, or beans.   If your bowels are filled with gas, your surgeon will have difficulty visualizing your pelvic organs which increases your surgical risks.  Your role in recovery Your role is to become active as soon as directed by your doctor, while still giving yourself time to heal.  Rest when you feel tired. You will be asked to do the following in order to speed your recovery:  - Cough and breathe deeply. This helps toclear and expand your lungs and can prevent pneumonia. You may be given a spirometer to practice deep breathing. A staff member will show  you how to use the spirometer. - Do mild physical activity. Walking or moving your legs help your circulation and body functions return to normal. A staff member will help you when you try to walk and will provide you with simple exercises. Do not try to get up or walk alone the first time. - Actively manage your pain. Managing your pain lets you move in comfort. We will ask you to rate your pain on a scale of zero to 10. It is your responsibility to tell your doctor or nurse where and how much you hurt so your pain can be treated.  Special Considerations -If you are diabetic, you may be placed on insulin after surgery to have closer control over your blood sugars to promote healing and recovery.  This does not mean that you will be discharged on insulin.  If applicable, your oral antidiabetics will be resumed when you are tolerating a solid diet.  -Your final pathology results from surgery should be available by the Friday after surgery and the results will be relayed to you when available.  -Dr. Lahoma Crocker is the Surgeon that assists your GYN Oncologist with surgery.  The next day after your surgery you will either see your GYN Oncologist or Dr. Lahoma Crocker.   Blood Transfusion Information WHAT IS A BLOOD TRANSFUSION? A transfusion is the replacement of blood or some of its parts. Blood is made up of multiple cells which provide different functions.  Red blood cells carry oxygen and are used for blood loss replacement.  White blood cells fight against infection.  Platelets control bleeding.  Plasma helps clot blood.  Other blood products are available for specialized needs, such as hemophilia or other clotting disorders. BEFORE THE TRANSFUSION  Who gives blood for transfusions?   You may be able to donate blood to be used at a later date on yourself (autologous donation).  Relatives can be asked to donate blood. This is generally not any safer than if you have received  blood from a stranger. The same precautions are taken to ensure safety when a relative's blood is donated.  Healthy volunteers who are fully evaluated to make sure their blood is safe. This is blood bank blood. Transfusion therapy is the safest it has ever been in the practice of medicine. Before blood is taken from a donor, a complete history is taken to make sure that person has no history of diseases nor engages in risky social behavior (examples are intravenous drug use or sexual activity with multiple partners). The donor's travel history is screened to minimize risk of transmitting infections, such as malaria. The donated blood is tested for signs of infectious diseases, such as HIV and hepatitis. The blood is then tested to be sure it is compatible with you in order to minimize the chance of a transfusion reaction. If you or a relative donates blood, this is often done in anticipation of surgery and is not appropriate for emergency situations. It takes many days to process the donated blood. RISKS AND COMPLICATIONS Although transfusion therapy is very safe and saves many lives, the main dangers of transfusion include:   Getting an infectious disease.  Developing a transfusion reaction. This is an allergic reaction to something in the blood you were given. Every precaution is taken to prevent this. The decision to have a blood transfusion has been considered carefully by your caregiver before blood is given. Blood is not given unless the benefits outweigh the risks.
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
## 2017-11-06 ENCOUNTER — Telehealth: Payer: Self-pay | Admitting: *Deleted

## 2017-11-06 LAB — CA 125: Cancer Antigen (CA) 125: 5.9 U/mL (ref 0.0–38.1)

## 2017-11-06 NOTE — Telephone Encounter (Signed)
Called patient to tell her the results of her CA-125.  Per [name], NP the result is 5.9 and this is within normal limits.  Patient was very appreciative and verbalized understanding.

## 2017-11-09 NOTE — Telephone Encounter (Signed)
Told [name] that the CEA from 11-05-17 was in normal range at 1.3 per [name], NP.

## 2017-11-17 NOTE — Progress Notes (Signed)
Need orders in epic.  Preop on 11/19/2017.  Thanks.

## 2017-11-18 NOTE — Patient Instructions (Signed)
WANIA LONGSTRETH  11/18/2017   Your procedure is scheduled on: 11/26/2017   Report to [facility] Main  Entrance  Report to admitting at    [place] AM    Call this number if you have problems the morning of surgery [phone]   Remember: Do not eat food  :After Midnight. Eat a light diet the day before surgery.  Examples include: soups, toast, broths, yogurt and mashed potatoes.  Things to avoid include : carbonated beverages, raw fruits and vegetables and beans.      Take these medicines the morning of surgery with A SIP OF WATER: none                                 You may not have any metal on your body including hair pins and              piercings  Do not wear jewelry, make-up, lotions, powders or perfumes, deodorant             Do not wear nail polish.  Do not shave  48 hours prior to surgery.                Do not bring valuables to the hospital. [place].  Contacts, dentures or bridgework may not be worn into surgery.  Leave suitcase in the car. After surgery it may be brought to your room.         Special Instructions: coughing and deep breathing exercises, leg exercises               Please read over the following fact sheets you were given: _____________________________________________________________________             NO SOLID FOOD AFTER MIDNIGHT THE NIGHT PRIOR TO SURGERY. NOTHING BY MOUTH EXCEPT CLEAR LIQUIDS UNTIL 3 HOURS PRIOR TO [place] SURGERY. PLEASE FINISH ENSURE DRINK PER SURGEON ORDER 3 HOURS PRIOR TO SCHEDULED SURGERY TIME WHICH NEEDS TO BE COMPLETED AT ____0430am________.   CLEAR LIQUID DIET   Foods Allowed                                                                     Foods Excluded  Coffee and tea, regular and decaf                             liquids that you cannot  Plain Jell-O in any flavor                                             see through such as: Fruit  ices (not with fruit pulp)  milk, soups, orange juice  Iced Popsicles                                    All solid food Carbonated beverages, regular and diet                                    Cranberry, grape and apple juices Sports drinks like Gatorade Lightly seasoned clear broth or consume(fat free) Sugar, honey syrup  Sample Menu Breakfast                                Lunch                                     Supper Cranberry juice                    Beef broth                            Chicken broth Jell-O                                     Grape juice                           Apple juice Coffee or tea                        Jell-O                                      Popsicle                                                Coffee or tea                        Coffee or tea  _____________________________________________________________________  [facility] - Preparing for Surgery Before surgery, you can play an important role.  Because skin is not sterile, your skin needs to be as free of germs as possible.  You can reduce the number of germs on your skin by washing with CHG (chlorahexidine gluconate) soap before surgery.  CHG is an antiseptic cleaner which kills germs and bonds with the skin to continue killing germs even after washing. Please DO NOT use if you have an allergy to CHG or antibacterial soaps.  If your skin becomes reddened/irritated stop using the CHG and inform your nurse when you arrive at Short Stay. Do not shave (including legs and underarms) for at least 48 hours prior to the first CHG shower.  You may shave your face/neck. Please follow these instructions carefully:  1.  Shower with CHG Soap the night before surgery and the  morning of Surgery.  2.  If you choose to wash  your hair, wash your hair first as usual with your  normal  shampoo.  3.  After you shampoo, rinse your hair and body thoroughly to remove the  shampoo.                            4.  Use CHG as you would any other liquid soap.  You can apply chg directly  to the skin and wash                       Gently with a scrungie or clean washcloth.  5.  Apply the CHG Soap to your body ONLY FROM THE NECK DOWN.   Do not use on face/ open                           Wound or open sores. Avoid contact with eyes, ears mouth and genitals (private parts).                       Wash face,  Genitals (private parts) with your normal soap.             6.  Wash thoroughly, paying special attention to the area where your surgery  will be performed.  7.  Thoroughly rinse your body with warm water from the neck down.  8.  DO NOT shower/wash with your normal soap after using and rinsing off  the CHG Soap.                9.  Pat yourself dry with a clean towel.            10.  Wear clean pajamas.            11.  Place clean sheets on your bed the night of your first shower and do not  sleep with pets. Day of Surgery : Do not apply any lotions/deodorants the morning of surgery.  Please wear clean clothes to the hospital/surgery center.  FAILURE TO FOLLOW THESE INSTRUCTIONS MAY RESULT IN THE CANCELLATION OF YOUR SURGERY PATIENT SIGNATURE_________________________________  NURSE SIGNATURE__________________________________  ________________________________________________________________________  WHAT IS A BLOOD TRANSFUSION? Blood Transfusion Information  A transfusion is the replacement of blood or some of its parts. Blood is made up of multiple cells which provide different functions.  Red blood cells carry oxygen and are used for blood loss replacement.  White blood cells fight against infection.  Platelets control bleeding.  Plasma helps clot blood.  Other blood products are available for specialized needs, such as hemophilia or other clotting disorders. BEFORE THE TRANSFUSION  Who gives blood for transfusions?   Healthy volunteers who are fully evaluated to make  sure their blood is safe. This is blood bank blood. Transfusion therapy is the safest it has ever been in the practice of medicine. Before blood is taken from a donor, a complete history is taken to make sure that person has no history of diseases nor engages in risky social behavior (examples are intravenous drug use or sexual activity with multiple partners). The donor's travel history is screened to minimize risk of transmitting infections, such as malaria. The donated blood is tested for signs of infectious diseases, such as HIV and hepatitis. The blood is then tested to be sure it is compatible with you in order to minimize the chance of a transfusion reaction. If you or a  relative donates blood, this is often done in anticipation of surgery and is not appropriate for emergency situations. It takes many days to process the donated blood. RISKS AND COMPLICATIONS Although transfusion therapy is very safe and saves many lives, the main dangers of transfusion include:   Getting an infectious disease.  Developing a transfusion reaction. This is an allergic reaction to something in the blood you were given. Every precaution is taken to prevent this. The decision to have a blood transfusion has been considered carefully by your caregiver before blood is given. Blood is not given unless the benefits outweigh the risks. AFTER THE TRANSFUSION  Right after receiving a blood transfusion, you will usually feel much better and more energetic. This is especially true if your red blood cells have gotten low (anemic). The transfusion raises the level of the red blood cells which carry oxygen, and this usually causes an energy increase.  The nurse administering the transfusion will monitor you carefully for complications. HOME CARE INSTRUCTIONS  No special instructions are needed after a transfusion. You may find your energy is better. Speak with your caregiver about any limitations on activity for underlying  diseases you may have. SEEK MEDICAL CARE IF:   Your condition is not improving after your transfusion.  You develop redness or irritation at the intravenous (IV) site. SEEK IMMEDIATE MEDICAL CARE IF:  Any of the following symptoms occur over the next 12 hours:  Shaking chills.  You have a temperature by mouth above 102 F (38.9 C), not controlled by medicine.  Chest, back, or muscle pain.  People around you feel you are not acting correctly or are confused.  Shortness of breath or difficulty breathing.  Dizziness and fainting.  You get a rash or develop hives.  You have a decrease in urine output.  Your urine turns a dark color or changes to pink, red, or brown. Any of the following symptoms occur over the next 10 days:  You have a temperature by mouth above 102 F (38.9 C), not controlled by medicine.  Shortness of breath.  Weakness after normal activity.  The white part of the eye turns yellow (jaundice).  You have a decrease in the amount of urine or are urinating less often.  Your urine turns a dark color or changes to pink, red, or brown. Document Released: 03/14/2000 Document Revised: 06/09/2011 Document Reviewed: 11/01/2007 ExitCare Patient Information 2014 [place].  _______________________________________________________________________  Incentive Spirometer  An incentive spirometer is a tool that can help keep your lungs clear and active. This tool measures how well you are filling your lungs with each breath. Taking long deep breaths may help reverse or decrease the chance of developing breathing (pulmonary) problems (especially infection) following:  A long period of time when you are unable to move or be active. BEFORE THE PROCEDURE   If the spirometer includes an indicator to show your best effort, your nurse or respiratory therapist will set it to a desired goal.  If possible, sit up straight or lean slightly forward. Try not to  slouch.  Hold the incentive spirometer in an upright position. INSTRUCTIONS FOR USE  1. Sit on the edge of your bed if possible, or sit up as far as you can in bed or on a chair. 2. Hold the incentive spirometer in an upright position. 3. Breathe out normally. 4. Place the mouthpiece in your mouth and seal your lips tightly around it. 5. Breathe in slowly and as deeply as possible, raising  the piston or the ball toward the top of the column. 6. Hold your breath for 3-5 seconds or for as long as possible. Allow the piston or ball to fall to the bottom of the column. 7. Remove the mouthpiece from your mouth and breathe out normally. 8. Rest for a few seconds and repeat Steps 1 through 7 at least 10 times every 1-2 hours when you are awake. Take your time and take a few normal breaths between deep breaths. 9. The spirometer may include an indicator to show your best effort. Use the indicator as a goal to work toward during each repetition. 10. After each set of 10 deep breaths, practice coughing to be sure your lungs are clear. If you have an incision (the cut made at the time of surgery), support your incision when coughing by placing a pillow or rolled up towels firmly against it. Once you are able to get out of bed, walk around indoors and cough well. You may stop using the incentive spirometer when instructed by your caregiver.  RISKS AND COMPLICATIONS  Take your time so you do not get dizzy or light-headed.  If you are in pain, you may need to take or ask for pain medication before doing incentive spirometry. It is harder to take a deep breath if you are having pain. AFTER USE  Rest and breathe slowly and easily.  It can be helpful to keep track of a log of your progress. Your caregiver can provide you with a simple table to help with this. If you are using the spirometer at home, follow these instructions: [place] IF:   You are having difficultly using the spirometer.  You  have trouble using the spirometer as often as instructed.  Your pain medication is not giving enough relief while using the spirometer.  You develop fever of 100.5 F (38.1 C) or higher. SEEK IMMEDIATE MEDICAL CARE IF:   You cough up bloody sputum that had not been present before.  You develop fever of 102 F (38.9 C) or greater.  You develop worsening pain at or near the incision site. MAKE SURE YOU:   Understand these instructions.  Will watch your condition.  Will get help right away if you are not doing well or get worse. Document Released: 07/28/2006 Document Revised: 06/09/2011 Document Reviewed: 09/28/2006 [facility] Patient Information 2014 [place], [place].   ________________________________________________________________________

## 2017-11-19 ENCOUNTER — Other Ambulatory Visit: Payer: Self-pay

## 2017-11-19 ENCOUNTER — Encounter (HOSPITAL_COMMUNITY): Payer: Self-pay

## 2017-11-19 ENCOUNTER — Encounter (HOSPITAL_COMMUNITY)
Admission: RE | Admit: 2017-11-19 | Discharge: 2017-11-19 | Disposition: A | Discharge status: Home / Self Care | Payer: BLUE CROSS/BLUE SHIELD | Source: Ambulatory Visit | Attending: Gynecologic Oncology | Admitting: Gynecologic Oncology

## 2017-11-19 DIAGNOSIS — N83201 Unspecified ovarian cyst, right side: Secondary | ICD-10-CM | POA: Insufficient documentation

## 2017-11-19 DIAGNOSIS — R9431 Abnormal electrocardiogram [ECG] [EKG]: Secondary | ICD-10-CM | POA: Insufficient documentation

## 2017-11-19 DIAGNOSIS — K429 Umbilical hernia without obstruction or gangrene: Secondary | ICD-10-CM | POA: Insufficient documentation

## 2017-11-19 DIAGNOSIS — Z01810 Encounter for preprocedural cardiovascular examination: Secondary | ICD-10-CM | POA: Insufficient documentation

## 2017-11-19 DIAGNOSIS — Z0183 Encounter for blood typing: Secondary | ICD-10-CM | POA: Diagnosis not present

## 2017-11-19 DIAGNOSIS — Z01812 Encounter for preprocedural laboratory examination: Secondary | ICD-10-CM | POA: Diagnosis not present

## 2017-11-19 LAB — ABO/RH: ABO/RH(D): O POS

## 2017-11-19 LAB — COMPREHENSIVE METABOLIC PANEL
ALBUMIN: 3.9 g/dL (ref 3.5–5.0)
ALK PHOS: 61 U/L (ref 38–126)
ALT: 25 U/L (ref 0–44)
ANION GAP: 9 (ref 5–15)
AST: 24 U/L (ref 15–41)
BILIRUBIN TOTAL: 0.3 mg/dL (ref 0.3–1.2)
BUN: 24 mg/dL — AB (ref 6–20)
CO2: 28 mmol/L (ref 22–32)
Calcium: 9.1 mg/dL (ref 8.9–10.3)
Chloride: 101 mmol/L (ref 98–111)
Creatinine, Ser: 1.08 mg/dL — ABNORMAL HIGH (ref 0.44–1.00)
GFR calc Af Amer: >60 mL/min (ref >60)
GFR calc non Af Amer: 58 mL/min — ABNORMAL LOW (ref >60)
GLUCOSE: 89 mg/dL (ref 70–99)
Potassium: 3.8 mmol/L (ref 3.5–5.1)
Sodium: 138 mmol/L (ref 135–145)
Total Protein: 7.6 g/dL (ref 6.5–8.1)

## 2017-11-19 LAB — CBC
HEMATOCRIT: 38.2 % (ref 36.0–46.0)
Hemoglobin: 12 g/dL (ref 12.0–15.0)
MCH: 27.8 pg (ref 26.0–34.0)
MCHC: 31.4 g/dL (ref 30.0–36.0)
MCV: 88.4 fL (ref 78.0–100.0)
Platelets: 259 10*3/uL (ref 150–400)
RBC: 4.32 MIL/uL (ref 3.87–5.11)
RDW: 14.4 % (ref 11.5–15.5)
WBC: 4.3 10*3/uL (ref 4.0–10.5)

## 2017-11-19 LAB — URINALYSIS, ROUTINE W REFLEX MICROSCOPIC
Bilirubin Urine: NEGATIVE
GLUCOSE, UA: NEGATIVE mg/dL
Hgb urine dipstick: NEGATIVE
KETONES UR: NEGATIVE mg/dL
LEUKOCYTES UA: NEGATIVE
Nitrite: NEGATIVE
PROTEIN: NEGATIVE mg/dL
Specific Gravity, Urine: 1.019 (ref 1.005–1.030)
pH: 5 (ref 5.0–8.0)

## 2017-11-19 LAB — PREGNANCY, URINE: PREG TEST UR: NEGATIVE

## 2017-11-19 NOTE — Progress Notes (Signed)
CMp  Don 11/19/2017 routed via epic to Dr Denman George and Zoila Shutter

## 2017-11-19 NOTE — Progress Notes (Signed)
Final EKG done 11/19/17-epic.

## 2017-11-25 NOTE — Progress Notes (Signed)
Final EKg done 11/19/2017 shown to Anesthesia .  NO new orders given.  Anesthesia made aware that nurse attemtped x 8 to get EKG from 2017 from [facility] without success.

## 2017-11-26 ENCOUNTER — Inpatient Hospital Stay (HOSPITAL_COMMUNITY)
Admission: AD | Admit: 2017-11-26 | Discharge: 2017-11-27 | DRG: 743 | Disposition: A | Discharge status: Home / Self Care | Payer: BLUE CROSS/BLUE SHIELD | Attending: Gynecologic Oncology | Admitting: Gynecologic Oncology

## 2017-11-26 ENCOUNTER — Encounter (HOSPITAL_COMMUNITY)
Admission: AD | Disposition: A | Discharge status: Home / Self Care | Payer: Self-pay | Source: Home / Self Care | Attending: Gynecologic Oncology

## 2017-11-26 ENCOUNTER — Ambulatory Visit (HOSPITAL_COMMUNITY): Payer: BLUE CROSS/BLUE SHIELD | Admitting: Anesthesiology

## 2017-11-26 ENCOUNTER — Encounter (HOSPITAL_COMMUNITY): Payer: Self-pay | Admitting: Anesthesiology

## 2017-11-26 ENCOUNTER — Other Ambulatory Visit: Payer: Self-pay

## 2017-11-26 DIAGNOSIS — D259 Leiomyoma of uterus, unspecified: Secondary | ICD-10-CM | POA: Diagnosis present

## 2017-11-26 DIAGNOSIS — L298 Other pruritus: Secondary | ICD-10-CM

## 2017-11-26 DIAGNOSIS — E669 Obesity, unspecified: Secondary | ICD-10-CM | POA: Diagnosis present

## 2017-11-26 DIAGNOSIS — Z91040 Latex allergy status: Secondary | ICD-10-CM

## 2017-11-26 DIAGNOSIS — Z885 Allergy status to narcotic agent status: Secondary | ICD-10-CM | POA: Diagnosis not present

## 2017-11-26 DIAGNOSIS — N951 Menopausal and female climacteric states: Secondary | ICD-10-CM | POA: Diagnosis present

## 2017-11-26 DIAGNOSIS — R1900 Intra-abdominal and pelvic swelling, mass and lump, unspecified site: Secondary | ICD-10-CM | POA: Diagnosis present

## 2017-11-26 DIAGNOSIS — Z6839 Body mass index (BMI) 39.0-39.9, adult: Secondary | ICD-10-CM

## 2017-11-26 DIAGNOSIS — I10 Essential (primary) hypertension: Secondary | ICD-10-CM | POA: Diagnosis present

## 2017-11-26 DIAGNOSIS — K429 Umbilical hernia without obstruction or gangrene: Secondary | ICD-10-CM | POA: Diagnosis present

## 2017-11-26 DIAGNOSIS — D270 Benign neoplasm of right ovary: Secondary | ICD-10-CM | POA: Diagnosis present

## 2017-11-26 DIAGNOSIS — T50905A Adverse effect of unspecified drugs, medicaments and biological substances, initial encounter: Secondary | ICD-10-CM

## 2017-11-26 DIAGNOSIS — N949 Unspecified condition associated with female genital organs and menstrual cycle: Secondary | ICD-10-CM | POA: Diagnosis present

## 2017-11-26 DIAGNOSIS — N83201 Unspecified ovarian cyst, right side: Secondary | ICD-10-CM

## 2017-11-26 DIAGNOSIS — Z79899 Other long term (current) drug therapy: Secondary | ICD-10-CM | POA: Diagnosis not present

## 2017-11-26 DIAGNOSIS — N83209 Unspecified ovarian cyst, unspecified side: Secondary | ICD-10-CM

## 2017-11-26 HISTORY — PX: UNILATERAL SALPINGECTOMY: SHX6160

## 2017-11-26 HISTORY — PX: UMBILICAL HERNIA REPAIR: SHX196

## 2017-11-26 HISTORY — PX: LAPAROTOMY: SHX154

## 2017-11-26 LAB — TYPE AND SCREEN
ABO/RH(D): O POS
Antibody Screen: NEGATIVE

## 2017-11-26 SURGERY — LAPAROTOMY, EXPLORATORY
Anesthesia: General | Laterality: Right

## 2017-11-26 MED ORDER — LIDOCAINE 2% (20 MG/ML) 5 ML SYRINGE
INTRAMUSCULAR | Status: AC
  Filled 2017-11-26: qty 5

## 2017-11-26 MED ORDER — ACETAMINOPHEN 500 MG PO TABS
1000.0000 mg | ORAL_TABLET | ORAL | Status: AC
  Administered 2017-11-26: 1000 mg via ORAL
  Filled 2017-11-26: qty 2

## 2017-11-26 MED ORDER — FENTANYL CITRATE (PF) 100 MCG/2ML IJ SOLN
INTRAMUSCULAR | Status: AC
  Filled 2017-11-26: qty 2

## 2017-11-26 MED ORDER — ROCURONIUM BROMIDE 10 MG/ML (PF) SYRINGE
PREFILLED_SYRINGE | INTRAVENOUS | Status: DC | PRN
  Administered 2017-11-26: 50 mg via INTRAVENOUS
  Administered 2017-11-26: 10 mg via INTRAVENOUS

## 2017-11-26 MED ORDER — LIDOCAINE 2% (20 MG/ML) 5 ML SYRINGE
INTRAMUSCULAR | Status: AC
  Filled 2017-11-26: qty 10

## 2017-11-26 MED ORDER — ENOXAPARIN SODIUM 40 MG/0.4ML ~~LOC~~ SOLN
40.0000 mg | SUBCUTANEOUS | Status: AC
  Administered 2017-11-26: 40 mg via SUBCUTANEOUS
  Filled 2017-11-26: qty 0.4

## 2017-11-26 MED ORDER — DEXAMETHASONE SODIUM PHOSPHATE 10 MG/ML IJ SOLN
INTRAMUSCULAR | Status: AC
  Filled 2017-11-26: qty 1

## 2017-11-26 MED ORDER — ONDANSETRON HCL 4 MG/2ML IJ SOLN
INTRAMUSCULAR | Status: AC
  Filled 2017-11-26: qty 2

## 2017-11-26 MED ORDER — HYDROCHLOROTHIAZIDE 12.5 MG PO CAPS: 12.5000 mg | ORAL_CAPSULE | Freq: Every day | ORAL | Status: DC

## 2017-11-26 MED ORDER — SCOPOLAMINE 1 MG/3DAYS TD PT72
1.0000 | MEDICATED_PATCH | TRANSDERMAL | Status: DC
  Administered 2017-11-26: 1.5 mg via TRANSDERMAL
  Filled 2017-11-26: qty 1

## 2017-11-26 MED ORDER — CELECOXIB 200 MG PO CAPS
400.0000 mg | ORAL_CAPSULE | ORAL | Status: AC
  Administered 2017-11-26: 400 mg via ORAL
  Filled 2017-11-26: qty 2

## 2017-11-26 MED ORDER — SUGAMMADEX SODIUM 200 MG/2ML IV SOLN
INTRAVENOUS | Status: DC | PRN
  Administered 2017-11-26: 250 mg via INTRAVENOUS

## 2017-11-26 MED ORDER — MEPERIDINE HCL 50 MG/ML IJ SOLN: 6.2500 mg | INTRAMUSCULAR | Status: DC | PRN

## 2017-11-26 MED ORDER — HYDROMORPHONE HCL 1 MG/ML IJ SOLN
0.5000 mg | INTRAMUSCULAR | Status: DC | PRN
  Administered 2017-11-26 (×2): 0.5 mg via INTRAVENOUS
  Filled 2017-11-26 (×2): qty 0.5

## 2017-11-26 MED ORDER — MIDAZOLAM HCL 2 MG/2ML IJ SOLN
INTRAMUSCULAR | Status: DC | PRN
  Administered 2017-11-26: 2 mg via INTRAVENOUS

## 2017-11-26 MED ORDER — BUPIVACAINE LIPOSOME 1.3 % IJ SUSP
20.0000 mL | Freq: Once | INTRAMUSCULAR | Status: AC
  Administered 2017-11-26: 20 mL
  Filled 2017-11-26: qty 20

## 2017-11-26 MED ORDER — GABAPENTIN 300 MG PO CAPS
300.0000 mg | ORAL_CAPSULE | ORAL | Status: AC
  Administered 2017-11-26: 300 mg via ORAL
  Filled 2017-11-26: qty 1

## 2017-11-26 MED ORDER — NON FORMULARY: 1.0000 [IU] | Freq: Three times a day (TID) | Status: DC

## 2017-11-26 MED ORDER — DEXAMETHASONE SODIUM PHOSPHATE 4 MG/ML IJ SOLN: 4.0000 mg | INTRAMUSCULAR | Status: DC

## 2017-11-26 MED ORDER — HYDROCHLOROTHIAZIDE 12.5 MG PO CAPS
12.5000 mg | ORAL_CAPSULE | Freq: Every day | ORAL | Status: DC
  Administered 2017-11-26: 12.5 mg via ORAL
  Filled 2017-11-26: qty 1

## 2017-11-26 MED ORDER — ACETAMINOPHEN 500 MG PO TABS
1000.0000 mg | ORAL_TABLET | Freq: Four times a day (QID) | ORAL | Status: DC
  Administered 2017-11-26 – 2017-11-27 (×4): 1000 mg via ORAL
  Filled 2017-11-26 (×4): qty 2

## 2017-11-26 MED ORDER — KCL IN DEXTROSE-NACL 20-5-0.45 MEQ/L-%-% IV SOLN
INTRAVENOUS | Status: DC
  Administered 2017-11-26: 13:00:00 via INTRAVENOUS
  Filled 2017-11-26: qty 1000

## 2017-11-26 MED ORDER — ONDANSETRON HCL 4 MG PO TABS: 4.0000 mg | ORAL_TABLET | Freq: Four times a day (QID) | ORAL | Status: DC | PRN

## 2017-11-26 MED ORDER — ONDANSETRON HCL 4 MG/2ML IJ SOLN: 4.0000 mg | Freq: Once | INTRAMUSCULAR | Status: DC | PRN

## 2017-11-26 MED ORDER — BUPIVACAINE HCL 0.25 % IJ SOLN
INTRAMUSCULAR | Status: DC | PRN
  Administered 2017-11-26: 20 mL

## 2017-11-26 MED ORDER — PROPOFOL 10 MG/ML IV BOLUS
INTRAVENOUS | Status: DC | PRN
  Administered 2017-11-26: 100 mg via INTRAVENOUS

## 2017-11-26 MED ORDER — PROPOFOL 10 MG/ML IV BOLUS
INTRAVENOUS | Status: AC
  Filled 2017-11-26: qty 20

## 2017-11-26 MED ORDER — TRAMADOL HCL 50 MG PO TABS
100.0000 mg | ORAL_TABLET | Freq: Four times a day (QID) | ORAL | Status: DC | PRN
  Administered 2017-11-27: 100 mg via ORAL
  Filled 2017-11-26: qty 2

## 2017-11-26 MED ORDER — IRBESARTAN 150 MG PO TABS
150.0000 mg | ORAL_TABLET | Freq: Every day | ORAL | Status: DC
  Administered 2017-11-26: 150 mg via ORAL
  Filled 2017-11-26: qty 1

## 2017-11-26 MED ORDER — SENNOSIDES-DOCUSATE SODIUM 8.6-50 MG PO TABS
2.0000 | ORAL_TABLET | Freq: Every day | ORAL | Status: DC
  Administered 2017-11-26: 2 via ORAL
  Filled 2017-11-26: qty 2

## 2017-11-26 MED ORDER — SODIUM CHLORIDE 0.9 % IR SOLN
Status: DC | PRN
  Administered 2017-11-26: 2000 mL

## 2017-11-26 MED ORDER — KETAMINE HCL 10 MG/ML IJ SOLN
INTRAMUSCULAR | Status: AC
  Filled 2017-11-26: qty 1

## 2017-11-26 MED ORDER — LIDOCAINE 2% (20 MG/ML) 5 ML SYRINGE
INTRAMUSCULAR | Status: DC | PRN
  Administered 2017-11-26: 100 mg via INTRAVENOUS

## 2017-11-26 MED ORDER — DEXAMETHASONE SODIUM PHOSPHATE 10 MG/ML IJ SOLN
INTRAMUSCULAR | Status: DC | PRN
  Administered 2017-11-26: 10 mg via INTRAVENOUS

## 2017-11-26 MED ORDER — MIDAZOLAM HCL 2 MG/2ML IJ SOLN
INTRAMUSCULAR | Status: AC
  Filled 2017-11-26: qty 2

## 2017-11-26 MED ORDER — ENOXAPARIN SODIUM 40 MG/0.4ML ~~LOC~~ SOLN
40.0000 mg | SUBCUTANEOUS | Status: DC
  Administered 2017-11-27: 40 mg via SUBCUTANEOUS
  Filled 2017-11-26: qty 0.4

## 2017-11-26 MED ORDER — BUPIVACAINE HCL (PF) 0.5 % IJ SOLN
INTRAMUSCULAR | Status: AC
  Filled 2017-11-26: qty 30

## 2017-11-26 MED ORDER — BUPIVACAINE HCL (PF) 0.25 % IJ SOLN
INTRAMUSCULAR | Status: AC
  Filled 2017-11-26: qty 30

## 2017-11-26 MED ORDER — FENTANYL CITRATE (PF) 250 MCG/5ML IJ SOLN
INTRAMUSCULAR | Status: AC
  Filled 2017-11-26: qty 5

## 2017-11-26 MED ORDER — SUGAMMADEX SODIUM 500 MG/5ML IV SOLN
INTRAVENOUS | Status: AC
  Filled 2017-11-26: qty 5

## 2017-11-26 MED ORDER — LACTATED RINGERS IV SOLN
INTRAVENOUS | Status: DC | PRN
  Administered 2017-11-26 (×2): via INTRAVENOUS

## 2017-11-26 MED ORDER — IBUPROFEN 200 MG PO TABS
600.0000 mg | ORAL_TABLET | Freq: Four times a day (QID) | ORAL | Status: DC
  Administered 2017-11-27 (×2): 600 mg via ORAL
  Filled 2017-11-26 (×2): qty 3

## 2017-11-26 MED ORDER — FENTANYL CITRATE (PF) 250 MCG/5ML IJ SOLN
INTRAMUSCULAR | Status: DC | PRN
  Administered 2017-11-26 (×2): 50 ug via INTRAVENOUS
  Administered 2017-11-26: 100 ug via INTRAVENOUS
  Administered 2017-11-26 (×2): 50 ug via INTRAVENOUS

## 2017-11-26 MED ORDER — ONDANSETRON HCL 4 MG/2ML IJ SOLN
INTRAMUSCULAR | Status: DC | PRN
  Administered 2017-11-26: 4 mg via INTRAVENOUS

## 2017-11-26 MED ORDER — CHEWING GUM (ORBIT) SUGAR FREE
1.0000 | CHEWING_GUM | Freq: Three times a day (TID) | ORAL | Status: DC
  Administered 2017-11-26 – 2017-11-27 (×4): 1 via ORAL
  Filled 2017-11-26: qty 1

## 2017-11-26 MED ORDER — HYDROMORPHONE HCL 1 MG/ML IJ SOLN
INTRAMUSCULAR | Status: AC
  Filled 2017-11-26: qty 2

## 2017-11-26 MED ORDER — TELMISARTAN-HCTZ 40-12.5 MG PO TABS: 1.0000 | ORAL_TABLET | Freq: Every day | ORAL | Status: DC

## 2017-11-26 MED ORDER — ROCURONIUM BROMIDE 10 MG/ML (PF) SYRINGE
PREFILLED_SYRINGE | INTRAVENOUS | Status: AC
  Filled 2017-11-26: qty 10

## 2017-11-26 MED ORDER — ENSURE ENLIVE PO LIQD
237.0000 mL | Freq: Two times a day (BID) | ORAL | Status: DC
  Administered 2017-11-27 (×2): 237 mL via ORAL

## 2017-11-26 MED ORDER — PREGABALIN 75 MG PO CAPS
75.0000 mg | ORAL_CAPSULE | Freq: Two times a day (BID) | ORAL | Status: DC
  Administered 2017-11-27: 75 mg via ORAL
  Filled 2017-11-26: qty 1

## 2017-11-26 MED ORDER — SODIUM CHLORIDE 0.9 % IJ SOLN
INTRAMUSCULAR | Status: DC | PRN
  Administered 2017-11-26: 20 mL

## 2017-11-26 MED ORDER — SODIUM CHLORIDE 0.9 % IJ SOLN
INTRAMUSCULAR | Status: AC
  Filled 2017-11-26: qty 20

## 2017-11-26 MED ORDER — ONDANSETRON HCL 4 MG/2ML IJ SOLN
4.0000 mg | Freq: Four times a day (QID) | INTRAMUSCULAR | Status: DC | PRN
  Administered 2017-11-26: 4 mg via INTRAVENOUS
  Filled 2017-11-26: qty 2

## 2017-11-26 MED ORDER — OXYCODONE HCL 5 MG PO TABS
5.0000 mg | ORAL_TABLET | ORAL | Status: DC | PRN
  Administered 2017-11-26 – 2017-11-27 (×2): 5 mg via ORAL
  Filled 2017-11-26 (×2): qty 1

## 2017-11-26 MED ORDER — LIDOCAINE 2% (20 MG/ML) 5 ML SYRINGE
INTRAMUSCULAR | Status: DC | PRN
  Administered 2017-11-26: 1.5 mg/kg/h via INTRAVENOUS

## 2017-11-26 MED ORDER — IRBESARTAN 150 MG PO TABS: 150.0000 mg | ORAL_TABLET | Freq: Every day | ORAL | Status: DC

## 2017-11-26 MED ORDER — HYDROMORPHONE HCL 1 MG/ML IJ SOLN
0.2500 mg | INTRAMUSCULAR | Status: DC | PRN
  Administered 2017-11-26 (×4): 0.5 mg via INTRAVENOUS

## 2017-11-26 MED ORDER — KETAMINE HCL 10 MG/ML IJ SOLN
INTRAMUSCULAR | Status: DC | PRN
  Administered 2017-11-26: 50 mg via INTRAVENOUS

## 2017-11-26 MED ORDER — CEFAZOLIN SODIUM-DEXTROSE 2-4 GM/100ML-% IV SOLN
2.0000 g | INTRAVENOUS | Status: AC
  Administered 2017-11-26: 2 g via INTRAVENOUS
  Filled 2017-11-26: qty 100

## 2017-11-26 SURGICAL SUPPLY — 68 items
ADH SKN CLS APL DERMABOND .7 (GAUZE/BANDAGES/DRESSINGS) ×2
AGENT HMST MTR 8 SURGIFLO (HEMOSTASIS)
ATTRACTOMAT 16X20 MAGNETIC DRP (DRAPES) ×1 IMPLANT
BLADE EXTENDED COATED 6.5IN (ELECTRODE) ×3 IMPLANT
CELLS DAT CNTRL 66122 CELL SVR (MISCELLANEOUS) ×2 IMPLANT
CHLORAPREP W/TINT 26ML (MISCELLANEOUS) ×4 IMPLANT
CLIP VESOCCLUDE LG 6/CT (CLIP) ×3 IMPLANT
CLIP VESOCCLUDE MED 6/CT (CLIP) ×3 IMPLANT
CLIP VESOCCLUDE MED LG 6/CT (CLIP) ×3 IMPLANT
CONT SPEC 4OZ CLIKSEAL STRL BL (MISCELLANEOUS) ×1 IMPLANT
DERMABOND ADVANCED (GAUZE/BANDAGES/DRESSINGS) ×1
DERMABOND ADVANCED .7 DNX12 (GAUZE/BANDAGES/DRESSINGS) IMPLANT
DRAPE INCISE IOBAN 66X45 STRL (DRAPES) IMPLANT
DRAPE WARM FLUID 44X44 (DRAPE) ×3 IMPLANT
DRSG OPSITE POSTOP 4X10 (GAUZE/BANDAGES/DRESSINGS) IMPLANT
DRSG OPSITE POSTOP 4X6 (GAUZE/BANDAGES/DRESSINGS) IMPLANT
DRSG OPSITE POSTOP 4X8 (GAUZE/BANDAGES/DRESSINGS) ×1 IMPLANT
ELECT REM PT RETURN 15FT ADLT (MISCELLANEOUS) ×3 IMPLANT
GAUZE 4X4 16PLY RFD (DISPOSABLE) IMPLANT
GLOVE BIO SURGEON STRL SZ 6 (GLOVE) ×4 IMPLANT
GLOVE BIO SURGEON STRL SZ 6.5 (GLOVE) ×4 IMPLANT
GLOVE BIOGEL PI IND STRL 6 (GLOVE) IMPLANT
GLOVE BIOGEL PI IND STRL 6.5 (GLOVE) IMPLANT
GLOVE BIOGEL PI INDICATOR 6 (GLOVE) ×2
GLOVE BIOGEL PI INDICATOR 6.5 (GLOVE) ×1
GOWN STRL REUS W/ TWL LRG LVL3 (GOWN DISPOSABLE) ×4 IMPLANT
GOWN STRL REUS W/TWL LRG LVL3 (GOWN DISPOSABLE) ×6
HEMOSTAT ARISTA ABSORB 3G PWDR (MISCELLANEOUS) IMPLANT
KIT BASIN OR (CUSTOM PROCEDURE TRAY) ×3 IMPLANT
LIGASURE IMPACT 36 18CM CVD LR (INSTRUMENTS) ×1 IMPLANT
LOOP VESSEL MAXI BLUE (MISCELLANEOUS) IMPLANT
NEEDLE HYPO 22GX1.5 SAFETY (NEEDLE) ×6 IMPLANT
NS IRRIG 1000ML POUR BTL (IV SOLUTION) ×6 IMPLANT
PACK GENERAL/GYN (CUSTOM PROCEDURE TRAY) ×3 IMPLANT
RELOAD PROXIMATE 75MM BLUE (ENDOMECHANICALS) IMPLANT
RELOAD PROXIMATE TA60MM BLUE (ENDOMECHANICALS) IMPLANT
RELOAD STAPLE 60 BLU REG PROX (ENDOMECHANICALS) IMPLANT
RELOAD STAPLE 75 3.8 BLU REG (ENDOMECHANICALS) IMPLANT
RETRACTOR WND ALEXIS 18 MED (MISCELLANEOUS) IMPLANT
RETRACTOR WND ALEXIS 25 LRG (MISCELLANEOUS) IMPLANT
RTRCTR WOUND ALEXIS 18CM MED (MISCELLANEOUS) ×3
RTRCTR WOUND ALEXIS 25CM LRG (MISCELLANEOUS)
SHEET LAVH (DRAPES) ×3 IMPLANT
SPOGE SURGIFLO 8M (HEMOSTASIS)
SPONGE LAP 18X18 RF (DISPOSABLE) IMPLANT
SPONGE SURGIFLO 8M (HEMOSTASIS) IMPLANT
STAPLER GUN LINEAR PROX 60 (STAPLE) IMPLANT
STAPLER PROXIMATE 75MM BLUE (STAPLE) IMPLANT
STAPLER VISISTAT 35W (STAPLE) IMPLANT
SUT MNCRL AB 4-0 PS2 18 (SUTURE) ×7 IMPLANT
SUT PDS AB 1 TP1 96 (SUTURE) ×6 IMPLANT
SUT SILK 3 0 SH CR/8 (SUTURE) IMPLANT
SUT VIC AB 0 CT1 36 (SUTURE) ×8 IMPLANT
SUT VIC AB 2-0 CT1 36 (SUTURE) ×6 IMPLANT
SUT VIC AB 2-0 CT2 27 (SUTURE) ×12 IMPLANT
SUT VIC AB 2-0 SH 27 (SUTURE)
SUT VIC AB 2-0 SH 27X BRD (SUTURE) IMPLANT
SUT VIC AB 3-0 CTX 36 (SUTURE) IMPLANT
SUT VIC AB 3-0 SH 18 (SUTURE) IMPLANT
SUT VIC AB 3-0 SH 27 (SUTURE) ×3
SUT VIC AB 3-0 SH 27X BRD (SUTURE) ×2 IMPLANT
SYR 30ML LL (SYRINGE) ×6 IMPLANT
TOWEL OR 17X26 10 PK STRL BLUE (TOWEL DISPOSABLE) ×3 IMPLANT
TOWEL OR NON WOVEN STRL DISP B (DISPOSABLE) ×3 IMPLANT
TRAY FOL W/BAG SLVR 16FR STRL (SET/KITS/TRAYS/PACK) IMPLANT
TRAY FOLEY MTR SLVR 16FR STAT (SET/KITS/TRAYS/PACK) ×2 IMPLANT
TRAY FOLEY W/BAG SLVR 16FR LF (SET/KITS/TRAYS/PACK) ×3
UNDERPAD 30X30 (UNDERPADS AND DIAPERS) ×3 IMPLANT

## 2017-11-26 NOTE — Anesthesia Procedure Notes (Signed)
Procedure Name: Intubation Date/Time: 11/26/2017 7:41 AM Performed by: Sharlette Dense, CRNA Patient Re-evaluated:Patient Re-evaluated prior to induction Oxygen Delivery Method: Circle system utilized Preoxygenation: Pre-oxygenation with 100% oxygen Induction Type: IV induction Ventilation: Mask ventilation without difficulty and Oral airway inserted - appropriate to patient size Laryngoscope Size: Sabra Heck and 2 Grade View: Grade I Tube type: Oral Tube size: 7.5 mm Number of attempts: 1 Airway Equipment and Method: Stylet Placement Confirmation: ETT inserted through vocal cords under direct vision,  positive ETCO2 and breath sounds checked- equal and bilateral Secured at: 22 cm Tube secured with: Tape Dental Injury: Teeth and Oropharynx as per pre-operative assessment

## 2017-11-26 NOTE — Anesthesia Postprocedure Evaluation (Signed)
Anesthesia Post Note  Patient: Amber Reilly  Procedure(s) Performed: EXPLORATORY LAPAROTOMY (N/A ) RIGHT SALPINGO-OOPHORECTOMY (Right ) HERNIA REPAIR UMBILICAL ADULT (N/A )     Patient location during evaluation: PACU Anesthesia Type: General Level of consciousness: awake and alert Pain management: pain level controlled Vital Signs Assessment: post-procedure vital signs reviewed and stable Respiratory status: spontaneous breathing, nonlabored ventilation, respiratory function stable and patient connected to nasal cannula oxygen Cardiovascular status: blood pressure returned to baseline and stable Postop Assessment: no apparent nausea or vomiting Anesthetic complications: no    Last Vitals:  Vitals:   11/26/17 1030 11/26/17 1045  BP: 113/75 114/81  Pulse: 82 79  Resp: 10 12  Temp:    SpO2: 95% 95%    Last Pain:  Vitals:   11/26/17 1030  TempSrc:   PainSc: 8                  [name] DAVID

## 2017-11-26 NOTE — Transfer of Care (Signed)
Immediate Anesthesia Transfer of Care Note  Patient: Amber Reilly  Procedure(s) Performed: EXPLORATORY LAPAROTOMY (N/A ) RIGHT SALPINGO-OOPHORECTOMY (Right ) HERNIA REPAIR UMBILICAL ADULT (N/A )  Patient Location: PACU  Anesthesia Type:General  Level of Consciousness: awake  Airway & Oxygen Therapy: Patient Spontanous Breathing and Patient connected to face mask oxygen  Post-op Assessment: Report given to RN and Post -op Vital signs reviewed and stable  Post vital signs: Reviewed and stable  Last Vitals:  Vitals Value Taken Time  BP 135/99 11/26/2017  9:30 AM  Temp    Pulse 79 11/26/2017  9:31 AM  Resp 17 11/26/2017  9:31 AM  SpO2 94 % 11/26/2017  9:31 AM  Vitals shown include unvalidated device data.  Last Pain:  Vitals:   11/26/17 0635  TempSrc:   PainSc: 0-No pain         Complications: No apparent anesthesia complications

## 2017-11-26 NOTE — Op Note (Signed)
OPERATIVE NOTE Date: 11/26/17 Preoperative Diagnosis:  Right ovarian cyst, umbilical hernia   Postoperative Diagnosis:same    Procedure(s) Performed: Exploratory laparotomy with right salpingo-oophorectomy, umbilical hernia repair.  Surgeon: Thereasa Solo, MD.  Assistant Surgeon: Lahoma Crocker, M.D. Assistant: (an MD assistant was necessary for tissue manipulation, retraction and positioning due to the complexity of the case and hospital policies).   Specimens: right tube and ovary, washings   Estimated Blood Loss: 50 mL.    Urine [id number]  Complications: None.   Operative Findings: 20cm right ovarian cyst which was extending retroperitoneally behind the cecum. Normal appendix, left tube and ovary. Fibroid uterus.   Procedure:   The patient was seen in the Holding Room. The risks, benefits, complications, treatment options, and expected outcomes were discussed with the patient.  The patient concurred with the proposed plan, giving informed consent.   The patient was  identified as Amber Reilly and the procedure verified as USO, possible staging, umbilical hernia repair. A Time Out was held and the above information confirmed upon entry to the operating room.  After induction of anesthesia, the patient was draped and prepped in the usual sterile manner.  She was prepped and draped in the normal sterile fashion in the dorsal lithotomy position in padded Allen stirrups with good attention paid to support of the lower back and lower extremities. Position was adjusted for appropriate support. A Foley catheter was placed to gravity.   A midline vertical incision was made and carried through the subcutaneous tissue to the fascia. The fascial incision was made and extended superiorally. The rectus muscles were separated. The peritoneum was identified and entered. Peritoneal incision was extended longitudinally.  The abdominal cavity was entered sharply and without incident. There were  omental adhesions within the umbilical hernia sac. These were taken down with the bovie and the hernia sac was released from the umbilical hernia. The fascial incision was extended to incorporate the hernia. A Bookwalter retractor was then placed. A survey of the abdomen and pelvis revealed the above findings, which were significant for a 20cm right ovarian cyst which was retroperitoneal behind the cecum. The appendix was normal, the left tube and ovary were normal. The remainder of the peritoneum was normal.  Washings were obtained from the peritoneal cavity.  An alexis retractor was placed. The bovie was used to dissect the adhesions between the cecum and the anterior surface of the ovarian mass.  A bookwalter retractor was placed. The right retroperitoneum was opened parallel to the cystic mass and this allowed the ureter to be identified. A gallbladder trochar was used to puncture the cyst and evacuate it of fluid to decompress it which facilitated visualization of the extent of the ovary retroperitoneally. Meticulous bovie dissection was employed to free the ovary from its retroperitoneal attachments to the cecum with care to visualize the retroperitoneal ovary at all stages.   A window was made in the medial leaf of the broad ligament above the level of the ureter. The  utero-ovarian ligament and infundibulopelvic ligament were skeletonized and sealed and transected with the ligasure. This liberated the ovary from its attachments. It was sent for frozen section which revealed a benign cystadenoma. Hemostasis was observed at the utero-ovarian and IP ligaments.  The ureter was again identified in the medial leaf of the broad ligament deep to the surgical pedicles. The uterus and contralateral ovary were inspected and noted to be normal.  The peritoneal cavity was irrigated and hemostasis was confirmed at all surgical  sites.  The umbilical hernia was closed with the fascial incision. The fascia was  reapproximated with #1 looped PDS using a total of two sutures. The subcutaneous layer was then irrigated copiously.  Exparel long acting local anesthetic was infiltrated into the subcutaneous tissues. The skin was closed with subcuticular suture. The patient tolerated the procedure well.   Sponge, lap and needle counts were correct x 2.   Donaciano Eva, MD

## 2017-11-26 NOTE — Plan of Care (Signed)
Pt stable though still c/o nausea at times. Rn medicated for needs. Rn paged md concerning pt Bp meds and swelling at this time. Pt reports she takes a daily bp med at bedtime that helps her with not retain fluid. RN will attempt to page md again if needed. Family at bedside educated on no straws as well.

## 2017-11-26 NOTE — Interval H&P Note (Signed)
History and Physical Interval Note:  11/26/2017 7:04 AM  Amber Reilly  has presented today for surgery, with the diagnosis of RIGHT OVARIAN CYST  The various methods of treatment have been discussed with the patient and family. After consideration of risks, benefits and other options for treatment, the patient has consented to  Procedure(s): EXPLORATORY LAPAROTOMY (N/A) UNILATERAL SALPINGECTOMY (Bilateral) HERNIA REPAIR UMBILICAL ADULT (N/A) as a surgical intervention .  The patient's history has been reviewed, patient examined, no change in status, stable for surgery.   I have reviewed the patient's chart and labs. Tumor markers were normal.   Questions were answered to the patient's satisfaction.     Thereasa Solo

## 2017-11-26 NOTE — Anesthesia Preprocedure Evaluation (Signed)
Anesthesia Evaluation  Patient identified by MRN, date of birth, ID band Patient awake    Reviewed: Allergy & Precautions, NPO status , Patient's Chart, lab work & pertinent test results  Airway Mallampati: I  TM Distance: >3 FB Neck ROM: Full    Dental   Pulmonary    Pulmonary exam normal        Cardiovascular hypertension, Pt. on medications Normal cardiovascular exam     Neuro/Psych Depression    GI/Hepatic   Endo/Other    Renal/GU      Musculoskeletal   Abdominal   Peds  Hematology   Anesthesia Other Findings   Reproductive/Obstetrics                             Anesthesia Physical Anesthesia Plan  ASA: II  Anesthesia Plan: General   Post-op Pain Management:    Induction: Intravenous  PONV Risk Score and Plan: 3 and Ondansetron, Midazolam and Treatment may vary due to age or medical condition  Airway Management Planned: Oral ETT  Additional Equipment:   Intra-op Plan:   Post-operative Plan: Extubation in OR  Informed Consent: I have reviewed the patients History and Physical, chart, labs and discussed the procedure including the risks, benefits and alternatives for the proposed anesthesia with the patient or authorized representative who has indicated his/her understanding and acceptance.     Plan Discussed with: CRNA and Surgeon  Anesthesia Plan Comments:         Anesthesia Quick Evaluation

## 2017-11-26 NOTE — Discharge Instructions (Signed)
11/26/2017  Return to work: 6 weeks  Activity: 1. Be up and out of the bed during the day.  Take a nap if needed.  You may walk up steps but be careful and use the hand rail.  Stair climbing will tire you more than you think, you may need to stop part way and rest.   2. No lifting or straining for 6 weeks.  3. No driving for 2 weeks.  Do Not drive if you are taking narcotic pain medicine.  4. Shower daily.  Use soap and water on your incision and pat dry; don't rub.   5. No sexual activity and nothing in the vagina for 2 weeks.  Medications:  - Take ibuprofen and tylenol first line for pain control. Take these regularly (every 6 hours) to decrease the build up of pain.  - If necessary, for severe pain not relieved by ibuprofen, take percocet.  - While taking percocet you should take sennakot every night to reduce the likelihood of constipation. If this causes diarrhea, stop its use.  Diet: 1. Low sodium Heart Healthy Diet is recommended.  2. It is safe to use a laxative if you have difficulty moving your bowels.   Wound Care: 1. Keep clean and dry.  Shower daily.  Reasons to call the Doctor:   Fever - Oral temperature greater than 100.4 degrees Fahrenheit  Foul-smelling vaginal discharge  Difficulty urinating  Nausea and vomiting  Increased pain at the site of the incision that is unrelieved with pain medicine.  Difficulty breathing with or without chest pain  New calf pain especially if only on one side  Sudden, continuing increased vaginal bleeding with or without clots.   Follow-up: 1. See [name] in 4 weeks.  Contacts: For questions or concerns you should contact:  Dr. [name] at [phone] After hours and on week-ends call [phone] and ask to speak to the physician on call for Gynecologic Oncology

## 2017-11-27 ENCOUNTER — Encounter (HOSPITAL_COMMUNITY): Payer: Self-pay | Admitting: Gynecologic Oncology

## 2017-11-27 LAB — CBC
HCT: 32.4 % — ABNORMAL LOW (ref 36.0–46.0)
HEMOGLOBIN: 10.2 g/dL — AB (ref 12.0–15.0)
MCH: 27.6 pg (ref 26.0–34.0)
MCHC: 31.5 g/dL (ref 30.0–36.0)
MCV: 87.8 fL (ref 78.0–100.0)
Platelets: 230 10*3/uL (ref 150–400)
RBC: 3.69 MIL/uL — AB (ref 3.87–5.11)
RDW: 14.4 % (ref 11.5–15.5)
WBC: 7.7 10*3/uL (ref 4.0–10.5)

## 2017-11-27 LAB — BASIC METABOLIC PANEL
ANION GAP: 6 (ref 5–15)
BUN: 21 mg/dL — ABNORMAL HIGH (ref 6–20)
CHLORIDE: 104 mmol/L (ref 98–111)
CO2: 28 mmol/L (ref 22–32)
Calcium: 8.4 mg/dL — ABNORMAL LOW (ref 8.9–10.3)
Creatinine, Ser: 0.98 mg/dL (ref 0.44–1.00)
GFR calc Af Amer: >60 mL/min (ref >60)
GFR calc non Af Amer: >60 mL/min (ref >60)
GLUCOSE: 138 mg/dL — AB (ref 70–99)
POTASSIUM: 4.4 mmol/L (ref 3.5–5.1)
Sodium: 138 mmol/L (ref 135–145)

## 2017-11-27 MED ORDER — OXYCODONE-ACETAMINOPHEN 5-325 MG PO TABS: 1.0000 | ORAL_TABLET | ORAL | 0 refills | Status: DC | PRN

## 2017-11-27 MED ORDER — DIPHENHYDRAMINE HCL 25 MG PO CAPS
25.0000 mg | ORAL_CAPSULE | Freq: Four times a day (QID) | ORAL | Status: DC | PRN
  Administered 2017-11-27: 25 mg via ORAL
  Filled 2017-11-27: qty 1

## 2017-11-27 NOTE — Plan of Care (Signed)
Pt alert and oriented, OOB ambulating in hall and to bathroom. Voiding, pain well controlled.  RN will monitor.

## 2017-11-27 NOTE — Progress Notes (Addendum)
1 Day Post-Op Procedure(s) (LRB): EXPLORATORY LAPAROTOMY (N/A) RIGHT SALPINGO-OOPHORECTOMY (Right) HERNIA REPAIR UMBILICAL ADULT (N/A)  Subjective: Patient reports doing well this am.  Had intermittent right chest discomfort yesterday that has resolved this am.  She is getting ready to eat a solid diet. No nausea or emesis reported.  Stating she has passed some flatus.  Ambulating without difficulty.  She has voided since foley removal.  Pain controlled with PRN oral medications. States she normally takes her BP medication at nighttime and she had to have the RN last pm contact MD on call to get her BP medication due to generalize edema in all extrem after surgery. Stating it is better this am. Asking if she can do home today.  Family member at the bedside.  No concerns voiced.     Objective: Vital signs in last 24 hours: Temp:  [97.9 F (36.6 C)-98.6 F (37 C)] 98.1 F (36.7 C) (08/30 0554) Pulse Rate:  [58-79] 58 (08/30 0554) Resp:  [12-18] 16 (08/30 0554) BP: (98-142)/(59-97) 132/76 (08/30 0554) SpO2:  [95 %-100 %] 98 % (08/30 0554) Last BM Date: 11/24/17  Intake/Output from previous day: 08/29 0701 - 08/30 0700 In: 3568.1 [P.O.:1380; I.V.:2088.1; IV Piggyback:100] Out: 7622 [QJFHL:4562; Blood:50]  Physical Examination: General: alert, cooperative and no distress Resp: clear to auscultation bilaterally Cardio: regular rate and rhythm, S1, S2 normal, no murmur, click, rub or gallop GI: incision: midline op site dressing dry and intact, no active drainage and abdomen obese, active bowel sounds Extremities: mild generalized edema in bil lower extrem (not new finding for pt), non-pitting  Labs: WBC/Hgb/Hct/Plts:  7.7/10.2/32.4/230 (08/30 0449) BUN/Cr/glu/ALT/AST/amyl/lip:  21/0.98/--/--/--/--/-- (08/30 0449)  Assessment: 52 y.o. s/p Procedure(s): EXPLORATORY LAPAROTOMY RIGHT SALPINGO-OOPHORECTOMY HERNIA REPAIR UMBILICAL ADULT: stable Pain:  Pain is well-controlled on PRN  medications.  Heme: Hgb 10.2 and Hct 32.4 this am. Appropriate this am.  CV: Mild bradycardia but asymptomatic.  BP stable.  Avapro and HCTZ ordered.  GI:  Tolerating po: Yes     GU: Voiding since foley removal. Adequate output reported.    FEN: Appropriate this am.  Prophylaxis: SCDs and lovenox.  Plan: Diet as tolerated Encourage ambulation, IS use, deep breathing, and coughing Plan for possible discharge later today if diet tolerated, pain controlled, ambulating, voiding.   LOS: 1 day    Dorothyann Gibbs 11/27/2017, 10:41 AM    Patient seen and examined in afternoon. Agree with above. Patient states she is ready for home. States pain controlled and tolerating diet. I gave her ileus signs. Plan for discharge.

## 2017-11-27 NOTE — Progress Notes (Signed)
Discharge paperwork discussed with pt and husband at the bedside.  They demonstrated understanding and pt was escorted to to main lobby in stable condition.

## 2017-11-27 NOTE — Discharge Summary (Signed)
Physician Discharge Summary  Patient ID: Amber Reilly MRN: [id number] DOB/AGE: 1965/06/01 52 y.o.  Admit date: 11/26/2017 Discharge date: 11/27/2017  Admission Diagnoses: Ovarian cyst  Discharge Diagnoses:  Principal Problem:   Ovarian cyst Active Problems:   Obesity (BMI 35.0-39.9 without comorbidity)   Umbilical hernia without obstruction and without gangrene   Pelvic mass in female   Discharged Condition: stable  Hospital Course:  1/ patient was admitted on 11/26/17 for resection of an pelvic mass 2/ surgery was uncomplicated  3/ on postoperative day 1 the patient was meeting discharge criteria: tolerating PO, voiding urine, ambulating, pain well controlled on oral medications. 4/ new medications on discharge include Percocet.    Consults: None  Significant Diagnostic Studies: none  Treatments: surgery:   Discharge Exam: Blood pressure 118/77, pulse 78, temperature 98.7 F (37.1 C), temperature source Oral, resp. rate 17, height 5\' 4"  (1.626 m), weight 228 lb (103.4 kg), SpO2 97 %. General appearance: alert, cooperative and no distress Resp: clear to auscultation bilaterally Cardio: regular rate and rhythm GI: soft, non-tender; bowel sounds normal; no masses,  no organomegaly Extremities: extremities normal, atraumatic, no cyanosis or edema  Disposition: Discharge disposition: 01-Home or Self Care       Discharge Instructions    Call MD for:  persistant nausea and vomiting   Complete by:  As directed    Call MD for:  redness, tenderness, or signs of infection (pain, swelling, redness, odor or green/yellow discharge around incision site)   Complete by:  As directed    Call MD for:  temperature >100.4   Complete by:  As directed    Diet - low sodium heart healthy   Complete by:  As directed    Discharge instructions   Complete by:  As directed    11/27/2017  Activity: 1. Be up and out of the bed during the day.  Take a nap if needed.  You may walk up  steps but be careful and use the hand rail.  Stair climbing will tire you more than you think, you may need to stop part way and rest.   2. No lifting or straining for 6 weeks.  3. No driving for 1 week.  Do Not drive if you are taking narcotic pain medicine.  4. Shower daily.  Use soap and water on your incision and pat dry; don't rub.   5. No sexual activity and nothing in the vagina for 4 weeks.  Medications:  - Take ibuprofen and tylenol first line for pain control. Take these regularly (every 6 hours) to decrease the build up of pain.  - If necessary, for severe pain not relieved by ibuprofen, take percocet.  - While taking percocet you should take sennakot every night to reduce the likelihood of constipation. If this causes diarrhea, stop its use.  Diet: 1. Low sodium Heart Healthy Diet is recommended.  2. It is safe to use a laxative if you have difficulty moving your bowels.   Wound Care: 1. Keep clean and dry.  Shower daily.  Reasons to call the Doctor:  Fever - Oral temperature greater than 100.4 degrees Fahrenheit Foul-smelling vaginal discharge Difficulty urinating Nausea and vomiting Increased pain at the site of the incision that is unrelieved with pain medicine. Difficulty breathing with or without chest pain New calf pain especially if only on one side Sudden, continuing increased vaginal bleeding with or without clots.   Follow-up: 1. See [name] in 3 weeks.  Contacts: For questions  or concerns you should contact:  Dr. [name] at [phone] After hours and on week-ends call [phone] and ask to speak to the physician on call for Gynecologic Oncology   Increase activity slowly   Complete by:  As directed    Lifting restrictions   Complete by:  As directed    Nothing more than 5 pounds      Follow-up Information    [name], MD In 4 weeks.   Specialty:  Gynecologic Oncology Contact information: [address] [place]  [id number] [phone]           Signed: Isabel Reilly 11/27/2017, 4:45 PM

## 2017-12-01 ENCOUNTER — Telehealth: Payer: Self-pay | Admitting: *Deleted

## 2017-12-01 NOTE — Telephone Encounter (Signed)
Patient called stating that she was having more pain in her vaginal area than she anticipated having.  Patient rates her pain at a level 7 on a 10 point number scale.  Describes pain as shooting from her vagina.  Patient also wants to know if she is able to take her bandages off.  Patient states that she is moving around good, but slow, she is now having regular bowel movements, at first she wasn't having any for a few days but yesterday and today she is having normal bowel movements.  Patient states that she is taking oxycodone every four hours to help with the pain.  I told patient that I would talk with our nurse practitioner and give her a call back.  Patient was very appreciative and verbalized understanding.

## 2017-12-01 NOTE — Telephone Encounter (Signed)
I called patient back concerning her post-op pain concerns.  Per Amber John, NP rotate using Tylenol and Ibuprofen with the oxycodone to help with the pain, it is ok to take off the bandages, use ice or heat on your stomach to also help with discomfort, and also wear some tug fitting pants,for support.  I told patient to please let our office know if the pain worsens, and also give our office a call to let us know how she is doing.  Patient was very appreciative and verbalized understanding.

## 2017-12-03 ENCOUNTER — Telehealth: Payer: Self-pay

## 2017-12-03 ENCOUNTER — Inpatient Hospital Stay: Payer: BLUE CROSS/BLUE SHIELD | Attending: Gynecologic Oncology | Admitting: Gynecologic Oncology

## 2017-12-03 ENCOUNTER — Encounter: Payer: Self-pay | Admitting: Gynecologic Oncology

## 2017-12-03 ENCOUNTER — Inpatient Hospital Stay: Payer: BLUE CROSS/BLUE SHIELD

## 2017-12-03 VITALS — BP 117/78 | HR 82 | Temp 98.6°F | Resp 18 | Ht 64.0 in | Wt 224.2 lb

## 2017-12-03 DIAGNOSIS — T8141XA Infection following a procedure, superficial incisional surgical site, initial encounter: Secondary | ICD-10-CM | POA: Diagnosis not present

## 2017-12-03 DIAGNOSIS — Z90721 Acquired absence of ovaries, unilateral: Secondary | ICD-10-CM | POA: Diagnosis not present

## 2017-12-03 DIAGNOSIS — N83201 Unspecified ovarian cyst, right side: Secondary | ICD-10-CM

## 2017-12-03 DIAGNOSIS — L0390 Cellulitis, unspecified: Secondary | ICD-10-CM

## 2017-12-03 LAB — CMP (CANCER CENTER ONLY)
ALBUMIN: 3.7 g/dL (ref 3.5–5.0)
ALT: 33 U/L (ref 0–44)
AST: 20 U/L (ref 15–41)
Alkaline Phosphatase: 77 U/L (ref 38–126)
Anion gap: 10 (ref 5–15)
BILIRUBIN TOTAL: 0.4 mg/dL (ref 0.3–1.2)
BUN: 14 mg/dL (ref 6–20)
CALCIUM: 9.7 mg/dL (ref 8.9–10.3)
CO2: 29 mmol/L (ref 22–32)
Chloride: 101 mmol/L (ref 98–111)
Creatinine: 1.11 mg/dL — ABNORMAL HIGH (ref 0.44–1.00)
GFR, EST NON AFRICAN AMERICAN: 56 mL/min — AB (ref >60)
GFR, Est AFR Am: >60 mL/min (ref >60)
GLUCOSE: 92 mg/dL (ref 70–99)
Potassium: 4.1 mmol/L (ref 3.5–5.1)
Sodium: 140 mmol/L (ref 135–145)
TOTAL PROTEIN: 7.9 g/dL (ref 6.5–8.1)

## 2017-12-03 LAB — CBC WITH DIFFERENTIAL (CANCER CENTER ONLY)
BASOS ABS: 0 10*3/uL (ref 0.0–0.1)
BASOS PCT: 1 %
EOS ABS: 0.2 10*3/uL (ref 0.0–0.5)
EOS PCT: 6 %
HCT: 37.2 % (ref 34.8–46.6)
Hemoglobin: 11.9 g/dL (ref 11.6–15.9)
Lymphocytes Relative: 25 %
Lymphs Abs: 1 10*3/uL (ref 0.9–3.3)
MCH: 27.8 pg (ref 25.1–34.0)
MCHC: 31.9 g/dL (ref 31.5–36.0)
MCV: 87.2 fL (ref 79.5–101.0)
Monocytes Absolute: 0.4 10*3/uL (ref 0.1–0.9)
Monocytes Relative: 9 %
Neutro Abs: 2.4 10*3/uL (ref 1.5–6.5)
Neutrophils Relative %: 59 %
PLATELETS: 259 10*3/uL (ref 145–400)
RBC: 4.27 MIL/uL (ref 3.70–5.45)
RDW: 14.6 % — AB (ref 11.2–14.5)
WBC Count: 4 10*3/uL (ref 3.9–10.3)

## 2017-12-03 MED ORDER — CEPHALEXIN 500 MG PO CAPS: 500.0000 mg | ORAL_CAPSULE | Freq: Four times a day (QID) | ORAL | 0 refills | Status: DC

## 2017-12-03 NOTE — Telephone Encounter (Signed)
Outgoing call to patient per [name] NP- recent bloodwork results for "WBC is normal so most likely infection is early and your creatinine is elevated which may mean dehydration and could cause dizziness,  need to drink more water."  Pt voiced understanding.  Encouraged drinking plenty of fluids especially water. Pt said she is at her pharmacy getting antibiotic as ordered.  Told her per Lenna Sciara NP to keep [name] updated on how she is feeling or any changes.  Pt voiced understanding. No other needs per pt at this time.
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
## 2017-12-03 NOTE — Telephone Encounter (Addendum)
Notified [name] NP regarding pt's call ( my previous note) and per [facility] NP to ask pt more details regarding knot, is it near incision, any redness, or drainage?  Outgoing call to patient, patient reports she just got out of shower and the knot area on abd is very red.  Reports she has noticed the knot with numbness since after surgery but the redness is new.  Also reports it is near the incision and reports the incision has an odor, said "it stinks and it's itchy."  No fever reported and denies drainage. Pt also said she felt woosy and was thinking about going to the ER to have area checked out.  Notified [name] NP and per her, have pt come in for appt to see her now.  Pt voiced understanding and reports she has someone to bring her. No other needs per pt at this time.

## 2017-12-03 NOTE — Patient Instructions (Signed)
We will check lab work today and call you with the results.  Plan to begin taking Keflex four times daily for ten days.  Call if the redness around your incision worsens or your symptoms do not improve.  You can also call if you develop fever, chills, signs of a yeast infection, or any other concerning symptoms.  Cephalexin tablets or capsules What is this medicine? CEPHALEXIN (sef a LEX in) is a cephalosporin antibiotic. It is used to treat certain kinds of bacterial infections It will not work for colds, flu, or other viral infections. This medicine may be used for other purposes; ask your health care provider or pharmacist if you have questions. COMMON BRAND NAME(S): Biocef, Daxbia, Keflex, Keftab What should I tell my health care provider before I take this medicine? They need to know if you have any of these conditions: -kidney disease -stomach or intestine problems, especially colitis -an unusual or allergic reaction to cephalexin, other cephalosporins, penicillins, other antibiotics, medicines, foods, dyes or preservatives -pregnant or trying to get pregnant -breast-feeding How should I use this medicine? Take this medicine by mouth with a full glass of water. Follow the directions on the prescription label. This medicine can be taken with or without food. Take your medicine at regular intervals. Do not take your medicine more often than directed. Take all of your medicine as directed even if you think you are better. Do not skip doses or stop your medicine early. Talk to your pediatrician regarding the use of this medicine in children. While this drug may be prescribed for selected conditions, precautions do apply. Overdosage: If you think you have taken too much of this medicine contact a poison control center or emergency room at once. NOTE: This medicine is only for you. Do not share this medicine with others. What if I miss a dose? If you miss a dose, take it as soon as you can. If it  is almost time for your next dose, take only that dose. Do not take double or extra doses. There should be at least 4 to 6 hours between doses. What may interact with this medicine? -probenecid -some other antibiotics This list may not describe all possible interactions. Give your health care provider a list of all the medicines, herbs, non-prescription drugs, or dietary supplements you use. Also tell them if you smoke, drink alcohol, or use illegal drugs. Some items may interact with your medicine. What should I watch for while using this medicine? Tell your doctor or health care professional if your symptoms do not begin to improve in a few days. Do not treat diarrhea with over the counter products. Contact your doctor if you have diarrhea that lasts more than 2 days or if it is severe and watery. If you have diabetes, you may get a false-positive result for sugar in your urine. Check with your doctor or health care professional. What side effects may I notice from receiving this medicine? Side effects that you should report to your doctor or health care professional as soon as possible: -allergic reactions like skin rash, itching or hives, swelling of the face, lips, or tongue -breathing problems -pain or trouble passing urine -redness, blistering, peeling or loosening of the skin, including inside the mouth -severe or watery diarrhea -unusually weak or tired -yellowing of the eyes, skin Side effects that usually do not require medical attention (report to your doctor or health care professional if they continue or are bothersome): -gas or heartburn -genital or anal  irritation -headache -joint or muscle pain -nausea, vomiting This list may not describe all possible side effects. Call your doctor for medical advice about side effects. You may report side effects to FDA at 1-800-FDA-1088. Where should I keep my medicine? Keep out of the reach of children. Store at room temperature between  59 and 86 degrees F (15 and 30 degrees C). Throw away any unused medicine after the expiration date. NOTE: This sheet is a summary. It may not cover all possible information. If you have questions about this medicine, talk to your doctor, pharmacist, or health care provider.  2018 Elsevier/Gold Standard (2007-06-21 17:09:13)

## 2017-12-03 NOTE — Telephone Encounter (Signed)
Incoming call from patient regarding she had surgery on 8-29 with Dr Denman George, pt reports she has a small knot on her right mid to upper abd that is numb.  I let her know I would notify [name] NP and call her back with further instructions.  Pt also reported she had called previously about pain and was told to keep in touch regarding that but reports pain is no longer issue now, has resolved. No other needs per pt at this time.

## 2017-12-23 ENCOUNTER — Telehealth: Payer: Self-pay

## 2017-12-23 NOTE — Telephone Encounter (Signed)
Outgoing call to patient per [name] NP as a follow up call. No answer, no option to leave a VM at this time.

## 2017-12-23 NOTE — Telephone Encounter (Signed)
Outgoing call per [name] NP - regarding" how is she?"  - pt reports she is doing well. Antibiotics worked and has had a good few weeks, reports rare use of pain med for soreness/pain last week and reports there is one small area of incision that hasn't healed completely but denies any drainage or concerns.  Reminded pt of upcoming appt on 9-27 - voiced understanding and no other issues per pt at this time.  Updated Melissa NP on my call with pt.

## 2017-12-25 ENCOUNTER — Inpatient Hospital Stay (HOSPITAL_BASED_OUTPATIENT_CLINIC_OR_DEPARTMENT_OTHER): Payer: BLUE CROSS/BLUE SHIELD | Admitting: Gynecologic Oncology

## 2017-12-25 ENCOUNTER — Encounter: Payer: Self-pay | Admitting: Gynecologic Oncology

## 2017-12-25 VITALS — BP 127/82 | HR 78 | Temp 99.0°F | Ht 64.0 in | Wt 228.2 lb

## 2017-12-25 DIAGNOSIS — R1900 Intra-abdominal and pelvic swelling, mass and lump, unspecified site: Secondary | ICD-10-CM

## 2017-12-25 DIAGNOSIS — N83201 Unspecified ovarian cyst, right side: Secondary | ICD-10-CM

## 2017-12-25 DIAGNOSIS — Z90721 Acquired absence of ovaries, unilateral: Secondary | ICD-10-CM
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
## 2017-12-25 NOTE — Patient Instructions (Signed)
You are cleared for all activity and to return to work.  Contact Dr Denman George with questions about your surgery at [phone].   OBGYN recommendations Dr Bobbye Charleston: [phone] Dr Paula Compton: [phone]

## 2018-05-28 ENCOUNTER — Other Ambulatory Visit: Payer: Self-pay | Admitting: Internal Medicine

## 2018-08-06 ENCOUNTER — Encounter: Payer: Self-pay | Admitting: Endocrinology

## 2018-08-09 ENCOUNTER — Other Ambulatory Visit: Payer: Self-pay

## 2018-08-09 ENCOUNTER — Ambulatory Visit (INDEPENDENT_AMBULATORY_CARE_PROVIDER_SITE_OTHER): Payer: No Typology Code available for payment source | Admitting: Endocrinology

## 2018-08-09 DIAGNOSIS — E042 Nontoxic multinodular goiter: Secondary | ICD-10-CM

## 2018-08-09 NOTE — Progress Notes (Signed)
Subjective:    Patient ID: Amber Reilly, female    DOB: 1965-05-24, 53 y.o.   MRN: [id number]  HPI  telehealth visit today via doxy video visit.  Alternatives to telehealth are presented to this patient, and the patient agrees to the telehealth visit. Pt is advised of the cost of the visit, and agrees to this, also.   Patient is at home, and I am at the office.   Persons attending the telehealth visit: the patient and I Pt returns for f/u of multinodular goiter (dx'ed 2006; bx in 2013 showed benign follicular nodule; f/u US in 2018 was unchanged; nuc med scan showed multinodular goiter, but no hot or cold nodule; TSH has been borderline low).  She does not notice the goiter.   Past Medical History:  Diagnosis Date  . Hypertension     Past Surgical History:  Procedure Laterality Date  . CESAREAN SECTION    . ENDOMETRIAL ABLATION    . FOOT SURGERY Left   . KNEE SURGERY Left   . LAPAROTOMY N/A 11/26/2017   Procedure: EXPLORATORY LAPAROTOMY;  Surgeon: [name], MD;  Location: [facility];  Service: Gynecology;  Laterality: N/A;  . UMBILICAL HERNIA REPAIR N/A 11/26/2017   Procedure: HERNIA REPAIR UMBILICAL ADULT;  Surgeon: [name], MD;  Location: [facility];  Service: Gynecology;  Laterality: N/A;  . UNILATERAL SALPINGECTOMY Right 11/26/2017   Procedure: RIGHT SALPINGO-OOPHORECTOMY;  Surgeon: [name], MD;  Location: [facility];  Service: Gynecology;  Laterality: Right;    Social History   Socioeconomic History  . Marital status: Married    Spouse name: Not on file  . Number of children: Not on file  . Years of education: Not on file  . Highest education level: Not on file  Occupational History  . Not on file  Social Needs  . Financial resource strain: Not on file  . Food insecurity:    Worry: Not on file    Inability: Not on file  . Transportation needs:    Medical: Not on file    Non-medical: Not on file  Tobacco Use  . Smoking status: Never Smoker  . Smokeless tobacco:  Never Used  Substance and Sexual Activity  . Alcohol use: No  . Drug use: No  . Sexual activity: Yes  Lifestyle  . Physical activity:    Days per week: Not on file    Minutes per session: Not on file  . Stress: Not on file  Relationships  . Social connections:    Talks on phone: Not on file    Gets together: Not on file    Attends religious service: Not on file    Active member of club or organization: Not on file    Attends meetings of clubs or organizations: Not on file    Relationship status: Not on file  . Intimate partner violence:    Fear of current or ex partner: Not on file    Emotionally abused: Not on file    Physically abused: Not on file    Forced sexual activity: Not on file  Other Topics Concern  . Not on file  Social History Narrative  . Not on file    Current Outpatient Medications on File Prior to Visit  Medication Sig Dispense Refill  . Cholecalciferol (VITAMIN D3) 2000 UNITS capsule Take 1 capsule (2,000 Units total) by mouth daily. 100 capsule 3  . oxyCODONE-acetaminophen (PERCOCET/ROXICET) 5-325 MG tablet Take 1 tablet by mouth every 4 (four)  hours as needed for severe pain. 30 tablet 0  . telmisartan-hydrochlorothiazide (MICARDIS HCT) 40-12.5 MG tablet Take 1 tablet by mouth daily. Patient needs office visit before refills will be given 90 tablet 0  . rizatriptan (MAXALT) 10 MG tablet Take 1 tablet (10 mg total) by mouth once as needed for migraine. May repeat in 2 hours if needed 12 tablet 3   No current facility-administered medications on file prior to visit.     Allergies  Allergen Reactions  . Tramadol Other (See Comments)    Bad headache  . Latex Swelling    Selling at the site of contact only  . Morphine And Related     Patient does not like the way it makes her feel     Family History  Problem Relation Age of Onset  . Heart attack Father   . Cancer Other   . Goiter Paternal Aunt   . Colon cancer Neg Hx   . Esophageal cancer Neg Hx    . Stomach cancer Neg Hx     Review of Systems Denies neck pain.      Objective:   Physical Exam      Assessment & Plan:  MNG: due for recheck Low TSH: all she needs is to follow this, as it is mild  Patient Instructions  Let's recheck the ultrasound.  you will receive a phone call, about a day and time for an appointment. Please schedule your annual physical with [name].  When they do blood tests, they will include the thyroid. Please come back for a follow-up appointment in 1 year.

## 2018-08-09 NOTE — Patient Instructions (Addendum)
Let's recheck the ultrasound.  you will receive a phone call, about a day and time for an appointment. Please schedule your annual physical with [name].  When they do blood tests, they will include the thyroid. Please come back for a follow-up appointment in 1 year.

## 2018-08-19 ENCOUNTER — Other Ambulatory Visit: Payer: Self-pay | Admitting: Internal Medicine

## 2018-08-24 ENCOUNTER — Other Ambulatory Visit: Payer: Self-pay

## 2019-01-27 ENCOUNTER — Telehealth: Payer: Self-pay

## 2019-01-27 MED ORDER — SODIUM CHLORIDE 0.9 % IV SOLN
10.00 | INTRAVENOUS | Status: DC
Start: ? — End: 2019-01-27

## 2019-01-27 MED ORDER — GENERIC EXTERNAL MEDICATION
Status: DC
Start: ? — End: 2019-01-27

## 2019-01-27 NOTE — Telephone Encounter (Signed)
Told Ms Sanks that Dr. Denman George made recommendations of 2 OBGYN as noted below at visit 12-25-17. OBGYN recommendations Dr Bobbye Charleston: [phone] Dr Paula Compton: [phone] Pt stated that no one called her.  Told Ms Bacigalupi that Dr. Denman George gave her the office numbers to the physicians.  She was to call and set up appointment. Pt can have PCP schedule mammograms. She needs to see a gyn to have her recent vaginal bleeding evaluated. Pt verbalized understanding.

## 2019-01-31 ENCOUNTER — Encounter: Payer: Self-pay | Admitting: Internal Medicine

## 2019-01-31 ENCOUNTER — Ambulatory Visit (INDEPENDENT_AMBULATORY_CARE_PROVIDER_SITE_OTHER): Payer: Self-pay | Admitting: Internal Medicine

## 2019-01-31 ENCOUNTER — Ambulatory Visit: Payer: Self-pay | Admitting: Internal Medicine

## 2019-01-31 ENCOUNTER — Other Ambulatory Visit: Payer: Self-pay

## 2019-01-31 ENCOUNTER — Other Ambulatory Visit (INDEPENDENT_AMBULATORY_CARE_PROVIDER_SITE_OTHER): Payer: Self-pay

## 2019-01-31 VITALS — BP 126/84 | HR 81 | Temp 98.4°F | Ht 64.0 in | Wt 236.0 lb

## 2019-01-31 DIAGNOSIS — F410 Panic disorder [episodic paroxysmal anxiety] without agoraphobia: Secondary | ICD-10-CM

## 2019-01-31 DIAGNOSIS — F439 Reaction to severe stress, unspecified: Secondary | ICD-10-CM

## 2019-01-31 DIAGNOSIS — E042 Nontoxic multinodular goiter: Secondary | ICD-10-CM

## 2019-01-31 DIAGNOSIS — R7309 Other abnormal glucose: Secondary | ICD-10-CM

## 2019-01-31 LAB — BASIC METABOLIC PANEL
BUN: 13 mg/dL (ref 6–23)
CO2: 31 mEq/L (ref 19–32)
Calcium: 9.2 mg/dL (ref 8.4–10.5)
Chloride: 99 mEq/L (ref 96–112)
Creatinine, Ser: 1 mg/dL (ref 0.40–1.20)
GFR: 70.01 mL/min (ref >60.00)
Glucose, Bld: 111 mg/dL — ABNORMAL HIGH (ref 70–99)
Potassium: 3.6 mEq/L (ref 3.5–5.1)
Sodium: 138 mEq/L (ref 135–145)

## 2019-01-31 LAB — TSH: TSH: 0.57 u[IU]/mL (ref 0.35–4.50)

## 2019-01-31 LAB — T3, FREE: T3, Free: 3.9 pg/mL (ref 2.3–4.2)

## 2019-01-31 LAB — T4, FREE: Free T4: 1 ng/dL (ref 0.60–1.60)

## 2019-01-31 LAB — HEMOGLOBIN A1C: Hgb A1c MFr Bld: 5.6 % (ref 4.6–6.5)

## 2019-01-31 MED ORDER — ALPRAZOLAM 0.5 MG PO TABS: 0.5000 mg | ORAL_TABLET | Freq: Three times a day (TID) | ORAL | 0 refills | Status: DC | PRN

## 2019-01-31 MED ORDER — ESCITALOPRAM OXALATE 5 MG PO TABS: 5.0000 mg | ORAL_TABLET | Freq: Every day | ORAL | 3 refills | Status: DC

## 2019-01-31 NOTE — Assessment & Plan Note (Signed)
Discussed.

## 2019-01-31 NOTE — Assessment & Plan Note (Signed)
F/u w/Dr Ellison 

## 2019-01-31 NOTE — Progress Notes (Signed)
Subjective:  Patient ID: Amber Reilly, female    DOB: November 17, 1965  Age: 53 y.o. MRN: [id number]  CC: No chief complaint on file.   HPI Amber Reilly presents for panic attacks ER visit on 01/26/19 - new. CT angio, labs, EKG ok C/o stress Got separated  CT angio chest IMPRESSION: No evidence of pulmonary embolus.  Mild atelectasis is visualized in the lung bases. No infiltrates.  Nonspecific thyroid enlargement as described above.  Fatty infiltration of the liver.  Electronically Signed by: Johnsie Kindred   Outpatient Medications Prior to Visit  Medication Sig Dispense Refill  . Cholecalciferol (VITAMIN D3) 2000 UNITS capsule Take 1 capsule (2,000 Units total) by mouth daily. 100 capsule 3  . oxyCODONE-acetaminophen (PERCOCET/ROXICET) 5-325 MG tablet Take 1 tablet by mouth every 4 (four) hours as needed for severe pain. 30 tablet 0  . telmisartan-hydrochlorothiazide (MICARDIS HCT) 40-12.5 MG tablet TAKE 1 TABLET BY MOUTH DAILY. PATIENT NEEDS OFFICE VISIT BEFORE REFILLS WILL BE GIVEN 90 tablet 3  . rizatriptan (MAXALT) 10 MG tablet Take 1 tablet (10 mg total) by mouth once as needed for migraine. May repeat in 2 hours if needed 12 tablet 3   No facility-administered medications prior to visit.     ROS: Review of Systems  Constitutional: Negative for activity change, appetite change, chills, fatigue and unexpected weight change.  HENT: Negative for congestion, mouth sores and sinus pressure.   Eyes: Negative for visual disturbance.  Respiratory: Negative for cough and chest tightness.   Gastrointestinal: Negative for abdominal pain and nausea.  Genitourinary: Negative for difficulty urinating, frequency and vaginal pain.  Musculoskeletal: Negative for back pain and gait problem.  Skin: Negative for pallor and rash.  Neurological: Negative for dizziness, tremors, weakness, numbness and headaches.  Psychiatric/Behavioral: Negative for confusion, sleep disturbance and  suicidal ideas.    Objective:  BP 126/84 (BP Location: Left Arm, Patient Position: Sitting, Cuff Size: Large)   Pulse 81   Temp 98.4 F (36.9 C) (Oral)   Ht 5\' 4"  (1.626 m)   Wt 236 lb (107 kg)   SpO2 99%   BMI 40.51 kg/m   BP Readings from Last 3 Encounters:  01/31/19 126/84  12/25/17 127/82  12/03/17 117/78    Wt Readings from Last 3 Encounters:  01/31/19 236 lb (107 kg)  12/25/17 228 lb 3.2 oz (103.5 kg)  12/03/17 224 lb 4 oz (101.7 kg)    Physical Exam Constitutional:      General: She is not in acute distress.    Appearance: She is well-developed.  HENT:     Head: Normocephalic.     Right Ear: External ear normal.     Left Ear: External ear normal.     Nose: Nose normal.  Eyes:     General:        Right eye: No discharge.        Left eye: No discharge.     Conjunctiva/sclera: Conjunctivae normal.     Pupils: Pupils are equal, round, and reactive to light.  Neck:     Musculoskeletal: Normal range of motion and neck supple.     Thyroid: No thyromegaly.     Vascular: No JVD.     Trachea: No tracheal deviation.  Cardiovascular:     Rate and Rhythm: Normal rate and regular rhythm.     Heart sounds: Normal heart sounds.  Pulmonary:     Effort: No respiratory distress.     Breath sounds: No stridor. No  wheezing.  Abdominal:     General: Bowel sounds are normal. There is no distension.     Palpations: Abdomen is soft. There is no mass.     Tenderness: There is no abdominal tenderness. There is no guarding or rebound.  Musculoskeletal:        General: No tenderness.  Lymphadenopathy:     Cervical: No cervical adenopathy.  Skin:    Findings: No erythema or rash.  Neurological:     Cranial Nerves: No cranial nerve deficit.     Motor: No abnormal muscle tone.     Coordination: Coordination normal.     Deep Tendon Reflexes: Reflexes normal.  Psychiatric:        Behavior: Behavior normal.        Thought Content: Thought content normal.        Judgment:  Judgment normal.    goiter    Lab Results  Component Value Date   WBC 4.0 12/03/2017   HGB 11.9 12/03/2017   HCT 37.2 12/03/2017   PLT 259 12/03/2017   GLUCOSE 92 12/03/2017   CHOL 208 (H) 01/17/2015   TRIG 344.0 (H) 01/17/2015   HDL 55.20 01/17/2015   LDLDIRECT 119.0 01/17/2015   LDLCALC 130 (H) 12/24/2011   ALT 33 12/03/2017   AST 20 12/03/2017   NA 140 12/03/2017   K 4.1 12/03/2017   CL 101 12/03/2017   CREATININE 1.11 (H) 12/03/2017   BUN 14 12/03/2017   CO2 29 12/03/2017   TSH 0.37 08/06/2017    No results found.  Assessment & Plan:   There are no diagnoses linked to this encounter.   No orders of the defined types were placed in this encounter.    Follow-up: No follow-ups on file.  Walker Kehr, MD

## 2019-01-31 NOTE — Assessment & Plan Note (Signed)
10/20 Lexapro Xanax prn ER notes reviewed

## 2019-02-04 ENCOUNTER — Telehealth: Payer: Self-pay | Admitting: Endocrinology

## 2019-02-04 ENCOUNTER — Other Ambulatory Visit: Payer: Self-pay

## 2019-02-04 NOTE — Telephone Encounter (Signed)
Please refer to Dr. Ellison's response 

## 2019-02-04 NOTE — Telephone Encounter (Signed)
During a pre-screening call patient asked if she need to come in for this visit on Monday since her labs were done at another office recently and "all was ok"  Please advise patient at [phone]

## 2019-02-04 NOTE — Telephone Encounter (Signed)
Please advise 

## 2019-02-04 NOTE — Telephone Encounter (Signed)
No, f/u here is not due until 5/21

## 2019-02-06 ENCOUNTER — Encounter: Payer: Self-pay | Admitting: Internal Medicine

## 2019-02-07 ENCOUNTER — Ambulatory Visit: Payer: Self-pay | Admitting: Endocrinology

## 2019-02-09 ENCOUNTER — Ambulatory Visit: Payer: Self-pay | Admitting: Internal Medicine

## 2019-03-16 ENCOUNTER — Encounter: Payer: Self-pay | Admitting: Internal Medicine

## 2019-03-16 ENCOUNTER — Other Ambulatory Visit: Payer: Self-pay

## 2019-03-16 ENCOUNTER — Encounter

## 2019-03-16 ENCOUNTER — Ambulatory Visit (INDEPENDENT_AMBULATORY_CARE_PROVIDER_SITE_OTHER): Payer: Self-pay | Admitting: Internal Medicine

## 2019-03-16 DIAGNOSIS — H6092 Unspecified otitis externa, left ear: Secondary | ICD-10-CM | POA: Insufficient documentation

## 2019-03-16 DIAGNOSIS — H60312 Diffuse otitis externa, left ear: Secondary | ICD-10-CM

## 2019-03-16 MED ORDER — NEOMYCIN-POLYMYXIN-HC 3.5-10000-1 OT SOLN: 3.0000 [drp] | Freq: Three times a day (TID) | OTIC | 3 refills | Status: AC

## 2019-03-16 NOTE — Assessment & Plan Note (Signed)
12/20 L - wax removed Cortisporin otic gtt if not better

## 2019-03-16 NOTE — Progress Notes (Signed)
Subjective:  Patient ID: Amber Reilly, female    DOB: [birth year]  Age: 53 y.o. MRN: [id number]  CC: No chief complaint on file.   HPI Amber Reilly presents for L ear irritation x days  Outpatient Medications Prior to Visit  Medication Sig Dispense Refill  . ALPRAZolam (XANAX) 0.5 MG tablet Take 1 tablet (0.5 mg total) by mouth 3 (three) times daily as needed for anxiety. 30 tablet 0  . Cholecalciferol (VITAMIN D3) 2000 UNITS capsule Take 1 capsule (2,000 Units total) by mouth daily. 100 capsule 3  . escitalopram (LEXAPRO) 5 MG tablet Take 1 tablet (5 mg total) by mouth daily. 30 tablet 3  . oxyCODONE-acetaminophen (PERCOCET/ROXICET) 5-325 MG tablet Take 1 tablet by mouth every 4 (four) hours as needed for severe pain. 30 tablet 0  . telmisartan-hydrochlorothiazide (MICARDIS HCT) 40-12.5 MG tablet TAKE 1 TABLET BY MOUTH DAILY. PATIENT NEEDS OFFICE VISIT BEFORE REFILLS WILL BE GIVEN 90 tablet 3  . rizatriptan (MAXALT) 10 MG tablet Take 1 tablet (10 mg total) by mouth once as needed for migraine. May repeat in 2 hours if needed 12 tablet 3   No facility-administered medications prior to visit.    ROS: Review of Systems  Constitutional: Negative for fever.  HENT: Positive for ear discharge. Negative for congestion, ear pain, facial swelling, hearing loss and voice change.     Objective:  BP 126/82 (BP Location: Left Arm, Patient Position: Sitting, Cuff Size: Large)   Pulse 88   Temp 98.3 F (36.8 C) (Oral)   Ht 5\' 4"  (1.626 m)   Wt 242 lb (109.8 kg)   SpO2 99%   BMI 41.54 kg/m   BP Readings from Last 3 Encounters:  03/16/19 126/82  01/31/19 126/84  12/25/17 127/82    Wt Readings from Last 3 Encounters:  03/16/19 242 lb (109.8 kg)  01/31/19 236 lb (107 kg)  12/25/17 228 lb 3.2 oz (103.5 kg)    Physical Exam Constitutional:      General: She is not in acute distress.    Appearance: She is well-developed.  HENT:     Head: Normocephalic.     Nose: Nose  normal.  Eyes:     General:        Right eye: No discharge.        Left eye: No discharge.     Conjunctiva/sclera: Conjunctivae normal.     Pupils: Pupils are equal, round, and reactive to light.  Neck:     Thyroid: No thyromegaly.     Vascular: No JVD.     Trachea: No tracheal deviation.  Cardiovascular:     Rate and Rhythm: Normal rate and regular rhythm.     Heart sounds: Normal heart sounds.  Pulmonary:     Effort: No respiratory distress.     Breath sounds: No stridor. No wheezing.  Abdominal:     General: Bowel sounds are normal. There is no distension.     Palpations: Abdomen is soft. There is no mass.     Tenderness: There is no abdominal tenderness. There is no guarding or rebound.  Musculoskeletal:        General: No tenderness.     Cervical back: Normal range of motion and neck supple.  Lymphadenopathy:     Cervical: No cervical adenopathy.  Skin:    Findings: No erythema or rash.  Neurological:     Cranial Nerves: No cranial nerve deficit.     Motor: No abnormal muscle tone.  Coordination: Coordination normal.     Deep Tendon Reflexes: Reflexes normal.  Psychiatric:        Behavior: Behavior normal.        Thought Content: Thought content normal.        Judgment: Judgment normal.    L ear wax removed   Lab Results  Component Value Date   WBC 4.0 12/03/2017   HGB 11.9 12/03/2017   HCT 37.2 12/03/2017   PLT 259 12/03/2017   GLUCOSE 111 (H) 01/31/2019   CHOL 208 (H) 01/17/2015   TRIG 344.0 (H) 01/17/2015   HDL 55.20 01/17/2015   LDLDIRECT 119.0 01/17/2015   LDLCALC 130 (H) 12/24/2011   ALT 33 12/03/2017   AST 20 12/03/2017   NA 138 01/31/2019   K 3.6 01/31/2019   CL 99 01/31/2019   CREATININE 1.00 01/31/2019   BUN 13 01/31/2019   CO2 31 01/31/2019   TSH 0.57 01/31/2019   HGBA1C 5.6 01/31/2019    No results found.  Assessment & Plan:   There are no diagnoses linked to this encounter.   No orders of the defined types were placed in  this encounter.    Follow-up: No follow-ups on file.  Walker Kehr, MD

## 2019-03-19 ENCOUNTER — Encounter: Payer: Self-pay | Admitting: Internal Medicine

## 2019-03-31 ENCOUNTER — Other Ambulatory Visit: Payer: Self-pay | Admitting: Internal Medicine

## 2019-03-31 MED ORDER — ALPRAZOLAM 0.5 MG PO TABS: 0.5000 mg | ORAL_TABLET | Freq: Three times a day (TID) | ORAL | 1 refills | Status: DC | PRN

## 2019-04-04 ENCOUNTER — Ambulatory Visit: Payer: Self-pay | Admitting: Internal Medicine

## 2019-07-01 ENCOUNTER — Ambulatory Visit: Payer: Self-pay | Attending: Internal Medicine

## 2019-07-01 DIAGNOSIS — Z23 Encounter for immunization: Secondary | ICD-10-CM

## 2019-07-01 NOTE — Progress Notes (Signed)
   Covid-19 Vaccination Clinic  Name:  Amber Reilly    MRN: [id number] DOB: [birth year]  07/01/2019  [name] was observed post Covid-19 immunization for 15 minutes without incident. She was provided with Vaccine Information Sheet and instruction to access the V-Safe system.   [name] was instructed to call 911 with any severe reactions post vaccine: Marland Kitchen Difficulty breathing  . Swelling of face and throat  . A fast heartbeat  . A bad rash all over body  . Dizziness and weakness   Immunizations Administered    Name Date Dose VIS Date Route   Pfizer COVID-19 Vaccine 07/01/2019  3:31 PM 0.3 mL 03/11/2019 Intramuscular   Manufacturer: [employer], [employer]   Lot: [id number]   [place]: [id number]

## 2019-07-25 ENCOUNTER — Ambulatory Visit: Payer: 59 | Attending: Internal Medicine

## 2019-07-25 DIAGNOSIS — Z23 Encounter for immunization: Secondary | ICD-10-CM

## 2019-07-25 NOTE — Progress Notes (Signed)
   Covid-19 Vaccination Clinic  Name:  Amber Reilly    MRN: [id number] DOB: [birth year]  07/25/2019  [name] was observed post Covid-19 immunization for 15 minutes without incident. She was provided with Vaccine Information Sheet and instruction to access the V-Safe system.   [name] was instructed to call 911 with any severe reactions post vaccine: Marland Kitchen Difficulty breathing  . Swelling of face and throat  . A fast heartbeat  . A bad rash all over body  . Dizziness and weakness   Immunizations Administered    Name Date Dose VIS Date Route   Pfizer COVID-19 Vaccine 07/25/2019  2:50 PM 0.3 mL 05/25/2018 Intramuscular   Manufacturer: [place]   Lot: [id number]   [facility]: [facility]  Name:  Amber Reilly    MRN: [id number] DOB: 1965/10/25  07/25/2019  [name] was observed post Covid-19 immunization for 15 minutes without incident. She was provided with Vaccine Information Sheet and instruction to access the V-Safe system.   [name] was instructed to call 911 with any severe reactions post vaccine: Marland Kitchen Difficulty breathing  . Swelling of face and throat  . A fast heartbeat  . A bad rash all over body  . Dizziness and weakness   Immunizations Administered    Name Date Dose VIS Date Route   Pfizer COVID-19 Vaccine 07/25/2019  2:50 PM 0.3 mL 05/25/2018 Intramuscular   Manufacturer: [place]   Lot: [id number]   [place]: [id number]

## 2019-08-09 ENCOUNTER — Encounter: Payer: Self-pay | Admitting: Endocrinology

## 2019-08-09 ENCOUNTER — Other Ambulatory Visit: Payer: Self-pay

## 2019-08-09 ENCOUNTER — Ambulatory Visit: Payer: 59 | Admitting: Endocrinology

## 2019-08-09 VITALS — BP 140/80 | HR 99 | Ht 64.0 in | Wt 240.0 lb

## 2019-08-09 DIAGNOSIS — E042 Nontoxic multinodular goiter: Secondary | ICD-10-CM

## 2019-08-09 NOTE — Progress Notes (Signed)
Subjective:    Patient ID: Amber Reilly, female    DOB: July 06, 1965, 54 y.o.   MRN: [id number]  HPI Pt returns for f/u of MNG (dx'ed 2006; bx in 2013 showed benign follicular nodule; f/u US in 2018 was unchanged; nuc med scan showed MNG, but no hot or cold nodule; TSH has been borderline low).  She does not notice the goiter.   Past Medical History:  Diagnosis Date  . Hypertension     Past Surgical History:  Procedure Laterality Date  . CESAREAN SECTION    . ENDOMETRIAL ABLATION    . FOOT SURGERY Left   . KNEE SURGERY Left   . LAPAROTOMY N/A 11/26/2017   Procedure: EXPLORATORY LAPAROTOMY;  Surgeon: [name], MD;  Location: [facility];  Service: Gynecology;  Laterality: N/A;  . UMBILICAL HERNIA REPAIR N/A 11/26/2017   Procedure: HERNIA REPAIR UMBILICAL ADULT;  Surgeon: [name], MD;  Location: [facility];  Service: Gynecology;  Laterality: N/A;  . UNILATERAL SALPINGECTOMY Right 11/26/2017   Procedure: RIGHT SALPINGO-OOPHORECTOMY;  Surgeon: [name], MD;  Location: [facility];  Service: Gynecology;  Laterality: Right;    Social History   Socioeconomic History  . Marital status: Legally Separated    Spouse name: Not on file  . Number of children: Not on file  . Years of education: Not on file  . Highest education level: Not on file  Occupational History  . Not on file  Tobacco Use  . Smoking status: Never Smoker  . Smokeless tobacco: Never Used  Substance and Sexual Activity  . Alcohol use: No  . Drug use: No  . Sexual activity: Yes  Other Topics Concern  . Not on file  Social History Narrative  . Not on file   Social Determinants of Health   Financial Resource Strain:   . Difficulty of Paying Living Expenses:   Food Insecurity:   . Worried About Charity fundraiser in the Last Year:   . Arboriculturist in the Last Year:   Transportation Needs:   . Film/video editor (Medical):   Marland Kitchen Lack of Transportation (Non-Medical):   Physical Activity:   . Days of  Exercise per Week:   . Minutes of Exercise per Session:   Stress:   . Feeling of Stress :   Social Connections:   . Frequency of Communication with Friends and Family:   . Frequency of Social Gatherings with Friends and Family:   . Attends Religious Services:   . Active Member of Clubs or Organizations:   . Attends Archivist Meetings:   Marland Kitchen Marital Status:   Intimate Partner Violence:   . Fear of Current or Ex-Partner:   . Emotionally Abused:   Marland Kitchen Physically Abused:   . Sexually Abused:     Current Outpatient Medications on File Prior to Visit  Medication Sig Dispense Refill  . Cholecalciferol (VITAMIN D3) 2000 UNITS capsule Take 1 capsule (2,000 Units total) by mouth daily. 100 capsule 3  . telmisartan-hydrochlorothiazide (MICARDIS HCT) 40-12.5 MG tablet TAKE 1 TABLET BY MOUTH DAILY. PATIENT NEEDS OFFICE VISIT BEFORE REFILLS WILL BE GIVEN 90 tablet 3   No current facility-administered medications on file prior to visit.    Allergies  Allergen Reactions  . Tramadol Other (See Comments)    Bad headache  . Latex Swelling    Selling at the site of contact only  . Morphine And Related     Patient does not like the way  it makes her feel     Family History  Problem Relation Age of Onset  . Heart attack Father   . Cancer Other   . Goiter Paternal Aunt   . Colon cancer Neg Hx   . Esophageal cancer Neg Hx   . Stomach cancer Neg Hx     BP 140/80   Pulse 99   Ht 5\' 4"  (1.626 m)   Wt 240 lb (108.9 kg)   SpO2 94%   BMI 41.20 kg/m    Review of Systems Denies neck pain    Objective:   Physical Exam VITAL SIGNS:  See vs page GENERAL: no distress.  NECK: MNG, approx 5-10 times normal size.    Lab Results  Component Value Date   TSH 0.57 01/31/2019       Assessment & Plan:  HTN: is noted today MNG: due for recheck  Patient Instructions  Your blood pressure is high today.  Please see your primary care provider, to have it rechecked Let's recheck the  ultrasound.  you will receive a phone call, about a day and time for an appointment.  If it is unchanged, please come back for a follow-up appointment in 2 years.

## 2019-08-09 NOTE — Patient Instructions (Addendum)
Your blood pressure is high today.  Please see your primary care provider, to have it rechecked Let's recheck the ultrasound.  you will receive a phone call, about a day and time for an appointment.  If it is unchanged, please come back for a follow-up appointment in 2 years.

## 2019-08-19 ENCOUNTER — Ambulatory Visit
Admission: RE | Admit: 2019-08-19 | Discharge: 2019-08-19 | Disposition: A | Discharge status: Home / Self Care | Payer: 59 | Source: Ambulatory Visit | Attending: Endocrinology | Admitting: Endocrinology

## 2019-08-19 ENCOUNTER — Other Ambulatory Visit: Payer: Self-pay | Admitting: Endocrinology

## 2019-08-19 DIAGNOSIS — E042 Nontoxic multinodular goiter: Secondary | ICD-10-CM

## 2019-08-29 ENCOUNTER — Other Ambulatory Visit: Payer: Self-pay | Admitting: Internal Medicine

## 2019-08-30 ENCOUNTER — Other Ambulatory Visit: Payer: Self-pay | Admitting: Internal Medicine

## 2019-08-30 DIAGNOSIS — Z1231 Encounter for screening mammogram for malignant neoplasm of breast: Secondary | ICD-10-CM

## 2019-09-01 ENCOUNTER — Other Ambulatory Visit (HOSPITAL_COMMUNITY)
Admission: RE | Admit: 2019-09-01 | Discharge: 2019-09-01 | Disposition: A | Discharge status: Home / Self Care | Payer: 59 | Source: Ambulatory Visit | Attending: Radiology | Admitting: Radiology

## 2019-09-01 ENCOUNTER — Ambulatory Visit
Admission: RE | Admit: 2019-09-01 | Discharge: 2019-09-01 | Disposition: A | Discharge status: Home / Self Care | Payer: 59 | Source: Ambulatory Visit | Attending: Endocrinology | Admitting: Endocrinology

## 2019-09-01 DIAGNOSIS — E042 Nontoxic multinodular goiter: Secondary | ICD-10-CM

## 2019-09-01 DIAGNOSIS — E041 Nontoxic single thyroid nodule: Secondary | ICD-10-CM | POA: Diagnosis present

## 2019-09-02 LAB — CYTOLOGY - NON PAP

## 2019-09-06 ENCOUNTER — Other Ambulatory Visit: Payer: 59

## 2019-09-07 ENCOUNTER — Other Ambulatory Visit: Payer: Self-pay

## 2019-09-07 ENCOUNTER — Ambulatory Visit (INDEPENDENT_AMBULATORY_CARE_PROVIDER_SITE_OTHER): Payer: 59

## 2019-09-07 DIAGNOSIS — Z1231 Encounter for screening mammogram for malignant neoplasm of breast: Secondary | ICD-10-CM

## 2020-01-24 ENCOUNTER — Other Ambulatory Visit: Payer: Self-pay

## 2020-01-24 ENCOUNTER — Encounter: Payer: Self-pay | Admitting: Internal Medicine

## 2020-01-24 ENCOUNTER — Ambulatory Visit: Payer: 59 | Admitting: Internal Medicine

## 2020-01-24 VITALS — BP 126/76 | HR 90 | Temp 98.7°F | Ht 64.0 in | Wt 238.0 lb

## 2020-01-24 DIAGNOSIS — E042 Nontoxic multinodular goiter: Secondary | ICD-10-CM | POA: Diagnosis not present

## 2020-01-24 DIAGNOSIS — I10 Essential (primary) hypertension: Secondary | ICD-10-CM | POA: Diagnosis not present

## 2020-01-24 DIAGNOSIS — Z0000 Encounter for general adult medical examination without abnormal findings: Secondary | ICD-10-CM

## 2020-01-24 DIAGNOSIS — Z1159 Encounter for screening for other viral diseases: Secondary | ICD-10-CM

## 2020-01-24 DIAGNOSIS — R739 Hyperglycemia, unspecified: Secondary | ICD-10-CM | POA: Diagnosis not present

## 2020-01-24 LAB — CBC WITH DIFFERENTIAL/PLATELET
Basophils Absolute: 0 10*3/uL (ref 0.0–0.1)
Basophils Relative: 0.7 % (ref 0.0–3.0)
Eosinophils Absolute: 0.2 10*3/uL (ref 0.0–0.7)
Eosinophils Relative: 4.9 % (ref 0.0–5.0)
HCT: 37.4 % (ref 36.0–46.0)
Hemoglobin: 12 g/dL (ref 12.0–15.0)
Lymphocytes Relative: 36.4 % (ref 12.0–46.0)
Lymphs Abs: 1.6 10*3/uL (ref 0.7–4.0)
MCHC: 32.3 g/dL (ref 30.0–36.0)
MCV: 86.5 fl (ref 78.0–100.0)
Monocytes Absolute: 0.3 10*3/uL (ref 0.1–1.0)
Monocytes Relative: 7.9 % (ref 3.0–12.0)
Neutro Abs: 2.1 10*3/uL (ref 1.4–7.7)
Neutrophils Relative %: 50.1 % (ref 43.0–77.0)
Platelets: 233 10*3/uL (ref 150.0–400.0)
RBC: 4.32 Mil/uL (ref 3.87–5.11)
RDW: 14.7 % (ref 11.5–15.5)
WBC: 4.3 10*3/uL (ref 4.0–10.5)

## 2020-01-24 LAB — HEPATIC FUNCTION PANEL
ALT: 23 U/L (ref 0–35)
AST: 20 U/L (ref 0–37)
Albumin: 4 g/dL (ref 3.5–5.2)
Alkaline Phosphatase: 63 U/L (ref 39–117)
Bilirubin, Direct: 0.1 mg/dL (ref 0.0–0.3)
Total Bilirubin: 0.3 mg/dL (ref 0.2–1.2)
Total Protein: 7.2 g/dL (ref 6.0–8.3)

## 2020-01-24 LAB — BASIC METABOLIC PANEL
BUN: 16 mg/dL (ref 6–23)
CO2: 32 mEq/L (ref 19–32)
Calcium: 9.4 mg/dL (ref 8.4–10.5)
Chloride: 100 mEq/L (ref 96–112)
Creatinine, Ser: 1.07 mg/dL (ref 0.40–1.20)
GFR: 58.85 mL/min — ABNORMAL LOW (ref >60.00)
Glucose, Bld: 83 mg/dL (ref 70–99)
Potassium: 3.5 mEq/L (ref 3.5–5.1)
Sodium: 139 mEq/L (ref 135–145)

## 2020-01-24 LAB — LIPID PANEL
Cholesterol: 221 mg/dL — ABNORMAL HIGH (ref 0–200)
HDL: 47.7 mg/dL (ref >39.00)
NonHDL: 173.08
Total CHOL/HDL Ratio: 5
Triglycerides: 233 mg/dL — ABNORMAL HIGH (ref 0.0–149.0)
VLDL: 46.6 mg/dL — ABNORMAL HIGH (ref 0.0–40.0)

## 2020-01-24 LAB — TSH: TSH: 0.44 u[IU]/mL (ref 0.35–4.50)

## 2020-01-24 LAB — HEMOGLOBIN A1C: Hgb A1c MFr Bld: 5.8 % (ref 4.6–6.5)

## 2020-01-24 LAB — LDL CHOLESTEROL, DIRECT: Direct LDL: 145 mg/dL

## 2020-01-24 NOTE — Progress Notes (Signed)
Subjective:  Patient ID: Amber Reilly, female    DOB: 1965/10/03  Age: 54 y.o. MRN: [id number]  CC: Annual Exam and Hypertension  This visit occurred during the SARS-CoV-2 public health emergency.  Safety protocols were in place, including screening questions prior to the visit, additional usage of staff PPE, and extensive cleaning of exam room while observing appropriate contact time as indicated for disinfecting solutions.    HPI [name] presents for a CPX.  She is scheduled for total knee replacement soon.  She had a preop EKG done elsewhere.  She tells me her blood pressure is well controlled.  She is active and denies any recent episodes of chest pain, shortness of breath, palpitations, edema, or fatigue.  Outpatient Medications Prior to Visit  Medication Sig Dispense Refill  . Cholecalciferol (VITAMIN D3) 2000 UNITS capsule Take 1 capsule (2,000 Units total) by mouth daily. 100 capsule 3  . telmisartan-hydrochlorothiazide (MICARDIS HCT) 40-12.5 MG tablet TAKE 1 TABLET BY MOUTH EVERY DAY 90 tablet 3   No facility-administered medications prior to visit.    ROS Review of Systems  Constitutional: Negative.  Negative for appetite change, diaphoresis, fatigue, fever and unexpected weight change.  HENT: Negative.  Negative for trouble swallowing.   Eyes: Negative.   Respiratory: Negative for apnea, cough, chest tightness, shortness of breath and wheezing.   Cardiovascular: Negative for chest pain, palpitations and leg swelling.  Gastrointestinal: Negative for abdominal pain, constipation, diarrhea, nausea and vomiting.  Endocrine: Negative.  Negative for cold intolerance and heat intolerance.  Genitourinary: Negative.  Negative for difficulty urinating.  Musculoskeletal: Positive for arthralgias. Negative for myalgias and neck pain.  Skin: Negative.  Negative for color change, pallor and rash.  Neurological: Negative.  Negative for dizziness, weakness, light-headedness  and numbness.  Hematological: Negative for adenopathy. Does not bruise/bleed easily.  Psychiatric/Behavioral: Negative.     Objective:  BP 126/76   Pulse 90   Temp 98.7 F (37.1 C) (Oral)   Ht 5\' 4"  (1.626 m)   Wt 238 lb (108 kg)   SpO2 95%   BMI 40.85 kg/m   BP Readings from Last 3 Encounters:  01/24/20 126/76  08/09/19 140/80  03/16/19 126/82    Wt Readings from Last 3 Encounters:  01/24/20 238 lb (108 kg)  08/09/19 240 lb (108.9 kg)  03/16/19 242 lb (109.8 kg)    Physical Exam Vitals reviewed.  Constitutional:      Appearance: Normal appearance.  HENT:     Nose: Nose normal.     Mouth/Throat:     Mouth: Mucous membranes are moist.  Eyes:     General: No scleral icterus.    Conjunctiva/sclera: Conjunctivae normal.  Neck:     Thyroid: Thyromegaly present. No thyroid mass or thyroid tenderness.  Cardiovascular:     Rate and Rhythm: Normal rate and regular rhythm.     Heart sounds: No murmur heard.   Pulmonary:     Effort: Pulmonary effort is normal.     Breath sounds: No stridor. No wheezing, rhonchi or rales.  Abdominal:     General: Abdomen is flat. Bowel sounds are normal. There is no distension.     Palpations: Abdomen is soft. There is no hepatomegaly, splenomegaly or mass.     Tenderness: There is no abdominal tenderness.  Musculoskeletal:        General: Normal range of motion.     Cervical back: Neck supple.     Right lower leg: No edema.  Left lower leg: No edema.  Lymphadenopathy:     Cervical: No cervical adenopathy.  Skin:    General: Skin is warm and dry.     Coloration: Skin is not pale.  Neurological:     General: No focal deficit present.     Mental Status: She is alert.  Psychiatric:        Mood and Affect: Mood normal.        Behavior: Behavior normal.     Lab Results  Component Value Date   WBC 4.3 01/24/2020   HGB 12.0 01/24/2020   HCT 37.4 01/24/2020   PLT 233.0 01/24/2020   GLUCOSE 83 01/24/2020   CHOL 221 (H)  01/24/2020   TRIG 233.0 (H) 01/24/2020   HDL 47.70 01/24/2020   LDLDIRECT 145.0 01/24/2020   LDLCALC 130 (H) 12/24/2011   ALT 23 01/24/2020   AST 20 01/24/2020   NA 139 01/24/2020   K 3.5 01/24/2020   CL 100 01/24/2020   CREATININE 1.07 01/24/2020   BUN 16 01/24/2020   CO2 32 01/24/2020   TSH 0.44 01/24/2020   HGBA1C 5.8 01/24/2020    Narrative Performed by MUSE Ventricular Rate          73    BPM          Atrial Rate            73    BPM          P-R Interval            160    ms           QRS Duration            74    ms          Q-T Interval            390    ms           QTC                429    ms           P Axis               39    degrees        R Axis               -27    degrees        T Axis               32    degrees         Sinus rhythm   Low voltage QRS, consider pulmonary disease or obesity   Inferior infarct , age undetermined     No previous ECGs available   Confirmed by Mathis Bud ([id number]) on 01/17/2020 11:23:25 AM  Procedure Note  McGukin, Elta Guadel[place]., MD - 01/17/2020  Formatting of this note might be different from the original.  Ventricular Rate          73    BPM          Atrial Rate            73    BPM          P-R Interval           160    ms           QRS Duration  74    ms           Q-T Interval            390    ms           QTC                429    ms           P Axis               39    degrees        R Axis               -27    degrees        T Axis               32    degrees         Sinus  rhythm   Low voltage QRS, consider pulmonary disease or obesity   Inferior infarct , age undetermined       [name] FNA BX THYROID 1ST LESION AFIRMA  Result Date: 09/01/2019 INDICATION: Patient with history of multinodular goiter and previous benign biopsies of left thyroid nodules in 2006/2013. Thyroid ultrasound performed on 08/19/2019 revealed progressive thyromegaly with enlarged medial right thyroid nodule measuring 2.8 cm which meets criteria for biopsy. She presents today for the procedure. EXAM: ULTRASOUND GUIDED FINE NEEDLE ASPIRATION BIOPSY OF RIGHT MID THYROID NODULE COMPARISON:  Thyroid ultrasound dated 08/19/2019 MEDICATIONS: None COMPLICATIONS: None immediate. TECHNIQUE: Informed written consent was obtained from the patient after a discussion of the risks, benefits and alternatives to treatment. Questions regarding the procedure were encouraged and answered. A timeout was performed prior to the initiation of the procedure. Pre-procedural ultrasound scanning demonstrated unchanged size and appearance of the indeterminate nodule within the right mid thyroid lobe The procedure was planned. The neck was prepped in the usual sterile fashion, and a sterile drape was applied covering the operative field. A timeout was performed prior to the initiation of the procedure. Local anesthesia was provided with 1% lidocaine. Under direct ultrasound guidance, 5 FNA biopsies were performed of the right mid/medial thyroid nodule with 25 gauge needles. Multiple ultrasound images were saved for procedural documentation purposes. The samples were prepared and submitted to pathology as well as for Afirma testing. Limited post procedural scanning was negative for hematoma or additional complication. Dressings were placed. The patient tolerated the above procedures procedure well without immediate postprocedural complication. FINDINGS: Nodule reference number based on prior diagnostic ultrasound: 1 Maximum size: 2.8 cm  Location: Right; Mid ACR TI-RADS risk category: TR3 (3 points) Reason for biopsy: meets ACR TI-RADS criteria Ultrasound imaging confirms appropriate placement of the needles within the thyroid nodule. IMPRESSION: Technically successful ultrasound guided fine needle aspiration biopsy of right mid/medial thyroid nodule. Final pathology pending. Read by: Rowe Robert, PA-C Electronically Signed   By: Jerilynn Mages.  Shick M.D.   On: 09/01/2019 11:36    Assessment & Plan:   Modesty was seen today for annual exam and hypertension.  Diagnoses and all orders for this visit:  Primary hypertension- Her blood pressure is well controlled.  Electrolytes and renal function are normal. -     CBC with Differential/Platelet; Future -     Basic metabolic panel; Future -     Hepatic function panel; Future -     Hepatic function panel -     Basic metabolic panel -     CBC  with Differential/Platelet  Multinodular goiter- She is euthyroid. -     TSH; Future -     TSH  Routine general medical examination at a health care facility- Exam completed, labs reviewed-statin therapy is not indicated, vaccines reviewed and updated-she deferred on a flu vaccine, cancer screenings are up-to-date, patient education material was given. -     Lipid panel; Future -     Hepatitis C antibody; Future -     HIV Antibody (routine testing w rflx); Future -     HIV Antibody (routine testing w rflx) -     Hepatitis C antibody -     Lipid panel  Need for hepatitis C screening test -     Hepatitis C antibody; Future -     Hepatitis C antibody  Hyperglycemia- Her A1c is barely elevated at 5.8%.  She continues to work on her lifestyle modifications. -     Hemoglobin A1c; Future -     Hemoglobin A1c  Other orders -     LDL cholesterol, direct   I am having Brynda L. Mofield maintain her Vitamin D3 and telmisartan-hydrochlorothiazide.  No orders of the defined types were placed in this encounter.    Follow-up: Return in about 6  months (around 07/24/2020).  Scarlette Calico, MD

## 2020-01-24 NOTE — Patient Instructions (Signed)
Health Maintenance, Female Adopting a healthy lifestyle and getting preventive care are important in promoting health and wellness. Ask your health care provider about:  The right schedule for you to have regular tests and exams.  Things you can do on your own to prevent diseases and keep yourself healthy. What should I know about diet, weight, and exercise? Eat a healthy diet   Eat a diet that includes plenty of vegetables, fruits, low-fat dairy products, and lean protein.  Do not eat a lot of foods that are high in solid fats, added sugars, or sodium. Maintain a healthy weight Body mass index (BMI) is used to identify weight problems. It estimates body fat based on height and weight. Your health care provider can help determine your BMI and help you achieve or maintain a healthy weight. Get regular exercise Get regular exercise. This is one of the most important things you can do for your health. Most adults should:  Exercise for at least 150 minutes each week. The exercise should increase your heart rate and make you sweat (moderate-intensity exercise).  Do strengthening exercises at least twice a week. This is in addition to the moderate-intensity exercise.  Spend less time sitting. Even light physical activity can be beneficial. Watch cholesterol and blood lipids Have your blood tested for lipids and cholesterol at 54 years of age, then have this test every 5 years. Have your cholesterol levels checked more often if:  Your lipid or cholesterol levels are high.  You are older than 54 years of age.  You are at high risk for heart disease. What should I know about cancer screening? Depending on your health history and family history, you may need to have cancer screening at various ages. This may include screening for:  Breast cancer.  Cervical cancer.  Colorectal cancer.  Skin cancer.  Lung cancer. What should I know about heart disease, diabetes, and high blood  pressure? Blood pressure and heart disease  High blood pressure causes heart disease and increases the risk of stroke. This is more likely to develop in people who have high blood pressure readings, are of African descent, or are overweight.  Have your blood pressure checked: ? Every 3-5 years if you are 18-39 years of age. ? Every year if you are 40 years old or older. Diabetes Have regular diabetes screenings. This checks your fasting blood sugar level. Have the screening done:  Once every three years after age 40 if you are at a normal weight and have a low risk for diabetes.  More often and at a younger age if you are overweight or have a high risk for diabetes. What should I know about preventing infection? Hepatitis B If you have a higher risk for hepatitis B, you should be screened for this virus. Talk with your health care provider to find out if you are at risk for hepatitis B infection. Hepatitis C Testing is recommended for:  Everyone born from 1945 through 1965.  Anyone with known risk factors for hepatitis C. Sexually transmitted infections (STIs)  Get screened for STIs, including gonorrhea and chlamydia, if: ? You are sexually active and are younger than 54 years of age. ? You are older than 54 years of age and your health care provider tells you that you are at risk for this type of infection. ? Your sexual activity has changed since you were last screened, and you are at increased risk for chlamydia or gonorrhea. Ask your health care provider if   you are at risk.  Ask your health care provider about whether you are at high risk for HIV. Your health care provider may recommend a prescription medicine to help prevent HIV infection. If you choose to take medicine to prevent HIV, you should first get tested for HIV. You should then be tested every 3 months for as long as you are taking the medicine. Pregnancy  If you are about to stop having your period (premenopausal) and  you may become pregnant, seek counseling before you get pregnant.  Take 400 to 800 micrograms (mcg) of folic acid every day if you become pregnant.  Ask for birth control (contraception) if you want to prevent pregnancy. Osteoporosis and menopause Osteoporosis is a disease in which the bones lose minerals and strength with aging. This can result in bone fractures. If you are 65 years old or older, or if you are at risk for osteoporosis and fractures, ask your health care provider if you should:  Be screened for bone loss.  Take a calcium or vitamin D supplement to lower your risk of fractures.  Be given hormone replacement therapy (HRT) to treat symptoms of menopause. Follow these instructions at home: Lifestyle  Do not use any products that contain nicotine or tobacco, such as cigarettes, e-cigarettes, and chewing tobacco. If you need help quitting, ask your health care provider.  Do not use street drugs.  Do not share needles.  Ask your health care provider for help if you need support or information about quitting drugs. Alcohol use  Do not drink alcohol if: ? Your health care provider tells you not to drink. ? You are pregnant, may be pregnant, or are planning to become pregnant.  If you drink alcohol: ? Limit how much you use to 0-1 drink a day. ? Limit intake if you are breastfeeding.  Be aware of how much alcohol is in your drink. In the [place] one drink equals one 12 oz bottle of beer (355 mL), one 5 oz glass of wine (148 mL), or one 1 oz glass of hard liquor (44 mL). General instructions  Schedule regular health, dental, and eye exams.  Stay current with your vaccines.  Tell your health care provider if: ? You often feel depressed. ? You have ever been abused or do not feel safe at home. Summary  Adopting a healthy lifestyle and getting preventive care are important in promoting health and wellness.  Follow your health care provider's instructions about healthy  diet, exercising, and getting tested or screened for diseases.  Follow your health care provider's instructions on monitoring your cholesterol and blood pressure. This information is not intended to replace advice given to you by your health care provider. Make sure you discuss any questions you have with your health care provider. Document Revised: 03/10/2018 Document Reviewed: 03/10/2018 [employer] Patient Education  2020 [employer]  

## 2020-01-25 LAB — HEPATITIS C ANTIBODY
Hepatitis C Ab: NONREACTIVE
SIGNAL TO CUT-OFF: 0.01 (ref <1.00)

## 2020-01-25 LAB — HIV ANTIBODY (ROUTINE TESTING W REFLEX): HIV 1&2 Ab, 4th Generation: NONREACTIVE

## 2020-02-03 ENCOUNTER — Ambulatory Visit: Payer: 59 | Admitting: Rehabilitative and Restorative Service Providers"

## 2020-02-20 ENCOUNTER — Other Ambulatory Visit: Payer: Self-pay

## 2020-02-20 ENCOUNTER — Ambulatory Visit (INDEPENDENT_AMBULATORY_CARE_PROVIDER_SITE_OTHER): Payer: 59 | Admitting: Rehabilitative and Restorative Service Providers"

## 2020-02-20 DIAGNOSIS — M25561 Pain in right knee: Secondary | ICD-10-CM

## 2020-02-20 DIAGNOSIS — M6281 Muscle weakness (generalized): Secondary | ICD-10-CM | POA: Diagnosis not present

## 2020-02-20 DIAGNOSIS — R2689 Other abnormalities of gait and mobility: Secondary | ICD-10-CM

## 2020-02-20 NOTE — Therapy (Signed)
Amber Reilly, [place], [address] Phone: [phone]   Fax:  [phone]  Physical Therapy Evaluation  Patient Details  Name: Amber Reilly MRN: [id number] Date of Birth: September 26, 1965 Referring Provider (PT): Tracie Harrier, MD   Encounter Date: 02/20/2020   PT End of Session - 02/20/20 1115    Visit Number 1    Number of Visits 16    Date for PT Re-Evaluation 04/20/20    Authorization Type UHC    PT Start Time 1110    PT Stop Time 1150    PT Time Calculation (min) 40 min    Activity Tolerance Patient limited by pain    Behavior During Therapy [facility] for tasks assessed/performed           Past Medical History:  Diagnosis Date  . Hypertension     Past Surgical History:  Procedure Laterality Date  . CESAREAN SECTION    . ENDOMETRIAL ABLATION    . FOOT SURGERY Left   . KNEE SURGERY Left   . LAPAROTOMY N/A 11/26/2017   Procedure: EXPLORATORY LAPAROTOMY;  Surgeon: [name], MD;  Location: [facility];  Service: Gynecology;  Laterality: N/A;  . UMBILICAL HERNIA REPAIR N/A 11/26/2017   Procedure: HERNIA REPAIR UMBILICAL ADULT;  Surgeon: [name], MD;  Location: [facility];  Service: Gynecology;  Laterality: N/A;  . UNILATERAL SALPINGECTOMY Right 11/26/2017   Procedure: RIGHT SALPINGO-OOPHORECTOMY;  Surgeon: [name], MD;  Location: [facility];  Service: Gynecology;  Laterality: Right;    There were no vitals filed for this visit.    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  She had home health therapy when returning home.  She has been doing exercises regularly and is now walking with SPC.    Pertinent History HTN, h/o migraines    Patient Stated Goals Have mobility back and improve mobility.    Currently in Pain? Yes    Pain Score 6     Pain Location Knee    Pain Orientation Right    Pain Descriptors / Indicators Sore    Pain Type Surgical  pain    Pain Onset 1 to 4 weeks ago    Pain Frequency Intermittent    Aggravating Factors  sitting too long; cooler/rainy weather today, hurts at night    Pain Relieving Factors movement              [facility] PT Assessment - 02/20/20 1118      Assessment   Medical Diagnosis R TKR    Referring Provider (PT) Tracie Harrier, MD    Onset Date/Surgical Date 01/31/20    Hand Dominance Right      Restrictions   Weight Bearing Restrictions No      Balance Screen   Has the patient fallen in the past 6 months No    Has the patient had a decrease in activity level because of a fear of falling?  No    Is the patient reluctant to leave their home because of a fear of falling?  No      Home Environment   Living Environment Private residence    Type of [place] Two level    [place] - single point      Prior Function   Level of [place] Full time employment  Vocation Requirements sitting down for work at [employer] working from home    Leisure grand kids      Observation/Other Assessments   Focus on Therapeutic Outcomes (FOTO)  54% limited      Observation/Other Assessments-Edema    Edema Circumferential      Circumferential Edema   Circumferential - Right *Edema noted-- patient wearing pants today that do not pull above knee.        Sensation   Light Touch Appears Intact      ROM / Strength   AROM / PROM / Strength AROM;Strength;PROM      AROM   Overall AROM  Deficits    Overall AROM Comments 23 degree extensor lag    AROM Assessment Site Knee    Right/Left Knee Right    Right Knee Extension -15   supine with quad set   Right Knee Flexion 48   A/AROM seated heel slide     PROM   Overall PROM  Deficits    PROM Assessment Site Knee    Right/Left Knee Right    Right Knee Extension -12    Right Knee Flexion 55      Strength   Overall Strength Deficits    Overall Strength Comments *patient has dec'd  quad activation needing cues during quad set      Flexibility   Soft Tissue Assessment /Muscle Length yes    Hamstrings limited flexibility in HS and gastroc      Palpation   Patella mobility TBA    Palpation comment tenderness to palpation in quads, HS, gastrocs      Ambulation/Gait   Ambulation/Gait Yes    Ambulation/Gait Assistance 6: Modified independent (Device/Increase time)    Ambulation Distance (Feet) 100 Feet    Assistive device Straight cane    Gait Pattern Right flexed knee in stance;Antalgic;Decreased weight shift to right;Step-through pattern   maintains R knee in mild flexion t/o gait cycle   Stairs Yes    Stairs Assistance 6: Modified independent (Device/Increase time)   per reports; PT to assess (focused on HEP to gain ROM)                     Objective measurements completed on examination: See above findings.       [facility] Adult PT Treatment/Exercise - 02/20/20 1118      Exercises   Exercises Knee/Hip      Knee/Hip Exercises: Stretches   Active Hamstring Stretch Right;1 rep;30 seconds    Gastroc Stretch Right    Gastroc Stretch Limitations demo'd gastroc stretch during vaso (to review)    Other Knee/Hip Stretches Prone knee hang      Knee/Hip Exercises: Seated   Heel Slides AROM;10 reps      Knee/Hip Exercises: Supine   Quad Sets Strengthening;Right;5 reps    Heel Slides AAROM;Right;5 reps      Modalities   Modalities Vasopneumatic      Vasopneumatic   Number Minutes Vasopneumatic  10 minutes    Vasopnuematic Location  Knee    Vasopneumatic Pressure Low    Vasopneumatic Temperature  34                  PT Education - 02/20/20 2114    Education Details HEP established    Person(s) Educated Patient    Methods Explanation;Demonstration;Handout    Comprehension Verbalized understanding;Returned demonstration            PT Short Term Goals - 02/20/20 2128  PT SHORT TERM GOAL #1   Title The patient will be indep  with initial HEP.    Time 4    Period Weeks    Target Date 03/21/20      PT SHORT TERM GOAL #2   Title The patient will improve R knee flexion to 80 degrees A/ROM    Baseline A/AROM 48 deg    Time 4    Period Weeks    Target Date 03/21/20      PT SHORT TERM GOAL #3   Title The patient will improve AROM R knee to -9 degrees extension.    Baseline -15    Time 4    Period Weeks    Target Date 03/21/20      PT SHORT TERM GOAL #4   Title The patient will ambulate household distances without a device and improved knee flexion during gait cycle.    Time 4    Period Weeks    Target Date 03/21/20             PT Long Term Goals - 02/20/20 2115      PT LONG TERM GOAL #1   Title The patient will be indep with HEP.    Time 8    Period Weeks    Target Date 04/20/20      PT LONG TERM GOAL #2   Title The patient will reduce limitation per FOTO to < or equal to 24% limitation.    Time 8    Period Weeks    Target Date 04/20/20      PT LONG TERM GOAL #3   Title The patient will improve A/ROM R knee to 5 to 105 degrees.    Time 8    Period Weeks    Target Date 04/20/20      PT LONG TERM GOAL #4   Title The patient will negotiate community surfaces including stairs, curbs, unlevel ground without a device independently x500 ft.    Time 8    Period Weeks    Target Date 04/20/20      PT LONG TERM GOAL #5   Title The patient will report R knee pain < or equal to 2/10.    Baseline 6/10 today    Time 8    Period Weeks    Target Date 04/20/20                  Plan - 02/20/20 2132    Clinical Impression Statement The patient is a 54 yo female presenting to OP physical therapy s/p R TKR 01/31/20.  She presents with impairments in A/ROM, P/ROM, muscle strength, myofascial tightness, dec'd flexibility, abnormality of gait, and also has localized edema.  PT to address deficits to promote return to prior functional status.    Personal Factors and Comorbidities Comorbidity 2      Comorbidities HTN, h/o migraines    Examination-Activity Limitations Locomotion Level;Squat;Stairs;Stand    Examination-Participation Restrictions Community Activity;Driving;Cleaning    Stability/Clinical Decision Making Stable/Uncomplicated    Clinical Decision Making Low    Rehab Potential Good    PT Frequency 2x / week    PT Duration 8 weeks    PT Treatment/Interventions ADLs/Self Care Home Management;Vasopneumatic Device;Taping;Manual techniques;Therapeutic activities;Therapeutic exercise;Functional mobility training;Gait training;Stair training;Patient/family education;Electrical Stimulation;Moist Heat;Cryotherapy    PT Next Visit Plan Emphasize A/ROM, work on Hydrographic surveyor and gait mechanics to increase WS and encourage knee flexion during swing phase, progress quad initiation/strength    PT Home  Exercise Plan Access Code: [email or web address]    Consulted and Agree with Plan of Care Patient           Patient will benefit from skilled therapeutic intervention in order to improve the following deficits and impairments:  Pain, Decreased range of motion, Abnormal gait, Decreased strength, Increased edema, Hypomobility, Impaired flexibility, Increased fascial restricitons  Visit Diagnosis: Acute pain of right knee  Muscle weakness (generalized)  Other abnormalities of gait and mobility     Problem List Patient Active Problem List   Diagnosis Date Noted  . Need for hepatitis C screening test 01/24/2020  . Hyperglycemia 01/24/2020  . Ovarian cyst 11/26/2017  . Obesity (BMI 35.0-39.9 without comorbidity) 07/14/2017  . Multinodular goiter 07/17/2016  . Migraine 06/30/2016  . Plantar fasciitis of left foot 09/16/2013  . LBP (low back pain) 09/05/2013  . Hypertension 04/05/2013  . Vaginitis and vulvovaginitis 03/15/2013  . Hydradenitis 08/20/2011    Perry Park, PT 02/20/2020, 9:37 PM  [facility] [place], [place], [address] Phone: [phone]   Fax:  [phone]  Name: Amber Reilly MRN: [id number] Date of Birth: June 11, 1965

## 2020-02-20 NOTE — Patient Instructions (Signed)
Access Code: [email or web address] URL: [email or web address] Date: 02/20/2020 Prepared by: Rudell Cobb  Exercises Prone Knee Extension Hang - 2 x daily - 7 x weekly - 1 sets - 1 reps - 2-5 minutes hold Quad Setting and Stretching - 2 x daily - 7 x weekly - 1 sets - 10 reps Supine Heel Slide with Strap - 2 x daily - 7 x weekly - 1 sets - 10 reps Seated Heel Slide - 2 x daily - 7 x weekly - 1 sets - 10 reps Seated Hamstring Stretch with Chair - 2 x daily - 7 x weekly - 1 sets - 10 reps Gastroc Stretch on Wall - 2 x daily - 7 x weekly - 1 sets - 3 reps - 30 seconds hold

## 2020-02-22 ENCOUNTER — Other Ambulatory Visit: Payer: Self-pay

## 2020-02-22 ENCOUNTER — Ambulatory Visit (INDEPENDENT_AMBULATORY_CARE_PROVIDER_SITE_OTHER): Payer: 59 | Admitting: Physical Therapy

## 2020-02-22 DIAGNOSIS — M6281 Muscle weakness (generalized): Secondary | ICD-10-CM | POA: Diagnosis not present

## 2020-02-22 DIAGNOSIS — R2689 Other abnormalities of gait and mobility: Secondary | ICD-10-CM | POA: Diagnosis not present

## 2020-02-22 DIAGNOSIS — M25561 Pain in right knee: Secondary | ICD-10-CM | POA: Diagnosis not present

## 2020-02-22 NOTE — Therapy (Signed)
[place], [place], [address] Phone: [phone]   Fax:  [phone]  Physical Therapy Treatment  Patient Details  Name: Amber Reilly MRN: [id number] Date of Birth: 11-01-1965 Referring Provider (PT): Tracie Harrier, MD   Encounter Date: 02/22/2020   PT End of Session - 02/22/20 1014    Visit Number 2    Number of Visits 16    Date for PT Re-Evaluation 04/20/20    Authorization Type UHC    PT Start Time 2297    PT Stop Time 1019    PT Time Calculation (min) 44 min    Activity Tolerance Patient limited by pain    Behavior During Therapy [facility] for tasks assessed/performed           Past Medical History:  Diagnosis Date  . Hypertension     Past Surgical History:  Procedure Laterality Date  . CESAREAN SECTION    . ENDOMETRIAL ABLATION    . FOOT SURGERY Left   . KNEE SURGERY Left   . LAPAROTOMY N/A 11/26/2017   Procedure: EXPLORATORY LAPAROTOMY;  Surgeon: [name], MD;  Location: [facility];  Service: Gynecology;  Laterality: N/A;  . UMBILICAL HERNIA REPAIR N/A 11/26/2017   Procedure: HERNIA REPAIR UMBILICAL ADULT;  Surgeon: [name], MD;  Location: [facility];  Service: Gynecology;  Laterality: N/A;  . UNILATERAL SALPINGECTOMY Right 11/26/2017   Procedure: RIGHT SALPINGO-OOPHORECTOMY;  Surgeon: [name], MD;  Location: [facility];  Service: Gynecology;  Laterality: Right;    There were no vitals filed for this visit.   Subjective Assessment - 02/22/20 0942    Subjective Pt reports she can tell the difference in her Rt knee, "it feels better".  She was "so stinking sore" after the first session.    Currently in Pain? Yes    Pain Score 2     Pain Location Knee    Pain Orientation Right    Pain Descriptors / Indicators Sore    Pain Type Surgical pain              [facility] PT Assessment - 02/22/20 0001      Assessment   Medical Diagnosis R TKR    Referring Provider (PT)  Tracie Harrier, MD    Onset Date/Surgical Date 01/31/20    Hand Dominance Right    Next MD Visit 03/08/20      PROM   Right Knee Flexion 60   seated scoot             [facility] Adult PT Treatment/Exercise - 02/22/20 0001      Knee/Hip Exercises: Stretches   Other Knee/Hip Stretches forward lunge with Rt foot on 4" and 8" step for increase in Rt knee flexion, followed by 2 reps Rt hamstring stretch with foot on 8" step and overpressure from pt above knee x 30 sec       Knee/Hip Exercises: Aerobic   Nustep 8 min for ROM, encouragement throughout.     therapist present to discuss progress     Knee/Hip Exercises: Standing   Forward Step Up Right;1 set;10 reps;Hand Hold: 2;Step Height: 4"   and retro step down   Other Standing Knee Exercises staggered stance and knee flex to ext for encouraged knee bend during swing through phase of gait.     Other Standing Knee Exercises side to side weight shifts to encourage increased WB in stance phase.  Knee/Hip Exercises: Seated   Long Arc Quad Strengthening;Right;1 set;10 reps    Other Seated Knee/Hip Exercises seated scoot x 5 sec x 5 reps     Marching 1 set;5 reps    Sit to Sand 1 set;5 reps;without UE support   to high NuSTep seat     Modalities   Modalities --   MHP on belly, and blanket for comfort.      Vasopneumatic   Number Minutes Vasopneumatic  10 minutes    Vasopnuematic Location  Knee    Vasopneumatic Pressure Low    Vasopneumatic Temperature  34                    PT Short Term Goals - 02/20/20 2128      PT SHORT TERM GOAL #1   Title The patient will be indep with initial HEP.    Time 4    Period Weeks    Target Date 03/21/20      PT SHORT TERM GOAL #2   Title The patient will improve R knee flexion to 80 degrees A/ROM    Baseline A/AROM 48 deg    Time 4    Period Weeks    Target Date 03/21/20      PT SHORT TERM GOAL #3   Title The patient will improve AROM R knee to -9 degrees extension.     Baseline -15    Time 4    Period Weeks    Target Date 03/21/20      PT SHORT TERM GOAL #4   Title The patient will ambulate household distances without a device and improved knee flexion during gait cycle.    Time 4    Period Weeks    Target Date 03/21/20             PT Long Term Goals - 02/20/20 2115      PT LONG TERM GOAL #1   Title The patient will be indep with HEP.    Time 8    Period Weeks    Target Date 04/20/20      PT LONG TERM GOAL #2   Title The patient will reduce limitation per FOTO to < or equal to 24% limitation.    Time 8    Period Weeks    Target Date 04/20/20      PT LONG TERM GOAL #3   Title The patient will improve A/ROM R knee to 5 to 105 degrees.    Time 8    Period Weeks    Target Date 04/20/20      PT LONG TERM GOAL #4   Title The patient will negotiate community surfaces including stairs, curbs, unlevel ground without a device independently x500 ft.    Time 8    Period Weeks    Target Date 04/20/20      PT LONG TERM GOAL #5   Title The patient will report R knee pain < or equal to 2/10.    Baseline 6/10 today    Time 8    Period Weeks    Target Date 04/20/20                 Plan - 02/22/20 1012    Clinical Impression Statement Gradual improvement in Rt knee flexion ROM.  Encouragement given throughout due to increased pain with ROM. Pain reduced with rest and vaso at end of session.  Goals are ongoing.    Personal Factors and Comorbidities  Comorbidity 2    Comorbidities HTN, h/o migraines    Examination-Activity Limitations Locomotion Level;Squat;Stairs;Stand    Examination-Participation Restrictions Community Activity;Driving;Cleaning    Stability/Clinical Decision Making Stable/Uncomplicated    Rehab Potential Good    PT Frequency 2x / week    PT Duration 8 weeks    PT Treatment/Interventions ADLs/Self Care Home Management;Vasopneumatic Device;Taping;Manual techniques;Therapeutic activities;Therapeutic exercise;Functional  mobility training;Gait training;Stair training;Patient/family education;Electrical Stimulation;Moist Heat;Cryotherapy    PT Next Visit Plan Emphasize A/ROM, work on Animal nutritionist to increase WS and encourage knee flexion during swing phase, progress quad initiation/strength    PT Home Exercise Plan Access Code: [email or web address]    Consulted and Agree with Plan of Care Patient           Patient will benefit from skilled therapeutic intervention in order to improve the following deficits and impairments:  Pain, Decreased range of motion, Abnormal gait, Decreased strength, Increased edema, Hypomobility, Impaired flexibility, Increased fascial restricitons  Visit Diagnosis: Acute pain of right knee  Muscle weakness (generalized)  Other abnormalities of gait and mobility     Problem List Patient Active Problem List   Diagnosis Date Noted  . Need for hepatitis C screening test 01/24/2020  . Hyperglycemia 01/24/2020  . Ovarian cyst 11/26/2017  . Obesity (BMI 35.0-39.9 without comorbidity) 07/14/2017  . Multinodular goiter 07/17/2016  . Migraine 06/30/2016  . Plantar fasciitis of left foot 09/16/2013  . LBP (low back pain) 09/05/2013  . Hypertension 04/05/2013  . Vaginitis and vulvovaginitis 03/15/2013  . Hydradenitis 08/20/2011   Kerin Perna, PTA 02/22/20 5:12 PM [place], [place], [address] Phone: [phone]   Fax:  [phone]  Name: Amber Reilly MRN: [id number] Date of Birth: 1965/05/14

## 2020-02-27 ENCOUNTER — Other Ambulatory Visit: Payer: Self-pay

## 2020-02-27 ENCOUNTER — Ambulatory Visit (INDEPENDENT_AMBULATORY_CARE_PROVIDER_SITE_OTHER): Payer: 59 | Admitting: Physical Therapy

## 2020-02-27 DIAGNOSIS — R2689 Other abnormalities of gait and mobility: Secondary | ICD-10-CM

## 2020-02-27 DIAGNOSIS — M25561 Pain in right knee: Secondary | ICD-10-CM

## 2020-02-27 DIAGNOSIS — M6281 Muscle weakness (generalized): Secondary | ICD-10-CM | POA: Diagnosis not present

## 2020-02-27 NOTE — Therapy (Signed)
[name], [place], [address] Phone: [phone]   Fax:  [phone]  Physical Therapy Treatment  Patient Details  Name: Reilly Reilly MRN: [id number] Date of Birth: [birth year] Referring Provider (PT): Tracie Harrier, MD   Encounter Date: 02/27/2020   PT End of Session - 02/27/20 1526    Visit Number 3    Number of Visits 16    Date for PT Re-Evaluation 04/20/20    Authorization Type UHC    PT Start Time 4709    PT Stop Time 1600    PT Time Calculation (min) 43 min    Activity Tolerance Patient limited by pain    Behavior During Therapy [facility] for tasks assessed/performed           Past Medical History:  Diagnosis Date  . Hypertension     Past Surgical History:  Procedure Laterality Date  . CESAREAN SECTION    . ENDOMETRIAL ABLATION    . FOOT SURGERY Left   . KNEE SURGERY Left   . LAPAROTOMY N/A 11/26/2017   Procedure: EXPLORATORY LAPAROTOMY;  Surgeon: Reilly Amber, MD;  Location: [facility];  Service: Gynecology;  Laterality: N/A;  . UMBILICAL HERNIA REPAIR N/A 11/26/2017   Procedure: HERNIA REPAIR UMBILICAL ADULT;  Surgeon: Reilly Amber, MD;  Location: [facility];  Service: Gynecology;  Laterality: N/A;  . UNILATERAL SALPINGECTOMY Right 11/26/2017   Procedure: RIGHT SALPINGO-OOPHORECTOMY;  Surgeon: Reilly Amber, MD;  Location: [facility];  Service: Gynecology;  Laterality: Right;    There were no vitals filed for this visit.   Subjective Assessment - 02/27/20 1527    Subjective Pt reports she has pain in Rt hamstring with riding in car.  She has been getting up from her work station often, "but I don't get up enough".   She is having difficulty getting onto toilet since it is low.    Currently in Pain? No/denies    Pain Score 0-No pain              [facility] PT Assessment - 02/27/20 0001      Assessment   Medical Diagnosis R TKR    Referring Provider (PT) Tracie Harrier, MD     Onset Date/Surgical Date 01/31/20    Hand Dominance Right    Next MD Visit 03/08/20      PROM   Right Knee Extension -12    Right Knee Flexion 62   standing lunge     Flexibility   Soft Tissue Assessment /Muscle Length yes    Quadriceps Rt knee 44 deg (prone quad stretch)            [facility] Adult PT Treatment/Exercise - 02/27/20 0001      Knee/Hip Exercises: Stretches   Passive Hamstring Stretch Right;1 rep;30 seconds   standing, foot on 12" step, overpressure from pt.    Other Knee/Hip Stretches forward lunge with Rt foot on 12" step for increased Rt knee flexion x 8 reps      Knee/Hip Exercises: Aerobic   Nustep L3: legs only for ROM x 7 min       Knee/Hip Exercises: Standing   Other Standing Knee Exercises side to side, and front to back weight shifts to encourage increased WB in stance phase.       Knee/Hip Exercises: Seated   Knee/Hip Flexion 4 reps of sit to stand from blue chair with cues for knee flexion, and wt shift  to Rt.     Other Seated Knee/Hip Exercises seated scoot x 5 sec x 5 reps     Sit to Sand 1 set;5 reps;without UE support   to high NuSTep seat     Knee/Hip Exercises: Supine   Quad Sets Strengthening;Right;5 reps   long sitting for visual cueing   Quad Sets Limitations poor quad contraction    Short Arc Quad Sets Strengthening;Right;1 set;5 reps      Knee/Hip Exercises: Prone   Other Prone Exercises prone hang with foot off of mat x 1 min       Modalities   Modalities --   pt declined.      Manual Therapy   Manual Therapy Soft tissue mobilization    Soft tissue mobilization gentle STM to Rt hamstring (point tender/ limited tolerance)                    PT Short Term Goals - 02/20/20 2128      PT SHORT TERM GOAL #1   Title The patient will be indep with initial HEP.    Time 4    Period Weeks    Target Date 03/21/20      PT SHORT TERM GOAL #2   Title The patient will improve R knee flexion to 80 degrees A/ROM    Baseline A/AROM  48 deg    Time 4    Period Weeks    Target Date 03/21/20      PT SHORT TERM GOAL #3   Title The patient will improve AROM R knee to -9 degrees extension.    Baseline -15    Time 4    Period Weeks    Target Date 03/21/20      PT SHORT TERM GOAL #4   Title The patient will ambulate household distances without a device and improved knee flexion during gait cycle.    Time 4    Period Weeks    Target Date 03/21/20             PT Long Term Goals - 02/20/20 2115      PT LONG TERM GOAL #1   Title The patient will be indep with HEP.    Time 8    Period Weeks    Target Date 04/20/20      PT LONG TERM GOAL #2   Title The patient will reduce limitation per FOTO to < or equal to 24% limitation.    Time 8    Period Weeks    Target Date 04/20/20      PT LONG TERM GOAL #3   Title The patient will improve A/ROM R knee to 5 to 105 degrees.    Time 8    Period Weeks    Target Date 04/20/20      PT LONG TERM GOAL #4   Title The patient will negotiate community surfaces including stairs, curbs, unlevel ground without a device independently x500 ft.    Time 8    Period Weeks    Target Date 04/20/20      PT LONG TERM GOAL #5   Title The patient will report R knee pain < or equal to 2/10.    Baseline 6/10 today    Time 8    Period Weeks    Target Date 04/20/20                 Plan - 02/27/20 1655    Clinical Impression Statement  Pt continues to have limited tolerance for ROM exercise for Rt knee. Pt participates well throughout session, reporting pain up 5/10 during exercises.  Pt declined modalities as she reports she will ice at home. Goals are ongoing.    Personal Factors and Comorbidities Comorbidity 2    Comorbidities HTN, h/o migraines    Examination-Activity Limitations Locomotion Level;Squat;Stairs;Stand    Examination-Participation Restrictions Community Activity;Driving;Cleaning    Stability/Clinical Decision Making Stable/Uncomplicated    Rehab Potential  Good    PT Frequency 2x / week    PT Duration 8 weeks    PT Treatment/Interventions ADLs/Self Care Home Management;Vasopneumatic Device;Taping;Manual techniques;Therapeutic activities;Therapeutic exercise;Functional mobility training;Gait training;Stair training;Patient/family education;Electrical Stimulation;Moist Heat;Cryotherapy    PT Next Visit Plan emphasize ROM for Rt knee, gait mechanics with flexion during swing phase.    PT Home Exercise Plan Access Code: [email or web address]    Consulted and Agree with Plan of Care Patient           Patient will benefit from skilled therapeutic intervention in order to improve the following deficits and impairments:  Pain, Decreased range of motion, Abnormal gait, Decreased strength, Increased edema, Hypomobility, Impaired flexibility, Increased fascial restricitons  Visit Diagnosis: Acute pain of right knee  Muscle weakness (generalized)  Other abnormalities of gait and mobility     Problem List Patient Active Problem List   Diagnosis Date Noted  . Need for hepatitis C screening test 01/24/2020  . Hyperglycemia 01/24/2020  . Ovarian cyst 11/26/2017  . Obesity (BMI 35.0-39.9 without comorbidity) 07/14/2017  . Multinodular goiter 07/17/2016  . Migraine 06/30/2016  . Plantar fasciitis of left foot 09/16/2013  . LBP (low back pain) 09/05/2013  . Hypertension 04/05/2013  . Vaginitis and vulvovaginitis 03/15/2013  . Hydradenitis 08/20/2011   Kerin Perna, PTA 02/27/20 5:01 PM  [place], [place], [address] Phone: [phone]   Fax:  [phone]  Name: KELLYANNE ELLWANGER MRN: [id number] Date of Birth: 1965/09/05

## 2020-03-01 ENCOUNTER — Ambulatory Visit (INDEPENDENT_AMBULATORY_CARE_PROVIDER_SITE_OTHER): Payer: 59 | Admitting: Physical Therapy

## 2020-03-01 ENCOUNTER — Other Ambulatory Visit: Payer: Self-pay

## 2020-03-01 DIAGNOSIS — M25561 Pain in right knee: Secondary | ICD-10-CM

## 2020-03-01 DIAGNOSIS — R2689 Other abnormalities of gait and mobility: Secondary | ICD-10-CM | POA: Diagnosis not present

## 2020-03-01 DIAGNOSIS — M6281 Muscle weakness (generalized): Secondary | ICD-10-CM

## 2020-03-01 NOTE — Therapy (Signed)
[place], [place], [address] Phone: [phone]   Fax:  [phone]  Physical Therapy Treatment  Patient Details  Name: Amber Reilly MRN: [id number] Date of Birth: 12-08-1965 Referring Provider (PT): Tracie Harrier, MD   Encounter Date: 03/01/2020   PT End of Session - 03/01/20 1537    Visit Number 4    Number of Visits 16    Date for PT Re-Evaluation 04/20/20    Authorization Type UHC    PT Start Time 4287    PT Stop Time 1616   MHP/CP last 10 min   PT Time Calculation (min) 45 min    Activity Tolerance Patient limited by pain    Behavior During Therapy [facility] for tasks assessed/performed           Past Medical History:  Diagnosis Date  . Hypertension     Past Surgical History:  Procedure Laterality Date  . CESAREAN SECTION    . ENDOMETRIAL ABLATION    . FOOT SURGERY Left   . KNEE SURGERY Left   . LAPAROTOMY N/A 11/26/2017   Procedure: EXPLORATORY LAPAROTOMY;  Surgeon: [name], MD;  Location: [facility];  Service: Gynecology;  Laterality: N/A;  . UMBILICAL HERNIA REPAIR N/A 11/26/2017   Procedure: HERNIA REPAIR UMBILICAL ADULT;  Surgeon: [name], MD;  Location: [facility];  Service: Gynecology;  Laterality: N/A;  . UNILATERAL SALPINGECTOMY Right 11/26/2017   Procedure: RIGHT SALPINGO-OOPHORECTOMY;  Surgeon: [name], MD;  Location: [facility];  Service: Gynecology;  Laterality: Right;    There were no vitals filed for this visit.   Subjective Assessment - 03/01/20 1537    Subjective Pt reports she has been stretching her Rt knee.  "I feel like I can walk better".  She reports she has been working on going up steps leading with RLE.  She complains of difficulty sleeping.    Patient Stated Goals Have mobility back and improve mobility.    Currently in Pain? Yes    Pain Score 2               [facility] PT Assessment - 03/01/20 0001      Assessment   Medical Diagnosis R TKR     Referring Provider (PT) Tracie Harrier, MD    Onset Date/Surgical Date 01/31/20    Hand Dominance Right    Next MD Visit 03/08/20            [facility] Adult PT Treatment/Exercise - 03/01/20 0001      Knee/Hip Exercises: Stretches   Passive Hamstring Stretch Right;3 reps;20 seconds   supine with strap   Quad Stretch Right;2 reps;30 seconds   prone with strap   Other Knee/Hip Stretches forward lunge with Rt foot on 12" step for increased Rt knee flexion x 4 reps of 20 sec       Knee/Hip Exercises: Aerobic   Recumbent Bike partial revolutions x 5 min for ROM      Knee/Hip Exercises: Standing   Forward Step Up Right;2 sets;Hand Hold: 2;Step Height: 4";Step Height: 6"   3 reps on 4", 3 reps on 6"    Forward Step Up Limitations requires increased time to complete     Step Down Left;Hand Hold: 2;Step Height: 4"   3 stairs.      Knee/Hip Exercises: Seated   Long Arc Quad Strengthening;Right;1 set;10 reps   3 sec hold in ext   Other Seated Knee/Hip  Exercises seated scoot x 10 sec x 5 reps     Marching --    Sit to [employer] --      Knee/Hip Exercises: Sidelying   Hip ABduction Strengthening;Right;1 set;10 reps      Knee/Hip Exercises: Prone   Other Prone Exercises Rt TKE with toes tucked x 3 sec x 10 reps      Modalities   Modalities Cryotherapy;Moist Heat      Moist Heat Therapy   Number Minutes Moist Heat 10 Minutes    Moist Heat Location Knee   post Rt     Cryotherapy   Number Minutes Cryotherapy 10 Minutes    Cryotherapy Location Knee    Type of Cryotherapy Ice pack   ant Rt                   PT Short Term Goals - 02/20/20 2128      PT SHORT TERM GOAL #1   Title The patient will be indep with initial HEP.    Time 4    Period Weeks    Target Date 03/21/20      PT SHORT TERM GOAL #2   Title The patient will improve R knee flexion to 80 degrees A/ROM    Baseline A/AROM 48 deg    Time 4    Period Weeks    Target Date 03/21/20      PT SHORT TERM GOAL  #3   Title The patient will improve AROM R knee to -9 degrees extension.    Baseline -15    Time 4    Period Weeks    Target Date 03/21/20      PT SHORT TERM GOAL #4   Title The patient will ambulate household distances without a device and improved knee flexion during gait cycle.    Time 4    Period Weeks    Target Date 03/21/20             PT Long Term Goals - 02/20/20 2115      PT LONG TERM GOAL #1   Title The patient will be indep with HEP.    Time 8    Period Weeks    Target Date 04/20/20      PT LONG TERM GOAL #2   Title The patient will reduce limitation per FOTO to < or equal to 24% limitation.    Time 8    Period Weeks    Target Date 04/20/20      PT LONG TERM GOAL #3   Title The patient will improve A/ROM R knee to 5 to 105 degrees.    Time 8    Period Weeks    Target Date 04/20/20      PT LONG TERM GOAL #4   Title The patient will negotiate community surfaces including stairs, curbs, unlevel ground without a device independently x500 ft.    Time 8    Period Weeks    Target Date 04/20/20      PT LONG TERM GOAL #5   Title The patient will report R knee pain < or equal to 2/10.    Baseline 6/10 today    Time 8    Period Weeks    Target Date 04/20/20                 Plan - 03/01/20 1602    Clinical Impression Statement Rt knee flexion to 60 deg ROM today, but quad flexibility improved to  49 deg.  Pt reporting pain in Rt lateral knee up to 6/10 with exercises; reduced with rest.  Pt remains motiviated to progress towards goals. Pt making gradual progress towards goals.    Personal Factors and Comorbidities Comorbidity 2    Comorbidities HTN, h/o migraines    Examination-Activity Limitations Locomotion Level;Squat;Stairs;Stand    Examination-Participation Restrictions Community Activity;Driving;Cleaning    Stability/Clinical Decision Making Stable/Uncomplicated    Rehab Potential Good    PT Frequency 2x / week    PT Duration 8 weeks    PT  Treatment/Interventions ADLs/Self Care Home Management;Vasopneumatic Device;Taping;Manual techniques;Therapeutic activities;Therapeutic exercise;Functional mobility training;Gait training;Stair training;Patient/family education;Electrical Stimulation;Moist Heat;Cryotherapy    PT Next Visit Plan emphasize ROM for Rt knee, gait mechanics with flexion during swing phase.    PT Home Exercise Plan Access Code: [email or web address]    Consulted and Agree with Plan of Care Patient           Patient will benefit from skilled therapeutic intervention in order to improve the following deficits and impairments:  Pain, Decreased range of motion, Abnormal gait, Decreased strength, Increased edema, Hypomobility, Impaired flexibility, Increased fascial restricitons  Visit Diagnosis: Acute pain of right knee  Muscle weakness (generalized)  Other abnormalities of gait and mobility     Problem List Patient Active Problem List   Diagnosis Date Noted  . Need for hepatitis C screening test 01/24/2020  . Hyperglycemia 01/24/2020  . Ovarian cyst 11/26/2017  . Obesity (BMI 35.0-39.9 without comorbidity) 07/14/2017  . Multinodular goiter 07/17/2016  . Migraine 06/30/2016  . Plantar fasciitis of left foot 09/16/2013  . LBP (low back pain) 09/05/2013  . Hypertension 04/05/2013  . Vaginitis and vulvovaginitis 03/15/2013  . Hydradenitis 08/20/2011   Kerin Perna, PTA 03/01/20 [facility] [place] [employer] [place], [place], [address] Phone: [phone]   Fax:  [phone]  Name: Amber Reilly MRN: [id number] Date of Birth: [birth year]

## 2020-03-07 ENCOUNTER — Encounter: Payer: Self-pay | Admitting: Physical Therapy

## 2020-03-07 ENCOUNTER — Other Ambulatory Visit: Payer: Self-pay

## 2020-03-07 ENCOUNTER — Ambulatory Visit (INDEPENDENT_AMBULATORY_CARE_PROVIDER_SITE_OTHER): Payer: 59 | Admitting: Physical Therapy

## 2020-03-07 DIAGNOSIS — M6281 Muscle weakness (generalized): Secondary | ICD-10-CM

## 2020-03-07 DIAGNOSIS — M25561 Pain in right knee: Secondary | ICD-10-CM | POA: Diagnosis not present

## 2020-03-07 DIAGNOSIS — R2689 Other abnormalities of gait and mobility: Secondary | ICD-10-CM

## 2020-03-07 NOTE — Therapy (Signed)
[place], [place], [address] Phone: [phone]   Fax:  [phone]  Physical Therapy Treatment  Patient Details  Name: Amber Reilly MRN: [id number] Date of Birth: [birth year] Referring Provider (PT): Tracie Harrier, MD   Encounter Date: 03/07/2020   PT End of Session - 03/07/20 1527    Visit Number 5    Number of Visits 16    Date for PT Re-Evaluation 04/20/20    Authorization Type UHC    PT Start Time 1520    PT Stop Time 1601    PT Time Calculation (min) 41 min    Activity Tolerance Patient limited by pain    Behavior During Therapy [facility] for tasks assessed/performed           Past Medical History:  Diagnosis Date  . Hypertension     Past Surgical History:  Procedure Laterality Date  . CESAREAN SECTION    . ENDOMETRIAL ABLATION    . FOOT SURGERY Left   . KNEE SURGERY Left   . LAPAROTOMY N/A 11/26/2017   Procedure: EXPLORATORY LAPAROTOMY;  Surgeon: [name], MD;  Location: [facility];  Service: Gynecology;  Laterality: N/A;  . UMBILICAL HERNIA REPAIR N/A 11/26/2017   Procedure: HERNIA REPAIR UMBILICAL ADULT;  Surgeon: [name], MD;  Location: [facility];  Service: Gynecology;  Laterality: N/A;  . UNILATERAL SALPINGECTOMY Right 11/26/2017   Procedure: RIGHT SALPINGO-OOPHORECTOMY;  Surgeon: [name], MD;  Location: [facility];  Service: Gynecology;  Laterality: Right;    There were no vitals filed for this visit.   Subjective Assessment - 03/07/20 1524    Subjective "I think I was doing really well until I come in here".  Pt reports she has been working on her exercises at home. Continued complaint of knee stiffness.  Continued difficulty sleeping, but improving.    Patient Stated Goals Have mobility back and improve mobility.    Currently in Pain? No/denies    Pain Score 0-No pain              [facility] PT Assessment - 03/07/20 0001      Assessment   Medical Diagnosis R  TKR    Referring Provider (PT) Tracie Harrier, MD    Onset Date/Surgical Date 01/31/20    Hand Dominance Right    Next MD Visit 03/08/20      PROM   Right/Left Knee Right    Right Knee Extension -13    Right Knee Flexion 65            [facility] Adult PT Treatment/Exercise - 03/07/20 0001      Knee/Hip Exercises: Stretches   Passive Hamstring Stretch Right;2 reps;30 seconds    Quad Stretch Right;2 reps   prone with strap, 45 sec    Knee: Self-Stretch to increase Flexion Right;3 reps;20 seconds;2 reps;10 seconds   foot on wall, LLE pulling leg down.  Standing forward lunge   Other Knee/Hip Stretches seated on elevated mat, gravity assist into flexion, then AAROM with use of LLE x 10 sec x 3 reps.  PROM into Rt knee flexion (contract 5 sec, then passive stretch x 20 sec x 5 reps) with estim to assist with tolerance of stretch .     Other Knee/Hip Stretches PROM into Rt knee ext x 20 sec x 3 reps       Knee/Hip Exercises: Aerobic   Recumbent Bike partial revolutions x 7 min for  ROM      Modalities   Modalities Teacher, English as a foreign language Location Rt ant knee    Electrical Stimulation Action IFC    Electrical Stimulation Parameters intensity to tolerance; during PROM     Electrical Stimulation Goals Pain                    PT Short Term Goals - 02/20/20 2128      PT SHORT TERM GOAL #1   Title The patient will be indep with initial HEP.    Time 4    Period Weeks    Target Date 03/21/20      PT SHORT TERM GOAL #2   Title The patient will improve R knee flexion to 80 degrees A/ROM    Baseline A/AROM 50 deg    Time 4    Period Weeks    Target Date 03/21/20      PT SHORT TERM GOAL #3   Title The patient will improve AROM R knee to -9 degrees extension.    Baseline -15    Time 4    Period Weeks    Target Date 03/21/20      PT SHORT TERM GOAL #4   Title The patient will ambulate household distances without a  device and improved knee flexion during gait cycle.    Time 4    Period Weeks    Target Date 03/21/20             PT Long Term Goals - 03/07/20 1702      PT LONG TERM GOAL #1   Title The patient will be indep with HEP.    Time 8    Period Weeks    Status On-going      PT LONG TERM GOAL #2   Title The patient will reduce limitation per FOTO to < or equal to 24% limitation.    Baseline 41% limitation    Time 8    Period Weeks    Status On-going      PT LONG TERM GOAL #3   Title The patient will improve A/ROM R knee to 5 to 105 degrees.    Baseline 13-65 flexion (PROM)    Time 8    Period Weeks    Status On-going      PT LONG TERM GOAL #4   Title The patient will negotiate community surfaces including stairs, curbs, unlevel ground without a device independently x500 ft.    Time 8    Period Weeks    Status On-going      PT LONG TERM GOAL #5   Title The patient will report R knee pain < or equal to 2/10.    Baseline 6/10 today    Time 8    Period Weeks    Status On-going                 Plan - 03/07/20 1705    Clinical Impression Statement Pt continues with very limited Rt knee ROM (13-65 deg flexion).  Pain is limiting factor with AAROM/PROM attempts.  Trial of estim during PROM; limited benefit.  Pt continues to have limited ability to ascend/descend steps due to decreased ROM.  Pt remains motivated to progress.  Goals are ongoing.    Personal Factors and Comorbidities Comorbidity 2    Comorbidities HTN, h/o migraines    Examination-Activity Limitations Locomotion Level;Squat;Stairs;Stand    Examination-Participation Restrictions Community  Activity;Driving;Cleaning    Stability/Clinical Decision Making Stable/Uncomplicated    Rehab Potential Good    PT Frequency 2x / week    PT Duration 8 weeks    PT Treatment/Interventions ADLs/Self Care Home Management;Vasopneumatic Device;Taping;Manual techniques;Therapeutic activities;Therapeutic exercise;Functional  mobility training;Gait training;Stair training;Patient/family education;Electrical Stimulation;Moist Heat;Cryotherapy    PT Next Visit Plan emphasize ROM for Rt knee, gait mechanics with flexion during swing phase.    PT Home Exercise Plan Access Code: [email or web address]    Consulted and Agree with Plan of Care Patient           Patient will benefit from skilled therapeutic intervention in order to improve the following deficits and impairments:  Pain, Decreased range of motion, Abnormal gait, Decreased strength, Increased edema, Hypomobility, Impaired flexibility, Increased fascial restricitons  Visit Diagnosis: Acute pain of right knee  Muscle weakness (generalized)  Other abnormalities of gait and mobility     Problem List Patient Active Problem List   Diagnosis Date Noted  . Need for hepatitis C screening test 01/24/2020  . Hyperglycemia 01/24/2020  . Ovarian cyst 11/26/2017  . Obesity (BMI 35.0-39.9 without comorbidity) 07/14/2017  . Multinodular goiter 07/17/2016  . Migraine 06/30/2016  . Plantar fasciitis of left foot 09/16/2013  . LBP (low back pain) 09/05/2013  . Hypertension 04/05/2013  . Vaginitis and vulvovaginitis 03/15/2013  . Hydradenitis 08/20/2011   Kerin Perna, PTA 03/07/20 5:16 PM  [place], [place], [address] Phone: [phone]   Fax:  [phone]  Name: Amber Reilly MRN: [id number] Date of Birth: 09-26-1965

## 2020-03-09 ENCOUNTER — Ambulatory Visit (INDEPENDENT_AMBULATORY_CARE_PROVIDER_SITE_OTHER): Payer: 59 | Admitting: Rehabilitative and Restorative Service Providers"

## 2020-03-09 ENCOUNTER — Other Ambulatory Visit: Payer: Self-pay

## 2020-03-09 ENCOUNTER — Encounter: Payer: Self-pay | Admitting: Rehabilitative and Restorative Service Providers"

## 2020-03-09 DIAGNOSIS — M25561 Pain in right knee: Secondary | ICD-10-CM

## 2020-03-09 DIAGNOSIS — R2689 Other abnormalities of gait and mobility: Secondary | ICD-10-CM

## 2020-03-09 DIAGNOSIS — M6281 Muscle weakness (generalized): Secondary | ICD-10-CM

## 2020-03-09 NOTE — Therapy (Signed)
[place], [place], [address] Phone: [phone]   Fax:  [phone]  Physical Therapy Treatment  Patient Details  Name: Amber Reilly MRN: [id number] Date of Birth: 08-May-1965 Referring Provider (PT): Tracie Harrier, MD   Encounter Date: 03/09/2020   PT End of Session - 03/09/20 1446    Visit Number 6    Number of Visits 16    Date for PT Re-Evaluation 04/20/20    Authorization Type UHC    PT Start Time 2706    PT Stop Time 1446    PT Time Calculation (min) 42 min    Activity Tolerance Patient limited by pain    Behavior During Therapy [facility] for tasks assessed/performed           Past Medical History:  Diagnosis Date  . Hypertension     Past Surgical History:  Procedure Laterality Date  . CESAREAN SECTION    . ENDOMETRIAL ABLATION    . FOOT SURGERY Left   . KNEE SURGERY Left   . LAPAROTOMY N/A 11/26/2017   Procedure: EXPLORATORY LAPAROTOMY;  Surgeon: [name], MD;  Location: [facility];  Service: Gynecology;  Laterality: N/A;  . UMBILICAL HERNIA REPAIR N/A 11/26/2017   Procedure: HERNIA REPAIR UMBILICAL ADULT;  Surgeon: [name], MD;  Location: [facility];  Service: Gynecology;  Laterality: N/A;  . UNILATERAL SALPINGECTOMY Right 11/26/2017   Procedure: RIGHT SALPINGO-OOPHORECTOMY;  Surgeon: [name], MD;  Location: [facility];  Service: Gynecology;  Laterality: Right;    There were no vitals filed for this visit.   Subjective Assessment - 03/09/20 1409    Subjective The patient saw MD this week.  He wants her to focus on flexing her knee more frequently t/o the day.  He sees her back in 3 weeks.    Pertinent History HTN, h/o migraines    Patient Stated Goals Have mobility back and improve mobility.    Currently in Pain? No/denies              [facility] PT Assessment - 03/09/20 1421      Assessment   Medical Diagnosis R TKR    Referring Provider (PT) Tracie Harrier, MD    Onset Date/Surgical Date 01/31/20    Hand Dominance Right                         [facility] Adult PT Treatment/Exercise - 03/09/20 1421      Ambulation/Gait   Ambulation/Gait Yes    Ambulation/Gait Assistance 6: Modified independent (Device/Increase time)    Ambulation Distance (Feet) 160 Feet    Assistive device Straight cane    Gait Comments cues for knee flexion to initiate swing phase of gait      Exercises   Exercises Knee/Hip      Knee/Hip Exercises: Stretches   Active Hamstring Stretch 2 reps;30 seconds;Right    Active Hamstring Stretch Limitations prone knee hand with STM passive overpressure    Passive Hamstring Stretch Right;1 rep;30 seconds    Quad Stretch Right;5 reps;30 seconds    Other Knee/Hip Stretches supine knee to chest an dseated AAROM flexion      Knee/Hip Exercises: Aerobic   Nustep L3 x UE/LEs working on increasing knee flexion      Knee/Hip Exercises: Seated   Heel Slides AAROM;Right;5 reps    Heel Slides Limitations using fitter to try to increase flexion  Other Seated Knee/Hip Exercises seated knee flexion; hold foot in place and scoot anteriorly with hips to increase knee flexion      Knee/Hip Exercises: Supine   Quad Sets Strengthening;Right;5 reps      Manual Therapy   Manual Therapy Soft tissue mobilization    Manual therapy comments to improve knee A/ROM    Soft tissue mobilization patellar and scar tissue mobilization                  PT Education - 03/09/20 1445    Education Details HEP    Person(s) Educated Patient    Methods Explanation;Demonstration;Handout    Comprehension Verbalized understanding;Returned demonstration            PT Short Term Goals - 02/20/20 2128      PT SHORT TERM GOAL #1   Title The patient will be indep with initial HEP.    Time 4    Period Weeks    Target Date 03/21/20      PT SHORT TERM GOAL #2   Title The patient will improve R knee flexion to 80 degrees A/ROM     Baseline A/AROM 48 deg    Time 4    Period Weeks    Target Date 03/21/20      PT SHORT TERM GOAL #3   Title The patient will improve AROM R knee to -9 degrees extension.    Baseline -15    Time 4    Period Weeks    Target Date 03/21/20      PT SHORT TERM GOAL #4   Title The patient will ambulate household distances without a device and improved knee flexion during gait cycle.    Time 4    Period Weeks    Target Date 03/21/20             PT Long Term Goals - 03/07/20 1702      PT LONG TERM GOAL #1   Title The patient will be indep with HEP.    Time 8    Period Weeks    Status On-going      PT LONG TERM GOAL #2   Title The patient will reduce limitation per FOTO to < or equal to 24% limitation.    Baseline 41% limitation    Time 8    Period Weeks    Status On-going      PT LONG TERM GOAL #3   Title The patient will improve A/ROM R knee to 5 to 105 degrees.    Baseline 13-65 flexion (PROM)    Time 8    Period Weeks    Status On-going      PT LONG TERM GOAL #4   Title The patient will negotiate community surfaces including stairs, curbs, unlevel ground without a device independently x500 ft.    Time 8    Period Weeks    Status On-going      PT LONG TERM GOAL #5   Title The patient will report R knee pain < or equal to 2/10.    Baseline 6/10 today    Time 8    Period Weeks    Status On-going                 Plan - 03/09/20 1500    Clinical Impression Statement The patient reports she had 3-4 weeks to improve ROM or physician/surgeon may manipulate the knee under anesthesia.  PT added scar tissue and patellar mobs  to HEP.    PT Frequency 2x / week    PT Duration 8 weeks    PT Treatment/Interventions ADLs/Self Care Home Management;Vasopneumatic Device;Taping;Manual techniques;Therapeutic activities;Therapeutic exercise;Functional mobility training;Gait training;Stair training;Patient/family education;Electrical Stimulation;Moist Heat;Cryotherapy     PT Next Visit Plan emphasize ROM for Rt knee, gait mechanics with flexion during swing phase.    PT Home Exercise Plan Access Code: [email or web address]    Consulted and Agree with Plan of Care Patient           Patient will benefit from skilled therapeutic intervention in order to improve the following deficits and impairments:  Pain,Decreased range of motion,Abnormal gait,Decreased strength,Increased edema,Hypomobility,Impaired flexibility,Increased fascial restricitons  Visit Diagnosis: Acute pain of right knee  Muscle weakness (generalized)  Other abnormalities of gait and mobility     Problem List Patient Active Problem List   Diagnosis Date Noted  . Need for hepatitis C screening test 01/24/2020  . Hyperglycemia 01/24/2020  . Ovarian cyst 11/26/2017  . Obesity (BMI 35.0-39.9 without comorbidity) 07/14/2017  . Multinodular goiter 07/17/2016  . Migraine 06/30/2016  . Plantar fasciitis of left foot 09/16/2013  . LBP (low back pain) 09/05/2013  . Hypertension 04/05/2013  . Vaginitis and vulvovaginitis 03/15/2013  . Hydradenitis 08/20/2011    Keller, PT 03/09/2020, 3:02 PM  [facility] [place], [place], [address] Phone: [phone]   Fax:  [phone]  Name: JERSEE WINIARSKI MRN: [id number] Date of Birth: [birth year]

## 2020-03-09 NOTE — Patient Instructions (Signed)
Access Code: [email or web address] URL: [email or web address] Date: 03/09/2020 Prepared by: Rudell Cobb  Exercises Prone Knee Extension Hang - 2 x daily - 7 x weekly - 1 sets - 1 reps - 2-5 minutes hold Quad Setting and Stretching - 2 x daily - 7 x weekly - 1 sets - 10 reps Supine Heel Slide with Strap - 2 x daily - 7 x weekly - 1 sets - 10 reps Seated Heel Slide - 2 x daily - 7 x weekly - 1 sets - 10 reps Seated Hamstring Stretch with Chair - 2 x daily - 7 x weekly - 1 sets - 10 reps Gastroc Stretch on Wall - 2 x daily - 7 x weekly - 1 sets - 3 reps - 30 seconds hold Long Sitting 4 Way Patellar Glide - 2 x daily - 7 x weekly - 1 sets - 10 reps  Patient Education Scar Massage

## 2020-03-12 ENCOUNTER — Ambulatory Visit (INDEPENDENT_AMBULATORY_CARE_PROVIDER_SITE_OTHER): Payer: 59 | Admitting: Physical Therapy

## 2020-03-12 ENCOUNTER — Other Ambulatory Visit: Payer: Self-pay

## 2020-03-12 ENCOUNTER — Encounter: Payer: Self-pay | Admitting: Physical Therapy

## 2020-03-12 DIAGNOSIS — M6281 Muscle weakness (generalized): Secondary | ICD-10-CM

## 2020-03-12 DIAGNOSIS — R2689 Other abnormalities of gait and mobility: Secondary | ICD-10-CM | POA: Diagnosis not present

## 2020-03-12 DIAGNOSIS — M25561 Pain in right knee: Secondary | ICD-10-CM | POA: Diagnosis not present

## 2020-03-12 NOTE — Therapy (Signed)
[place], [place], [address] Phone: [phone]   Fax:  [phone]  Physical Therapy Treatment  Patient Details  Name: Amber Reilly MRN: [id number] Date of Birth: 1965/07/27 Referring Provider (PT): Tracie Harrier, MD   Encounter Date: 03/12/2020   PT End of Session - 03/12/20 1353    Visit Number 7    Number of Visits 16    Date for PT Re-Evaluation 04/20/20    Authorization Type UHC    PT Start Time 0762    PT Stop Time 1444    PT Time Calculation (min) 51 min    Activity Tolerance Patient limited by pain    Behavior During Therapy [facility] for tasks assessed/performed           Past Medical History:  Diagnosis Date  . Hypertension     Past Surgical History:  Procedure Laterality Date  . CESAREAN SECTION    . ENDOMETRIAL ABLATION    . FOOT SURGERY Left   . KNEE SURGERY Left   . LAPAROTOMY N/A 11/26/2017   Procedure: EXPLORATORY LAPAROTOMY;  Surgeon: [name], MD;  Location: [facility];  Service: Gynecology;  Laterality: N/A;  . UMBILICAL HERNIA REPAIR N/A 11/26/2017   Procedure: HERNIA REPAIR UMBILICAL ADULT;  Surgeon: [name], MD;  Location: [facility];  Service: Gynecology;  Laterality: N/A;  . UNILATERAL SALPINGECTOMY Right 11/26/2017   Procedure: RIGHT SALPINGO-OOPHORECTOMY;  Surgeon: [name], MD;  Location: [facility];  Service: Gynecology;  Laterality: Right;    There were no vitals filed for this visit.   Subjective Assessment - 03/12/20 1357    Subjective Patient reporting no pain today.    Pertinent History HTN, h/o migraines    Patient Stated Goals Have mobility back and improve mobility.    Currently in Pain? No/denies                             [facility] Adult PT Treatment/Exercise - 03/12/20 0001      Ambulation/Gait   Ambulation/Gait Yes    Ambulation/Gait Assistance 6: Modified independent (Device/Increase time)    Ambulation Distance  (Feet) 80 Feet    Assistive device None    Gait Pattern Step-through pattern;Decreased hip/knee flexion - right;Decreased stride length;Right hip hike    Ambulation Surface Level    Gait Comments cues for knee flexion to initiate swing phase of gait; we also worked on exagerrated step length and increased speed      Knee/Hip Exercises: Aerobic   Stationary Bike L0 partial revolutions x 4 min    Nustep L2 x 4 min, seat 8 for knee flexion stretch      Knee/Hip Exercises: Seated   Heel Slides AAROM;Right;15 reps    Heel Slides Limitations using fitter to try to increase flexion with PT overpressure      Knee/Hip Exercises: Supine   Other Supine Knee/Hip Exercises knee flexion stretch with 5#, 7.5# and 10# over PT's arm followed by wall slide with 10# x 2 min      Modalities   Modalities Cryotherapy      Cryotherapy   Number Minutes Cryotherapy 10 Minutes    Cryotherapy Location Knee    Type of Cryotherapy Ice pack      Manual Therapy   Manual Therapy Passive ROM;Joint mobilization    Joint Mobilization for knee flex/ext; also patellar mobs med/lat and sup/inf (painful)  Passive ROM seated and supine into knee flexion                  PT Education - 03/12/20 1617    Education Details advised wall knee flexion stretch with weight (purse filled with cans e.g.)    Person(s) Educated Patient    Methods Explanation;Demonstration    Comprehension Verbalized understanding;Returned demonstration            PT Short Term Goals - 02/20/20 2128      PT SHORT TERM GOAL #1   Title The patient will be indep with initial HEP.    Time 4    Period Weeks    Target Date 03/21/20      PT SHORT TERM GOAL #2   Title The patient will improve R knee flexion to 80 degrees A/ROM    Baseline A/AROM 48 deg    Time 4    Period Weeks    Target Date 03/21/20      PT SHORT TERM GOAL #3   Title The patient will improve AROM R knee to -9 degrees extension.    Baseline -15    Time 4     Period Weeks    Target Date 03/21/20      PT SHORT TERM GOAL #4   Title The patient will ambulate household distances without a device and improved knee flexion during gait cycle.    Time 4    Period Weeks    Target Date 03/21/20             PT Long Term Goals - 03/07/20 1702      PT LONG TERM GOAL #1   Title The patient will be indep with HEP.    Time 8    Period Weeks    Status On-going      PT LONG TERM GOAL #2   Title The patient will reduce limitation per FOTO to < or equal to 24% limitation.    Baseline 41% limitation    Time 8    Period Weeks    Status On-going      PT LONG TERM GOAL #3   Title The patient will improve A/ROM R knee to 5 to 105 degrees.    Baseline 13-65 flexion (PROM)    Time 8    Period Weeks    Status On-going      PT LONG TERM GOAL #4   Title The patient will negotiate community surfaces including stairs, curbs, unlevel ground without a device independently x500 ft.    Time 8    Period Weeks    Status On-going      PT LONG TERM GOAL #5   Title The patient will report R knee pain < or equal to 2/10.    Baseline 6/10 today    Time 8    Period Weeks    Status On-going                 Plan - 03/12/20 1618    Clinical Impression Statement Patient continues to be very limited with flexion today measuring only 50 deg passively. PT stressed importance of low load long duration stretches to gain flexion. Elease is still walking with a SPC but was able to walk with improved gait pattern without her cane. PT advised only using it if she felt unsafe in a community situation. Her patella had decrease mobility at the start of treatment but loosened easily with mobs. LTGs are ongoing.  Personal Factors and Comorbidities Comorbidity 2    Comorbidities HTN, h/o migraines    Examination-Activity Limitations Locomotion Level;Squat;Stairs;Stand    PT Frequency 2x / week    PT Duration 8 weeks    PT Treatment/Interventions ADLs/Self Care  Home Management;Vasopneumatic Device;Taping;Manual techniques;Therapeutic activities;Therapeutic exercise;Functional mobility training;Gait training;Stair training;Patient/family education;Electrical Stimulation;Moist Heat;Cryotherapy    PT Next Visit Plan aggressive knee flexion ROM, continue working to normalize gait.    PT Home Exercise Plan Access Code: [email or web address]           Patient will benefit from skilled therapeutic intervention in order to improve the following deficits and impairments:  Pain,Decreased range of motion,Abnormal gait,Decreased strength,Increased edema,Hypomobility,Impaired flexibility,Increased fascial restricitons  Visit Diagnosis: Acute pain of right knee  Muscle weakness (generalized)  Other abnormalities of gait and mobility     Problem List Patient Active Problem List   Diagnosis Date Noted  . Need for hepatitis C screening test 01/24/2020  . Hyperglycemia 01/24/2020  . Ovarian cyst 11/26/2017  . Obesity (BMI 35.0-39.9 without comorbidity) 07/14/2017  . Multinodular goiter 07/17/2016  . Migraine 06/30/2016  . Plantar fasciitis of left foot 09/16/2013  . LBP (low back pain) 09/05/2013  . Hypertension 04/05/2013  . Vaginitis and vulvovaginitis 03/15/2013  . Hydradenitis 08/20/2011    Madelyn Flavors PT 03/12/2020, 4:32 PM  [facility] [place], [place], [address] Phone: [phone]   Fax:  [phone]  Name: ROSHANA SHUFFIELD MRN: [id number] Date of Birth: 1965-12-17

## 2020-03-14 ENCOUNTER — Other Ambulatory Visit: Payer: Self-pay

## 2020-03-14 ENCOUNTER — Ambulatory Visit (INDEPENDENT_AMBULATORY_CARE_PROVIDER_SITE_OTHER): Payer: 59 | Admitting: Physical Therapy

## 2020-03-14 DIAGNOSIS — M6281 Muscle weakness (generalized): Secondary | ICD-10-CM | POA: Diagnosis not present

## 2020-03-14 DIAGNOSIS — R2689 Other abnormalities of gait and mobility: Secondary | ICD-10-CM

## 2020-03-14 DIAGNOSIS — M25561 Pain in right knee: Secondary | ICD-10-CM

## 2020-03-14 NOTE — Therapy (Signed)
[place], [place], [address] Phone: [phone]   Fax:  [phone]  Physical Therapy Treatment  Patient Details  Name: Amber Reilly MRN: [id number] Date of Birth: 05/20/1965 Referring Provider (PT): Tracie Harrier, MD   Encounter Date: 03/14/2020   PT End of Session - 03/14/20 1525    Visit Number 8    Number of Visits 16    Date for PT Re-Evaluation 04/20/20    Authorization Type UHC    PT Start Time 1519    PT Stop Time 1604    PT Time Calculation (min) 45 min    Activity Tolerance Patient limited by pain    Behavior During Therapy [facility] for tasks assessed/performed           Past Medical History:  Diagnosis Date  . Hypertension     Past Surgical History:  Procedure Laterality Date  . CESAREAN SECTION    . ENDOMETRIAL ABLATION    . FOOT SURGERY Left   . KNEE SURGERY Left   . LAPAROTOMY N/A 11/26/2017   Procedure: EXPLORATORY LAPAROTOMY;  Surgeon: [name], MD;  Location: [facility];  Service: Gynecology;  Laterality: N/A;  . UMBILICAL HERNIA REPAIR N/A 11/26/2017   Procedure: HERNIA REPAIR UMBILICAL ADULT;  Surgeon: [name], MD;  [facility]: [facility];  Service: Gynecology;  Laterality: N/A;  . UNILATERAL SALPINGECTOMY Right 11/26/2017   Procedure: RIGHT SALPINGO-OOPHORECTOMY;  Surgeon: [name], MD;  Location: [facility];  Service: Gynecology;  Laterality: Right;    There were no vitals filed for this visit.   Subjective Assessment - 03/14/20 1545    Subjective "I got the best sleep that I have gotten yet last night".  Pt attributes it to wear sleeve over knee to bed.    Pertinent History HTN, h/o migraines    Patient Stated Goals Have mobility back and improve mobility.    Currently in Pain? No/denies    Pain Score 0-No pain              [facility] PT Assessment - 03/14/20 0001      Assessment   Medical Diagnosis R TKR    Referring Provider (PT) Tracie Harrier, MD    Onset Date/Surgical Date 01/31/20    Hand Dominance Right    Next MD Visit 04/07/19      PROM   Right Knee Flexion 60      Flexibility   Quadriceps Rt knee 49 deg           [facility] Adult PT Treatment/Exercise - 03/14/20 0001      Knee/Hip Exercises: Stretches   Passive Hamstring Stretch Right;1 rep;60 seconds   long sitting   Quad Stretch Right;3 reps;30 seconds   prone with strap   Knee: Self-Stretch to increase Flexion Right;3 reps;30 seconds   foot on 12" step   Gastroc Stretch Right;Left;1 rep;30 seconds      Knee/Hip Exercises: Aerobic   Stationary Bike L0 partial revolutions x 5.5 min    Other Aerobic single laps around gym in between exercises to decrease stiffness.      Knee/Hip Exercises: Standing   Forward Step Up Right;1 set;Hand Hold: 2;Step Height: 4"   6 steps with bilat rails.   Forward Step Up Limitations requires increased time to complete; tactile cues on lower leg to avoid compensating at ankle/hip.    SLS attempts at RLE SLS - able to hold ~2 sec without  UE support.    Other Standing Knee Exercises stand to sit, lowering down slowly for stretch to Rt knee into flexion - x 6 reps, with long holds in stretch. pt sitting to standard chair, holding onto sink.      Knee/Hip Exercises: Seated   Sit to Sand 5 reps;with UE support   cues to shift wt to Rt and bend knees.     Manual Therapy   Joint Mobilization for knee flex/ext; also patellar mobs sup/inf    Passive ROM seated and supine into knee flexion                    PT Short Term Goals - 02/20/20 2128      PT SHORT TERM GOAL #1   Title The patient will be indep with initial HEP.    Time 4    Period Weeks    Target Date 03/21/20      PT SHORT TERM GOAL #2   Title The patient will improve R knee flexion to 80 degrees A/ROM    Baseline A/AROM 48 deg    Time 4    Period Weeks    Target Date 03/21/20      PT SHORT TERM GOAL #3   Title The patient will improve AROM R knee to -9  degrees extension.    Baseline -15    Time 4    Period Weeks    Target Date 03/21/20      PT SHORT TERM GOAL #4   Title The patient will ambulate household distances without a device and improved knee flexion during gait cycle.    Time 4    Period Weeks    Target Date 03/21/20             PT Long Term Goals - 03/07/20 1702      PT LONG TERM GOAL #1   Title The patient will be indep with HEP.    Time 8    Period Weeks    Status On-going      PT LONG TERM GOAL #2   Title The patient will reduce limitation per FOTO to < or equal to 24% limitation.    Baseline 41% limitation    Time 8    Period Weeks    Status On-going      PT LONG TERM GOAL #3   Title The patient will improve A/ROM R knee to 5 to 105 degrees.    Baseline 13-65 flexion (PROM)    Time 8    Period Weeks    Status On-going      PT LONG TERM GOAL #4   Title The patient will negotiate community surfaces including stairs, curbs, unlevel ground without a device independently x500 ft.    Time 8    Period Weeks    Status On-going      PT LONG TERM GOAL #5   Title The patient will report R knee pain < or equal to 2/10.    Baseline 6/10 today    Time 8    Period Weeks    Status On-going                 Plan - 03/14/20 1551    Clinical Impression Statement Pt reports pain up to 5-6/10 in Rt ant knee with ROM exercises; pain reduces after rest.  Rt knee ROM remains very limited in flexion.  Encouraged pt to continue low load, long duration stretches into flexion  daily as well as working on increased weight bearing into RLE with SLS.  Goals are ongoing.    Personal Factors and Comorbidities Comorbidity 2    Comorbidities HTN, h/o migraines    Examination-Activity Limitations Locomotion Level;Squat;Stairs;Stand    PT Frequency 2x / week    PT Duration 8 weeks    PT Treatment/Interventions ADLs/Self Care Home Management;Vasopneumatic Device;Taping;Manual techniques;Therapeutic activities;Therapeutic  exercise;Functional mobility training;Gait training;Stair training;Patient/family education;Electrical Stimulation;Moist Heat;Cryotherapy    PT Next Visit Plan aggressive knee flexion ROM, continue working to normalize gait.    PT Home Exercise Plan Access Code: [email or web address]    Consulted and Agree with Plan of Care Patient           Patient will benefit from skilled therapeutic intervention in order to improve the following deficits and impairments:  Pain,Decreased range of motion,Abnormal gait,Decreased strength,Increased edema,Hypomobility,Impaired flexibility,Increased fascial restricitons  Visit Diagnosis: Acute pain of right knee  Muscle weakness (generalized)  Other abnormalities of gait and mobility     Problem List Patient Active Problem List   Diagnosis Date Noted  . Need for hepatitis C screening test 01/24/2020  . Hyperglycemia 01/24/2020  . Ovarian cyst 11/26/2017  . Obesity (BMI 35.0-39.9 without comorbidity) 07/14/2017  . Multinodular goiter 07/17/2016  . Migraine 06/30/2016  . Plantar fasciitis of left foot 09/16/2013  . LBP (low back pain) 09/05/2013  . Hypertension 04/05/2013  . Vaginitis and vulvovaginitis 03/15/2013  . Hydradenitis 08/20/2011   Kerin Perna, PTA 03/14/20 4:27 PM  [place], [place], [address] Phone: [phone]   Fax:  [phone]  Name: Amber Reilly MRN: [id number] Date of Birth: 1965/09/10

## 2020-03-19 ENCOUNTER — Other Ambulatory Visit: Payer: Self-pay

## 2020-03-19 ENCOUNTER — Encounter: Payer: Self-pay | Admitting: Physical Therapy

## 2020-03-19 ENCOUNTER — Ambulatory Visit (INDEPENDENT_AMBULATORY_CARE_PROVIDER_SITE_OTHER): Payer: 59 | Admitting: Physical Therapy

## 2020-03-19 DIAGNOSIS — M25561 Pain in right knee: Secondary | ICD-10-CM

## 2020-03-19 DIAGNOSIS — M6281 Muscle weakness (generalized): Secondary | ICD-10-CM | POA: Diagnosis not present

## 2020-03-19 DIAGNOSIS — R2689 Other abnormalities of gait and mobility: Secondary | ICD-10-CM

## 2020-03-19 NOTE — Therapy (Signed)
[place], [place], [address] Phone: [phone]   Fax:  [phone]  Physical Therapy Treatment  Patient Details  Name: Amber Reilly MRN: [id number] Date of Birth: 1965-07-01 Referring Provider (PT): Tracie Harrier, MD   Encounter Date: 03/19/2020   PT End of Session - 03/19/20 1349    Visit Number 9    Number of Visits 16    Date for PT Re-Evaluation 04/20/20    Authorization Type UHC    PT Start Time 1350   ice at end of session   PT Stop Time 1437    PT Time Calculation (min) 47 min    Activity Tolerance Patient limited by pain    Behavior During Therapy [facility] for tasks assessed/performed           Past Medical History:  Diagnosis Date  . Hypertension     Past Surgical History:  Procedure Laterality Date  . CESAREAN SECTION    . ENDOMETRIAL ABLATION    . FOOT SURGERY Left   . KNEE SURGERY Left   . LAPAROTOMY N/A 11/26/2017   Procedure: EXPLORATORY LAPAROTOMY;  Surgeon: [name], MD;  Location: [facility];  Service: Gynecology;  Laterality: N/A;  . UMBILICAL HERNIA REPAIR N/A 11/26/2017   Procedure: HERNIA REPAIR UMBILICAL ADULT;  Surgeon: [name], MD;  Location: [facility];  Service: Gynecology;  Laterality: N/A;  . UNILATERAL SALPINGECTOMY Right 11/26/2017   Procedure: RIGHT SALPINGO-OOPHORECTOMY;  Surgeon: [name], MD;  Location: [facility];  Service: Gynecology;  Laterality: Right;    There were no vitals filed for this visit.   Subjective Assessment - 03/19/20 1350    Subjective Pt reports that her knee is pretty good today.    Patient Stated Goals Have mobility back and improve mobility.    Currently in Pain? No/denies              [facility] PT Assessment - 03/19/20 0001      Assessment   Medical Diagnosis R TKR    Referring Provider (PT) Tracie Harrier, MD                         [facility] Adult PT Treatment/Exercise - 03/19/20 0001       Knee/Hip Exercises: Aerobic   Stationary Bike L0 partial revolutions x 5.5 min   pt reports she has pain medial knee with bend     Knee/Hip Exercises: Standing   Other Standing Knee Exercises 2x10 self knee flexion mobs placing foot on 8" step      Knee/Hip Exercises: Seated   Other Seated Knee/Hip Exercises knee flexion stretching on leg extension machine = 5x30sec stretches      Knee/Hip Exercises: Supine   Heel Slides AAROM;Right;15 reps   using strap to help pull knee up     Cryotherapy   Number Minutes Cryotherapy 10 Minutes    Cryotherapy Location Knee    Type of Cryotherapy Ice pack      Manual Therapy   Soft tissue mobilization STM to lateral Rt quad to reduce muscular tightness                    PT Short Term Goals - 02/20/20 2128      PT SHORT TERM GOAL #1   Title The patient will be indep with initial HEP.    Time 4    Period Weeks  Target Date 03/21/20      PT SHORT TERM GOAL #2   Title The patient will improve R knee flexion to 80 degrees A/ROM    Baseline A/AROM 48 deg    Time 4    Period Weeks    Target Date 03/21/20      PT SHORT TERM GOAL #3   Title The patient will improve AROM R knee to -9 degrees extension.    Baseline -15    Time 4    Period Weeks    Target Date 03/21/20      PT SHORT TERM GOAL #4   Title The patient will ambulate household distances without a device and improved knee flexion during gait cycle.    Time 4    Period Weeks    Target Date 03/21/20             PT Long Term Goals - 03/07/20 1702      PT LONG TERM GOAL #1   Title The patient will be indep with HEP.    Time 8    Period Weeks    Status On-going      PT LONG TERM GOAL #2   Title The patient will reduce limitation per FOTO to < or equal to 24% limitation.    Baseline 41% limitation    Time 8    Period Weeks    Status On-going      PT LONG TERM GOAL #3   Title The patient will improve A/ROM R knee to 5 to 105 degrees.    Baseline 13-65  flexion (PROM)    Time 8    Period Weeks    Status On-going      PT LONG TERM GOAL #4   Title The patient will negotiate community surfaces including stairs, curbs, unlevel ground without a device independently x500 ft.    Time 8    Period Weeks    Status On-going      PT LONG TERM GOAL #5   Title The patient will report R knee pain < or equal to 2/10.    Baseline 6/10 today    Time 8    Period Weeks    Status On-going                 Plan - 03/19/20 1427    Clinical Impression Statement Amber Reilly is very limited with Rt knee flexion, todays session was focused on improving this today.  She reports she is working on improving it at home. recommend she perform heel slides while taking her bath.  No goals met today.    Rehab Potential Good    PT Frequency 2x / week    PT Duration 8 weeks    PT Treatment/Interventions ADLs/Self Care Home Management;Vasopneumatic Device;Taping;Manual techniques;Therapeutic activities;Therapeutic exercise;Functional mobility training;Gait training;Stair training;Patient/family education;Electrical Stimulation;Moist Heat;Cryotherapy    PT Next Visit Plan aggressive knee flexion ROM, continue working to normalize gait.    PT Home Exercise Plan Access Code: [email or web address]    Consulted and Agree with Plan of Care Patient           Patient will benefit from skilled therapeutic intervention in order to improve the following deficits and impairments:  Pain,Decreased range of motion,Abnormal gait,Decreased strength,Increased edema,Hypomobility,Impaired flexibility,Increased fascial restricitons  Visit Diagnosis: Acute pain of right knee  Muscle weakness (generalized)  Other abnormalities of gait and mobility     Problem List Patient Active Problem List   Diagnosis Date Noted  . Need  for hepatitis C screening test 01/24/2020  . Hyperglycemia 01/24/2020  . Ovarian cyst 11/26/2017  . Obesity (BMI 35.0-39.9 without comorbidity) 07/14/2017  .  Multinodular goiter 07/17/2016  . Migraine 06/30/2016  . Plantar fasciitis of left foot 09/16/2013  . LBP (low back pain) 09/05/2013  . Hypertension 04/05/2013  . Vaginitis and vulvovaginitis 03/15/2013  . Hydradenitis 08/20/2011    Boneta Lucks rPT  03/19/2020, 2:30 PM  [facility] [place], [place], [address] Phone: [phone]   Fax:  [phone]  Name: Amber Reilly MRN: [id number] Date of Birth: 20-Mar-1966

## 2020-03-26 ENCOUNTER — Encounter: Payer: 59 | Admitting: Physical Therapy

## 2020-03-29 ENCOUNTER — Encounter: Payer: 59 | Admitting: Rehabilitative and Restorative Service Providers"

## 2020-04-03 ENCOUNTER — Other Ambulatory Visit: Payer: Self-pay

## 2020-04-03 ENCOUNTER — Encounter: Payer: Self-pay | Admitting: Physical Therapy

## 2020-04-03 ENCOUNTER — Ambulatory Visit (INDEPENDENT_AMBULATORY_CARE_PROVIDER_SITE_OTHER): Payer: 59 | Admitting: Physical Therapy

## 2020-04-03 DIAGNOSIS — M6281 Muscle weakness (generalized): Secondary | ICD-10-CM

## 2020-04-03 DIAGNOSIS — R2689 Other abnormalities of gait and mobility: Secondary | ICD-10-CM | POA: Diagnosis not present

## 2020-04-03 DIAGNOSIS — M25561 Pain in right knee: Secondary | ICD-10-CM

## 2020-04-03 NOTE — Therapy (Signed)
[place], [place], [address] Phone: [phone]   Fax:  [phone]  Physical Therapy Treatment  Patient Details  Name: Amber Reilly MRN: [id number] Date of Birth: [birth year] Referring Provider (PT): Tracie Harrier, MD   Encounter Date: 04/03/2020   PT End of Session - 04/03/20 1019    Visit Number 10    Number of Visits 16    Date for PT Re-Evaluation 04/20/20    Authorization Type UHC    PT Start Time [id number]    PT Stop Time 1105   ice at end   PT Time Calculation (min) 46 min    Activity Tolerance Patient tolerated treatment well    Behavior During Therapy [facility] for tasks assessed/performed           Past Medical History:  Diagnosis Date  . Hypertension     Past Surgical History:  Procedure Laterality Date  . CESAREAN SECTION    . ENDOMETRIAL ABLATION    . FOOT SURGERY Left   . KNEE SURGERY Left   . LAPAROTOMY N/A 11/26/2017   Procedure: EXPLORATORY LAPAROTOMY;  Surgeon: [name], MD;  [facility];  Service: Gynecology;  Laterality: N/A;  . UMBILICAL HERNIA REPAIR N/A 11/26/2017   Procedure: HERNIA REPAIR UMBILICAL ADULT;  Surgeon: [name], MD;  [facility]: [facility];  Service: Gynecology;  Laterality: N/A;  . UNILATERAL SALPINGECTOMY Right 11/26/2017   Procedure: RIGHT SALPINGO-OOPHORECTOMY;  Surgeon: [name], MD;  [facility]: [facility];  Service: Gynecology;  Laterality: Right;    There were no vitals filed for this visit.   Subjective Assessment - 04/03/20 1019    Subjective Pt reports she is walking better, almost didn't make it today because her sister passed the other day.    Patient Stated Goals Have mobility back and improve mobility.    Currently in Pain? No/denies              [facility] PT Assessment - 04/03/20 0001      Assessment   Medical Diagnosis R TKR    Referring Provider (PT) Tracie Harrier, MD    Next MD Visit 04/07/19      PROM    Right Knee Flexion 63   on leg press, 70 with knee extension machine pressing back                        [facility] Adult PT Treatment/Exercise - 04/03/20 0001      Exercises   Exercises Knee/Hip      Knee/Hip Exercises: Aerobic   Stationary Bike L0 partial revolutions x 5.5 min    Nustep L2 x 5 min, seat 8 for knee flexion stretch using UE to assist      Knee/Hip Exercises: Machines for Strengthening   Cybex Knee Extension 20 reps, 2 plates, Lt LE assisting Rt and then working on knee flexion Rt    Total Gym Leg Press 20 reps, 4 plates, Lt LE assisting Rt and working on improving flexion in Rt knee      Modalities   Modalities Cryotherapy      Cryotherapy   Number Minutes Cryotherapy 10 Minutes    Cryotherapy Location Knee    Type of Cryotherapy Ice pack      Manual Therapy   Manual Therapy Taping    [employer] geologist Space star over medial  Rt knee joint for pain relief, I strip lateral knee as well                    PT Short Term Goals - 02/20/20 2128      PT SHORT TERM GOAL #1   Title The patient will be indep with initial HEP.    Time 4    Period Weeks    Target Date 03/21/20      PT SHORT TERM GOAL #2   Title The patient will improve R knee flexion to 80 degrees A/ROM    Baseline A/AROM 48 deg    Time 4    Period Weeks    Target Date 03/21/20      PT SHORT TERM GOAL #3   Title The patient will improve AROM R knee to -9 degrees extension.    Baseline -15    Time 4    Period Weeks    Target Date 03/21/20      PT SHORT TERM GOAL #4   Title The patient will ambulate household distances without a device and improved knee flexion during gait cycle.    Time 4    Period Weeks    Target Date 03/21/20             PT Long Term Goals - 03/07/20 1702      PT LONG TERM GOAL #1   Title The patient will be indep with HEP.    Time 8    Period Weeks    Status On-going      PT LONG TERM GOAL #2   Title  The patient will reduce limitation per FOTO to < or equal to 24% limitation.    Baseline 41% limitation    Time 8    Period Weeks    Status On-going      PT LONG TERM GOAL #3   Title The patient will improve A/ROM R knee to 5 to 105 degrees.    Baseline 13-65 flexion (PROM)    Time 8    Period Weeks    Status On-going      PT LONG TERM GOAL #4   Title The patient will negotiate community surfaces including stairs, curbs, unlevel ground without a device independently x500 ft.    Time 8    Period Weeks    Status On-going      PT LONG TERM GOAL #5   Title The patient will report R knee pain < or equal to 2/10.    Baseline 6/10 today    Time 8    Period Weeks    Status On-going                 Plan - 04/03/20 1026    Clinical Impression Statement Pt continues with decreased Rt knee flexion and tightness,  She is able to push her bend more with the nustep, does not get as much motion on the bike. Kathelene sees the MD latera this week, she thinks he is going to have her under go an manipulation to break up the scar tissue.  She will call us to let us know the plan    Rehab Potential Good    PT Frequency 2x / week    PT Duration 8 weeks    PT Treatment/Interventions ADLs/Self Care Home Management;Vasopneumatic Device;Taping;Manual techniques;Therapeutic activities;Therapeutic exercise;Functional mobility training;Gait training;Stair training;Patient/family education;Electrical Stimulation;Moist Heat;Cryotherapy    PT Next Visit Plan see what MD recommends  PT Home Exercise Plan Access Code: [email or web address]    Consulted and Agree with Plan of Care Patient           Patient will benefit from skilled therapeutic intervention in order to improve the following deficits and impairments:  Pain,Decreased range of motion,Abnormal gait,Decreased strength,Increased edema,Hypomobility,Impaired flexibility,Increased fascial restricitons  Visit Diagnosis: Acute pain of right knee  Muscle  weakness (generalized)  Other abnormalities of gait and mobility     Problem List Patient Active Problem List   Diagnosis Date Noted  . Need for hepatitis C screening test 01/24/2020  . Hyperglycemia 01/24/2020  . Ovarian cyst 11/26/2017  . Obesity (BMI 35.0-39.9 without comorbidity) 07/14/2017  . Multinodular goiter 07/17/2016  . Migraine 06/30/2016  . Plantar fasciitis of left foot 09/16/2013  . LBP (low back pain) 09/05/2013  . Hypertension 04/05/2013  . Vaginitis and vulvovaginitis 03/15/2013  . Hydradenitis 08/20/2011    Manuela Schwartz Shajuan Musso PT  04/03/2020, 10:58 AM  [facility] [place], [place], [address] Phone: [phone]   Fax:  [phone]  Name: INESHA PINHEIRO MRN: [id number] Date of Birth: 10-09-1965

## 2020-04-05 ENCOUNTER — Encounter: Payer: Self-pay | Admitting: Physical Therapy

## 2020-04-05 ENCOUNTER — Ambulatory Visit (INDEPENDENT_AMBULATORY_CARE_PROVIDER_SITE_OTHER): Payer: 59 | Admitting: Physical Therapy

## 2020-04-05 ENCOUNTER — Other Ambulatory Visit: Payer: Self-pay

## 2020-04-05 DIAGNOSIS — M25561 Pain in right knee: Secondary | ICD-10-CM | POA: Diagnosis not present

## 2020-04-05 DIAGNOSIS — R2689 Other abnormalities of gait and mobility: Secondary | ICD-10-CM | POA: Diagnosis not present

## 2020-04-05 DIAGNOSIS — M6281 Muscle weakness (generalized): Secondary | ICD-10-CM

## 2020-04-05 NOTE — Therapy (Signed)
[place], [place], [address] Phone: [phone]   Fax:  [phone]  Physical Therapy Treatment  Patient Details  Name: Amber Reilly MRN: [id number] Date of Birth: Jul 24, 1965 Referring Provider (PT): Tracie Harrier, MD   Encounter Date: 04/05/2020   PT End of Session - 04/05/20 1433    Visit Number 11    Number of Visits 16    Date for PT Re-Evaluation 04/20/20    Authorization Type UHC    PT Start Time 2423    PT Stop Time 1526    PT Time Calculation (min) 52 min    Activity Tolerance Patient tolerated treatment well    Behavior During Therapy [facility] for tasks assessed/performed           Past Medical History:  Diagnosis Date  . Hypertension     Past Surgical History:  Procedure Laterality Date  . CESAREAN SECTION    . ENDOMETRIAL ABLATION    . FOOT SURGERY Left   . KNEE SURGERY Left   . LAPAROTOMY N/A 11/26/2017   Procedure: EXPLORATORY LAPAROTOMY;  Surgeon: [name], MD;  [facility]: [facility];  Service: Gynecology;  Laterality: N/A;  . UMBILICAL HERNIA REPAIR N/A 11/26/2017   Procedure: HERNIA REPAIR UMBILICAL ADULT;  Surgeon: [name], MD;  [facility]: [facility];  Service: Gynecology;  Laterality: N/A;  . UNILATERAL SALPINGECTOMY Right 11/26/2017   Procedure: RIGHT SALPINGO-OOPHORECTOMY;  Surgeon: [name], MD;  [facility]: [facility];  Service: Gynecology;  Laterality: Right;    There were no vitals filed for this visit.   Subjective Assessment - 04/05/20 1525    Subjective Patient reports she is working hard on ROM.    Pertinent History HTN, h/o migraines    Patient Stated Goals Have mobility back and improve mobility.    Currently in Pain? No/denies              [facility] PT Assessment - 04/05/20 0001      AROM   Right Knee Extension -12    Right Knee Flexion 60      PROM   Right Knee Extension -5    Right Knee Flexion 64   on leg press, 70 with knee extension  machine pressing back                        [facility] Adult PT Treatment/Exercise - 04/05/20 0001      Ambulation/Gait   Ambulation/Gait Yes    Ambulation/Gait Assistance 7: Independent    Ambulation Distance (Feet) 80 Feet    Assistive device None    Gait Pattern Decreased step length - right;Decreased step length - left;Decreased stance time - right;Decreased stance time - left;Decreased stride length;Decreased hip/knee flexion - right    Ambulation Surface Level    Pre-Gait Activities exaggerated step length x 20 feet to encourage normalized gait      Knee/Hip Exercises: Aerobic   Stationary Bike L0 partial revolutions x 5 min    Nustep L2 x 5 min, seat 8 for knee flexion stretch using UE to assist      Knee/Hip Exercises: Machines for Strengthening   Cybex Knee Extension 20 reps, 2 plates, Lt LE assisting Rt and then working on knee flexion Rt    Total Gym Leg Press 20 reps, 4 plates, Lt LE assisting Rt and working on improving flexion in Rt knee  PT Short Term Goals - 04/05/20 1448      PT SHORT TERM GOAL #1   Title The patient will be indep with initial HEP.    Status Achieved      PT SHORT TERM GOAL #2   Title The patient will improve R knee flexion to 80 degrees A/ROM    Baseline 64 deg; 70 deg A/AROM using knee extension machine    Status On-going      PT SHORT TERM GOAL #3   Title The patient will improve AROM R knee to -9 degrees extension.    Baseline -10 deg    Status On-going      PT SHORT TERM GOAL #4   Title The patient will ambulate household distances without a device and improved knee flexion during gait cycle.    Baseline still limited with knee flexion during gait    Status Partially Met             PT Long Term Goals - 03/07/20 1702      PT LONG TERM GOAL #1   Title The patient will be indep with HEP.    Time 8    Period Weeks    Status On-going      PT LONG TERM GOAL #2   Title The patient will  reduce limitation per FOTO to < or equal to 24% limitation.    Baseline 41% limitation    Time 8    Period Weeks    Status On-going      PT LONG TERM GOAL #3   Title The patient will improve A/ROM R knee to 5 to 105 degrees.    Baseline 13-65 flexion (PROM)    Time 8    Period Weeks    Status On-going      PT LONG TERM GOAL #4   Title The patient will negotiate community surfaces including stairs, curbs, unlevel ground without a device independently x500 ft.    Time 8    Period Weeks    Status On-going      PT LONG TERM GOAL #5   Title The patient will report R knee pain < or equal to 2/10.    Baseline 6/10 today    Time 8    Period Weeks    Status On-going                 Plan - 04/05/20 1520    Clinical Impression Statement Patient is progressing slowly with AROM, but has not made much progress with PROM which today was 5-64 degrees of knee flexion. She is able to reach 70 deg of flexion with low load on knee extension machine. She is working hard at home with stretching. She is no longer walking with a cane, but still has gait deficits including decreased step and stride length and decreased knee flexion in swing.    Personal Factors and Comorbidities Comorbidity 2    Comorbidities HTN, h/o migraines    Examination-Activity Limitations Locomotion Level;Squat;Stairs;Stand    PT Frequency 2x / week    PT Duration 8 weeks    PT Treatment/Interventions ADLs/Self Care Home Management;Vasopneumatic Device;Taping;Manual techniques;Therapeutic activities;Therapeutic exercise;Functional mobility training;Gait training;Stair training;Patient/family education;Electrical Stimulation;Moist Heat;Cryotherapy    PT Next Visit Plan see what MD recommends    PT Home Exercise Plan Access Code: [email or web address]    Consulted and Agree with Plan of Care Patient           Patient will benefit from skilled therapeutic  intervention in order to improve the following deficits and impairments:   Pain,Decreased range of motion,Abnormal gait,Decreased strength,Increased edema,Hypomobility,Impaired flexibility,Increased fascial restricitons  Visit Diagnosis: Acute pain of right knee  Muscle weakness (generalized)  Other abnormalities of gait and mobility     Problem List Patient Active Problem List   Diagnosis Date Noted  . Need for hepatitis C screening test 01/24/2020  . Hyperglycemia 01/24/2020  . Ovarian cyst 11/26/2017  . Obesity (BMI 35.0-39.9 without comorbidity) 07/14/2017  . Multinodular goiter 07/17/2016  . Migraine 06/30/2016  . Plantar fasciitis of left foot 09/16/2013  . LBP (low back pain) 09/05/2013  . Hypertension 04/05/2013  . Vaginitis and vulvovaginitis 03/15/2013  . Hydradenitis 08/20/2011    Madelyn Flavors PT 04/05/2020, 4:45 PM  [facility] [place], [place], [address] Phone: [phone]   Fax:  [phone]  Name: Amber Reilly MRN: [id number] Date of Birth: Nov 17, 1965

## 2020-04-09 ENCOUNTER — Other Ambulatory Visit: Payer: Self-pay

## 2020-04-09 ENCOUNTER — Encounter: Payer: Self-pay | Admitting: Physical Therapy

## 2020-04-09 ENCOUNTER — Ambulatory Visit (INDEPENDENT_AMBULATORY_CARE_PROVIDER_SITE_OTHER): Payer: 59 | Admitting: Physical Therapy

## 2020-04-09 DIAGNOSIS — R2689 Other abnormalities of gait and mobility: Secondary | ICD-10-CM | POA: Diagnosis not present

## 2020-04-09 DIAGNOSIS — M25561 Pain in right knee: Secondary | ICD-10-CM

## 2020-04-09 DIAGNOSIS — M6281 Muscle weakness (generalized): Secondary | ICD-10-CM | POA: Diagnosis not present

## 2020-04-09 NOTE — Therapy (Signed)
[place], [place], [address] Phone: [phone]   Fax:  [phone]  Physical Therapy Treatment  Patient Details  Name: Amber Reilly MRN: [id number] Date of Birth: [birth year] Referring Provider (PT): Tracie Harrier, MD   Encounter Date: 04/09/2020   PT End of Session - 04/09/20 1438    Visit Number 12    Number of Visits 16    Date for PT Re-Evaluation 04/20/20    Authorization Type UHC    PT Start Time 1440    PT Stop Time 1515    PT Time Calculation (min) 35 min    Activity Tolerance Patient tolerated treatment well    Behavior During Therapy [facility] for tasks assessed/performed           Past Medical History:  Diagnosis Date  . Hypertension     Past Surgical History:  Procedure Laterality Date  . CESAREAN SECTION    . ENDOMETRIAL ABLATION    . FOOT SURGERY Left   . KNEE SURGERY Left   . LAPAROTOMY N/A 11/26/2017   Procedure: EXPLORATORY LAPAROTOMY;  Surgeon: [name], MD;  Location: [facility];  Service: Gynecology;  Laterality: N/A;  . UMBILICAL HERNIA REPAIR N/A 11/26/2017   Procedure: HERNIA REPAIR UMBILICAL ADULT;  Surgeon: [name], MD;  Location: [facility];  Service: Gynecology;  Laterality: N/A;  . UNILATERAL SALPINGECTOMY Right 11/26/2017   Procedure: RIGHT SALPINGO-OOPHORECTOMY;  Surgeon: [name], MD;  Location: [facility];  Service: Gynecology;  Laterality: Right;    There were no vitals filed for this visit.   Subjective Assessment - 04/09/20 1438    Subjective Patient saw MD and she plans to continue PT for another 2-3 weeks and work on ROM at home before considering a maniupulaion.    Pertinent History HTN, h/o migraines    Patient Stated Goals Have mobility back and improve mobility.    Currently in Pain? No/denies              [facility] PT Assessment - 04/09/20 0001      Assessment   Medical Diagnosis R TKR    Referring Provider (PT) Tracie Harrier, MD    Onset Date/Surgical Date 01/31/20    Hand Dominance Right    Next MD Visit 04/27/20                         [facility] Adult PT Treatment/Exercise - 04/09/20 0001      Knee/Hip Exercises: Stretches   Knee: Self-Stretch to increase Flexion Right;5 reps;30 seconds    Knee: Self-Stretch Limitations on top of steps    Other Knee/Hip Stretches 1/2 kneel on mat table and sitting back toward foot x 3      Knee/Hip Exercises: Aerobic   Stationary Bike L0 partial revolutions x 3 min    Nustep L2 x 5 min, seat 8 for knee flexion stretch using UE to assist      Knee/Hip Exercises: Machines for Strengthening   Cybex Knee Extension 20 reps, 2 plates, Lt LE assisting Rt and then working on knee flexion Rt   to 71 deg today   Total Gym Leg Press 20 reps, 4 plates, Lt LE assisting Rt and working on improving flexion in Rt knee                    PT Short Term Goals - 04/05/20 1448  PT SHORT TERM GOAL #1   Title The patient will be indep with initial HEP.    Status Achieved      PT SHORT TERM GOAL #2   Title The patient will improve R knee flexion to 80 degrees A/ROM    Baseline 64 deg; 70 deg A/AROM using knee extension machine    Status On-going      PT SHORT TERM GOAL #3   Title The patient will improve AROM R knee to -9 degrees extension.    Baseline -10 deg    Status On-going      PT SHORT TERM GOAL #4   Title The patient will ambulate household distances without a device and improved knee flexion during gait cycle.    Baseline still limited with knee flexion during gait    Status Partially Met             PT Long Term Goals - 03/07/20 1702      PT LONG TERM GOAL #1   Title The patient will be indep with HEP.    Time 8    Period Weeks    Status On-going      PT LONG TERM GOAL #2   Title The patient will reduce limitation per FOTO to < or equal to 24% limitation.    Baseline 41% limitation    Time 8    Period Weeks    Status On-going       PT LONG TERM GOAL #3   Title The patient will improve A/ROM R knee to 5 to 105 degrees.    Baseline 13-65 flexion (PROM)    Time 8    Period Weeks    Status On-going      PT LONG TERM GOAL #4   Title The patient will negotiate community surfaces including stairs, curbs, unlevel ground without a device independently x500 ft.    Time 8    Period Weeks    Status On-going      PT LONG TERM GOAL #5   Title The patient will report R knee pain < or equal to 2/10.    Baseline 6/10 today    Time 8    Period Weeks    Status On-going                 Plan - 04/09/20 1519    Clinical Impression Statement Patient saw MD and he would like her to continue PT and working on ROM at home for 2-3 more weeks. She sees him again on 04/27/20. She was 8 min late today so treatment was shortened. Deferred vaso today due to pt having a meeting.    Personal Factors and Comorbidities Comorbidity 2    Comorbidities HTN, h/o migraines    Examination-Activity Limitations Locomotion Level;Squat;Stairs;Stand    PT Treatment/Interventions ADLs/Self Care Home Management;Vasopneumatic Device;Taping;Manual techniques;Therapeutic activities;Therapeutic exercise;Functional mobility training;Gait training;Stair training;Patient/family education;Electrical Stimulation;Moist Heat;Cryotherapy    PT Next Visit Plan continue aggressive ROM           Patient will benefit from skilled therapeutic intervention in order to improve the following deficits and impairments:  Pain,Decreased range of motion,Abnormal gait,Decreased strength,Increased edema,Hypomobility,Impaired flexibility,Increased fascial restricitons  Visit Diagnosis: Acute pain of right knee  Muscle weakness (generalized)  Other abnormalities of gait and mobility     Problem List Patient Active Problem List   Diagnosis Date Noted  . Need for hepatitis C screening test 01/24/2020  . Hyperglycemia 01/24/2020  . Ovarian cyst 11/26/2017  .  Obesity (BMI 35.0-39.9 without comorbidity) 07/14/2017  . Multinodular goiter 07/17/2016  . Migraine 06/30/2016  . Plantar fasciitis of left foot 09/16/2013  . LBP (low back pain) 09/05/2013  . Hypertension 04/05/2013  . Vaginitis and vulvovaginitis 03/15/2013  . Hydradenitis 08/20/2011    Madelyn Flavors PT 04/09/2020, 3:22 PM  [facility] [place], [place], [address] Phone: [phone]   Fax:  [phone]  Name: AIRLIE BLUMENBERG MRN: [id number] Date of Birth: [birth year]

## 2020-04-11 ENCOUNTER — Other Ambulatory Visit: Payer: Self-pay

## 2020-04-11 ENCOUNTER — Ambulatory Visit (INDEPENDENT_AMBULATORY_CARE_PROVIDER_SITE_OTHER): Payer: 59 | Admitting: Physical Therapy

## 2020-04-11 ENCOUNTER — Encounter: Payer: Self-pay | Admitting: Physical Therapy

## 2020-04-11 DIAGNOSIS — M25561 Pain in right knee: Secondary | ICD-10-CM | POA: Diagnosis not present

## 2020-04-11 DIAGNOSIS — M6281 Muscle weakness (generalized): Secondary | ICD-10-CM | POA: Diagnosis not present

## 2020-04-11 DIAGNOSIS — R2689 Other abnormalities of gait and mobility: Secondary | ICD-10-CM

## 2020-04-11 NOTE — Therapy (Signed)
[facility] [place] [address] [place], [place], [address] Phone: [phone]   Fax:  [phone]  Physical Therapy Treatment  Patient Details  Name: Amber Reilly MRN: [id number] Date of Birth: [birth year] Referring Provider (PT): Karle Starch, MD   Encounter Date: 04/11/2020   PT End of Session - 04/11/20 1347    Visit Number 13    Number of Visits 16    Date for PT Re-Evaluation 04/20/20    Authorization Type UHC    PT Start Time 1348    PT Stop Time 1439    PT Time Calculation (min) 51 min    Activity Tolerance Patient tolerated treatment well    Behavior During Therapy [facility] for tasks assessed/performed           Past Medical History:  Diagnosis Date  . Hypertension     Past Surgical History:  Procedure Laterality Date  . CESAREAN SECTION    . ENDOMETRIAL ABLATION    . FOOT SURGERY Left   . KNEE SURGERY Left   . LAPAROTOMY N/A 11/26/2017   Procedure: EXPLORATORY LAPAROTOMY;  Surgeon: Adolphus Birchwood, MD;  Location: [facility];  Service: Gynecology;  Laterality: N/A;  . UMBILICAL HERNIA REPAIR N/A 11/26/2017   Procedure: HERNIA REPAIR UMBILICAL ADULT;  Surgeon: Adolphus Birchwood, MD;  Location: [facility];  Service: Gynecology;  Laterality: N/A;  . UNILATERAL SALPINGECTOMY Right 11/26/2017   Procedure: RIGHT SALPINGO-OOPHORECTOMY;  Surgeon: Adolphus Birchwood, MD;  Location: [facility];  Service: Gynecology;  Laterality: Right;    There were no vitals filed for this visit.   Subjective Assessment - 04/11/20 1349    Subjective Pt very happy that her MD isn't pushing for a manipulation.  Feels stiff with the cold weather.    Patient Stated Goals Have mobility back and improve mobility.    Currently in Pain? No/denies   only with bending             [facility] PT Assessment - 04/11/20 0001      AROM   Right Knee Flexion 74   seated on EOB after froam rolling     PROM   Right Knee Flexion --                          [facility] Adult PT Treatment/Exercise - 04/11/20 0001      Ambulation/Gait   Pre-Gait Activities working on knee flexion at toe off Rt with big steps      Self-Care   Self-Care Other Self-Care Comments    Other Self-Care Comments  foam roll distal Rt quad 3x60sec      Exercises   Exercises Knee/Hip      Knee/Hip Exercises: Stretches   Land relax with  therapist over pressure      Knee/Hip Exercises: Aerobic   Stationary Bike L0 partial revolutions x 3 min    Nustep L2 x 5 min, seat 7 for knee flexion stretch using UE to assist      Knee/Hip Exercises: Machines for Strengthening   Cybex Knee Extension 15 reps, 2 plates, Lt LE assisting Rt and then working on knee flexion Rt   10-15 sec holds in flexion   Cybex Knee Flexion --   Lt LE PRN assist and then stretch into flexion   Total Gym Leg Press 15 reps, 10 sec holds 4 plates, Lt LE assisting Rt and  working on improving flexion in Rt knee. seat on 3      Knee/Hip Exercises: Standing   Forward Step Up Right;10 reps   10" step with UE assist   Other Standing Knee Exercises 20 reps HS curls Rt with knee blocked against cabinet      Knee/Hip Exercises: Seated   Heel Slides Strengthening;Right;20 reps   red band in available ROM                   PT Short Term Goals - 04/05/20 1448      PT SHORT TERM GOAL #1   Title The patient will be indep with initial HEP.    Status Achieved      PT SHORT TERM GOAL #2   Title The patient will improve R knee flexion to 80 degrees A/ROM    Baseline 64 deg; 70 deg A/AROM using knee extension machine    Status On-going      PT SHORT TERM GOAL #3   Title The patient will improve AROM R knee to -9 degrees extension.    Baseline -10 deg    Status On-going      PT SHORT TERM GOAL #4   Title The patient will ambulate household distances without a device and improved knee flexion during gait cycle.     Baseline still limited with knee flexion during gait    Status Partially Met             PT Long Term Goals - 03/07/20 1702      PT LONG TERM GOAL #1   Title The patient will be indep with HEP.    Time 8    Period Weeks    Status On-going      PT LONG TERM GOAL #2   Title The patient will reduce limitation per FOTO to < or equal to 24% limitation.    Baseline 41% limitation    Time 8    Period Weeks    Status On-going      PT LONG TERM GOAL #3   Title The patient will improve A/ROM R knee to 5 to 105 degrees.    Baseline 13-65 flexion (PROM)    Time 8    Period Weeks    Status On-going      PT LONG TERM GOAL #4   Title The patient will negotiate community surfaces including stairs, curbs, unlevel ground without a device independently x500 ft.    Time 8    Period Weeks    Status On-going      PT LONG TERM GOAL #5   Title The patient will report R knee pain < or equal to 2/10.    Baseline 6/10 today    Time 8    Period Weeks    Status On-going                 Plan - 04/11/20 1441    Clinical Impression Statement Amber Reilly picked up a little more knee flexion today.  Did well with contract relax and foam rolling of distal quad.  Would benefit from more of this.  She will ice/ heat at home as needed.    Rehab Potential Good    PT Frequency 2x / week    PT Duration 8 weeks    PT Treatment/Interventions ADLs/Self Care Home Management;Vasopneumatic Device;Taping;Manual techniques;Therapeutic activities;Therapeutic exercise;Functional mobility training;Gait training;Stair training;Patient/family education;Electrical Stimulation;Moist Heat;Cryotherapy    PT Next Visit Plan continue aggressive ROM  PT Home Exercise Plan Access Code: [email or web address]    Consulted and Agree with Plan of Care Patient           Patient will benefit from skilled therapeutic intervention in order to improve the following deficits and impairments:  Pain,Decreased range of motion,Abnormal  gait,Decreased strength,Increased edema,Hypomobility,Impaired flexibility,Increased fascial restricitons  Visit Diagnosis: Acute pain of right knee  Muscle weakness (generalized)  Other abnormalities of gait and mobility     Problem List Patient Active Problem List   Diagnosis Date Noted  . Need for hepatitis C screening test 01/24/2020  . Hyperglycemia 01/24/2020  . Ovarian cyst 11/26/2017  . Obesity (BMI 35.0-39.9 without comorbidity) 07/14/2017  . Multinodular goiter 07/17/2016  . Migraine 06/30/2016  . Plantar fasciitis of left foot 09/16/2013  . LBP (low back pain) 09/05/2013  . Hypertension 04/05/2013  . Vaginitis and vulvovaginitis 03/15/2013  . Hydradenitis 08/20/2011    Jeral Pinch PT  04/11/2020, 2:43 PM  [facility] [place], [place], [address] Phone: [phone]   Fax:  [phone]  Name: Amber Reilly MRN: [id number] Date of Birth: Mar 22, 1966

## 2020-04-18 ENCOUNTER — Encounter: Payer: 59 | Admitting: Physical Therapy

## 2020-04-20 ENCOUNTER — Other Ambulatory Visit: Payer: Self-pay

## 2020-04-20 ENCOUNTER — Ambulatory Visit (INDEPENDENT_AMBULATORY_CARE_PROVIDER_SITE_OTHER): Payer: 59 | Admitting: Rehabilitative and Restorative Service Providers"

## 2020-04-20 DIAGNOSIS — R2689 Other abnormalities of gait and mobility: Secondary | ICD-10-CM

## 2020-04-20 DIAGNOSIS — M6281 Muscle weakness (generalized): Secondary | ICD-10-CM | POA: Diagnosis not present

## 2020-04-20 DIAGNOSIS — M25561 Pain in right knee: Secondary | ICD-10-CM

## 2020-04-20 NOTE — Therapy (Signed)
[place], [place], [address] Phone: [phone]   Fax:  [phone]  Physical Therapy Treatment  Patient Details  Name: Amber Reilly MRN: [id number] Date of Birth: 01-26-1966 Referring Provider (PT): Tracie Harrier, MD   Encounter Date: 04/20/2020   PT End of Session - 04/20/20 1658    Visit Number 14    Number of Visits 16    Date for PT Re-Evaluation 04/20/20    Authorization Type UHC    Authorization - Visit Number 5    Authorization - Number of Visits 30   has 30 visits per year   PT Start Time 1530    PT Stop Time 1615    PT Time Calculation (min) 45 min    Activity Tolerance Patient tolerated treatment well    Behavior During Therapy [facility] for tasks assessed/performed           Past Medical History:  Diagnosis Date  . Hypertension     Past Surgical History:  Procedure Laterality Date  . CESAREAN SECTION    . ENDOMETRIAL ABLATION    . FOOT SURGERY Left   . KNEE SURGERY Left   . LAPAROTOMY N/A 11/26/2017   Procedure: EXPLORATORY LAPAROTOMY;  Surgeon: [name], MD;  Location: [facility];  Service: Gynecology;  Laterality: N/A;  . UMBILICAL HERNIA REPAIR N/A 11/26/2017   Procedure: HERNIA REPAIR UMBILICAL ADULT;  Surgeon: [name], MD;  Location: [facility];  Service: Gynecology;  Laterality: N/A;  . UNILATERAL SALPINGECTOMY Right 11/26/2017   Procedure: RIGHT SALPINGO-OOPHORECTOMY;  Surgeon: [name], MD;  Location: [facility];  Service: Gynecology;  Laterality: Right;    There were no vitals filed for this visit.   Subjective Assessment - 04/20/20 1557    Subjective The patient reports that she was not able to come earlier this week due to snow.    Pertinent History HTN, h/o migraines    Patient Stated Goals Have mobility back and improve mobility.    Currently in Pain? No/denies              [facility] PT Assessment - 04/20/20 1538      Assessment   Medical  Diagnosis R TKR    Referring Provider (PT) Tracie Harrier, MD    Onset Date/Surgical Date 01/31/20    Hand Dominance Right    Next MD Visit 04/27/20      AROM   Right Knee Flexion 80                         [facility] Adult PT Treatment/Exercise - 04/20/20 1546      Ambulation/Gait   Ambulation/Gait Yes    Ambulation/Gait Assistance 7: Independent    Gait Comments Working on heel strike, increasing knee flexion to initiate swing, and rolling over foot (not flexing knee too early).      Exercises   Exercises Knee/Hip      Knee/Hip Exercises: Stretches   Health and safety inspector relax with  therapist over pressure    Engineer, civil (consulting) Limitations slant board stretch x 30 seconds    Other Knee/Hip Stretches knee to chest with passive overpressure      Knee/Hip Exercises: Aerobic   Stationary Bike L0 partial revolutions x 4 minutes    Nustep L2 x 5 minutes using UE for passive overpressure  Other Aerobic *Patient arrived early and did 2 machines iwth distant supervision/ no charge      Knee/Hip Exercises: Machines for Strengthening   Cybex Knee Extension 10 reps, 2 plates, Lt LE assisting Rt and then working on knee flexion Rt    Total Gym Leg Press 10 reps x 10 seconds   4 plates L LE assisting and PT encouraging R LE do most work     Knee/Hip Exercises: Supine   Psychiatrist Limitations with cues for patellar mobiization    Other Supine Knee/Hip Exercises wall slides into flexion with L LE AAROM for overpressure      Manual Therapy   Manual Therapy Soft tissue mobilization;Joint mobilization    Manual therapy comments mobilization with movement and contract/relax used to reduce guarding    Joint Mobilization grade II-III AP tibia on femur for flexion and femur on tibia for extension    Soft tissue mobilization STM to medial hamstrings and medial patella with  patellar mobs    Passive ROM into flexion and extension                    PT Short Term Goals - 04/05/20 1448      PT SHORT TERM GOAL #1   Title The patient will be indep with initial HEP.    Status Achieved      PT SHORT TERM GOAL #2   Title The patient will improve R knee flexion to 80 degrees A/ROM    Baseline 64 deg; 70 deg A/AROM using knee extension machine    Status On-going      PT SHORT TERM GOAL #3   Title The patient will improve AROM R knee to -9 degrees extension.    Baseline -10 deg    Status On-going      PT SHORT TERM GOAL #4   Title The patient will ambulate household distances without a device and improved knee flexion during gait cycle.    Baseline still limited with knee flexion during gait    Status Partially Met             PT Long Term Goals - 03/07/20 1702      PT LONG TERM GOAL #1   Title The patient will be indep with HEP.    Time 8    Period Weeks    Status On-going      PT LONG TERM GOAL #2   Title The patient will reduce limitation per FOTO to < or equal to 24% limitation.    Baseline 41% limitation    Time 8    Period Weeks    Status On-going      PT LONG TERM GOAL #3   Title The patient will improve A/ROM R knee to 5 to 105 degrees.    Baseline 13-65 flexion (PROM)    Time 8    Period Weeks    Status On-going      PT LONG TERM GOAL #4   Title The patient will negotiate community surfaces including stairs, curbs, unlevel ground without a device independently x500 ft.    Time 8    Period Weeks    Status On-going      PT LONG TERM GOAL #5   Title The patient will report R knee pain < or equal to 2/10.    Baseline 6/10 today    Time 8    Period Weeks  Status On-going                 Plan - 04/20/20 1703    Clinical Impression Statement Amber Reilly continues with limitations in A/ROM and P/ROM limiting normal gait mechanics.  PT working on patellar mobility, STM to reduce muscle guarding and improve range of  motion.    PT Treatment/Interventions ADLs/Self Care Home Management;Vasopneumatic Device;Taping;Manual techniques;Therapeutic activities;Therapeutic exercise;Functional mobility training;Gait training;Stair training;Patient/family education;Electrical Stimulation;Moist Heat;Cryotherapy    PT Next Visit Plan continue aggressive ROM    PT Home Exercise Plan Access Code: [email or web address]           Patient will benefit from skilled therapeutic intervention in order to improve the following deficits and impairments:     Visit Diagnosis: Acute pain of right knee  Muscle weakness (generalized)  Other abnormalities of gait and mobility     Problem List Patient Active Problem List   Diagnosis Date Noted  . Need for hepatitis C screening test 01/24/2020  . Hyperglycemia 01/24/2020  . Ovarian cyst 11/26/2017  . Obesity (BMI 35.0-39.9 without comorbidity) 07/14/2017  . Multinodular goiter 07/17/2016  . Migraine 06/30/2016  . Plantar fasciitis of left foot 09/16/2013  . LBP (low back pain) 09/05/2013  . Hypertension 04/05/2013  . Vaginitis and vulvovaginitis 03/15/2013  . Hydradenitis 08/20/2011    Bana Borgmeyer , PT 04/20/2020, 5:06 PM  [facility] [place], [place], [address] Phone: [phone]   Fax:  [phone]  Name: Amber Reilly MRN: [id number] Date of Birth: May 29, 1965

## 2020-04-23 ENCOUNTER — Encounter: Payer: 59 | Admitting: Physical Therapy

## 2020-04-25 ENCOUNTER — Ambulatory Visit: Payer: 59 | Admitting: Physical Therapy

## 2020-04-25 ENCOUNTER — Encounter: Payer: Self-pay | Admitting: Physical Therapy

## 2020-04-25 ENCOUNTER — Other Ambulatory Visit: Payer: Self-pay

## 2020-04-25 DIAGNOSIS — R2689 Other abnormalities of gait and mobility: Secondary | ICD-10-CM | POA: Diagnosis not present

## 2020-04-25 DIAGNOSIS — M25561 Pain in right knee: Secondary | ICD-10-CM

## 2020-04-25 DIAGNOSIS — M6281 Muscle weakness (generalized): Secondary | ICD-10-CM

## 2020-04-25 NOTE — Therapy (Addendum)
[facility] [place] [address] [place], [place], [address] Phone: [phone]   Fax:  [phone]  Physical Therapy Treatment and Renewal Summary  Patient Details  Name: Amber Reilly MRN: [id number] Date of Birth: [birth year] Referring Provider (PT): Karle Starch, MD   Encounter Date: 04/25/2020   PT End of Session - 04/25/20 1444    Visit Number 15    Number of Visits 16    Authorization Type UHC    Authorization - Visit Number 6    Authorization - Number of Visits 30   has 30 visits per year   PT Start Time 1434    PT Stop Time 1520    PT Time Calculation (min) 46 min    Activity Tolerance Patient tolerated treatment well    Behavior During Therapy WFL for tasks assessed/performed           Past Medical History:  Diagnosis Date   Hypertension     Past Surgical History:  Procedure Laterality Date   CESAREAN SECTION     ENDOMETRIAL ABLATION     FOOT SURGERY Left    KNEE SURGERY Left    LAPAROTOMY N/A 11/26/2017   Procedure: EXPLORATORY LAPAROTOMY;  Surgeon: Adolphus Birchwood, MD;  Location: [facility];  Service: Gynecology;  Laterality: N/A;   UMBILICAL HERNIA REPAIR N/A 11/26/2017   Procedure: HERNIA REPAIR UMBILICAL ADULT;  Surgeon: Adolphus Birchwood, MD;  Location: [facility];  Service: Gynecology;  Laterality: N/A;   UNILATERAL SALPINGECTOMY Right 11/26/2017   Procedure: RIGHT SALPINGO-OOPHORECTOMY;  Surgeon: Adolphus Birchwood, MD;  Location: [facility];  Service: Gynecology;  Laterality: Right;    There were no vitals filed for this visit.   Subjective Assessment - 04/25/20 1446    Subjective Pt complains of stiffness in her Rt knee. Continued difficulty sleeping, "I can't put my leg on top of the other one" (is a side sleeper). She is concerned that she walks crooked.  She returns to surgeon on Friday.    Patient Stated Goals Have mobility back and improve mobility.    Currently in Pain? No/denies    Pain Score 0-No  pain              [facility] PT Assessment - 04/25/20 0001      Assessment   Medical Diagnosis R TKR    Referring Provider (PT) Karle Starch, MD    Onset Date/Surgical Date 01/31/20    Hand Dominance Right    Next MD Visit 04/27/20      Observation/Other Assessments   Focus on Therapeutic Outcomes (FOTO)  41% limited      AROM   Right Knee Extension -12    Right Knee Flexion 66      PROM   Right Knee Extension -9    Right Knee Flexion 75   on knee ext machine             [facility] Adult PT Treatment/Exercise - 04/25/20 0001      Knee/Hip Exercises: Stretches   Passive Hamstring Stretch Right;2 reps;30 seconds   overpressure from therapist.   Lobbyist Right;4 reps;30 seconds    Quad Stretch Limitations contract relax with  therapist over pressure    ITB Stretch Right;2 reps;30 seconds    Lawyer Limitations slant board stretch x 30 seconds    Other Knee/Hip Stretches knee to chest with passive overpressure from PTA; Pt sliding Rt  ft down wall with LLE for overpressure x 30 sec holds.    Other Knee/Hip Stretches Rt foot on step lunging forward for increased knee flexion      Knee/Hip Exercises: Aerobic   Stationary Bike L0 partial revolutions x 4 minutes    Nustep L3: 5 min for ROM      Knee/Hip Exercises: Machines for Strengthening   Cybex Knee Extension 4 reps of 30 sec, using wt of 2 plates pulling knee into ext      Manual Therapy   Manual therapy comments Rt patella mobilization sup/inf    Joint Mobilization grade II-III AP tibia on femur for flexion and femur on tibia for extension    Soft tissue mobilization STM to Rt hamstrings (mid to distal) and Rt lateral distal quad(limited tolerance)    Passive ROM into flexion and extension                    PT Short Term Goals - 04/25/20 1526      PT SHORT TERM GOAL #1   Title The patient will be indep with initial HEP.    Status Achieved      PT SHORT  TERM GOAL #2   Title The patient will improve R knee flexion to 80 degrees A/ROM    Baseline 66 deg; 75 deg AAROM using knee extension machine    Status On-going      PT SHORT TERM GOAL #3   Title The patient will improve AROM R knee to -9 degrees extension.    Baseline -12 deg    Status On-going      PT SHORT TERM GOAL #4   Title The patient will ambulate household distances without a device and improved knee flexion during gait cycle.    Baseline still limited with knee flexion during gait    Status Partially Met             PT Long Term Goals - 04/25/20 1539      PT LONG TERM GOAL #1   Title The patient will be indep with HEP.    Time 8    Period Weeks    Status On-going      PT LONG TERM GOAL #2   Title The patient will reduce limitation per FOTO to < or equal to 24% limitation.    Baseline 41% limited (unchanged from last assessment- 04/25/20)    Time 8    Period Weeks    Status On-going      PT LONG TERM GOAL #3   Title The patient will improve A/ROM R knee to 5 to 105 degrees.    Baseline 12-67 deg AROM;  9-75 deg PROM    Time 8    Period Weeks    Status On-going      PT LONG TERM GOAL #4   Title The patient will negotiate community surfaces including stairs, curbs, unlevel ground without a device independently x500 ft.    Baseline continued limitation with descending stairs and curbs.    Status Partially Met      PT LONG TERM GOAL #5   Title The patient will report R knee pain < or equal to 2/10.    Baseline 0/10 pain at rest; 5/10 pain with ROM exercises.    Status Partially Met            UPDATED LONG TERM GOALS:  PT Long Term Goals - 04/27/20 1315      PT  LONG TERM GOAL #1   Title The patient will be indep with progression of HEP.    Time 6    Period Weeks    Status Revised    Target Date 06/08/20      PT LONG TERM GOAL #2   Title The patient will reduce limitation per FOTO to < or equal to 24% limitation.    Time 6    Period Weeks     Status Revised    Target Date 06/08/20      PT LONG TERM GOAL #3   Title The patient will improve A/ROM R knee to 5 to 105 degrees.    Baseline 12-67 deg AROM;  9-75 deg PROM    Time 6    Period Weeks    Status Revised    Target Date 05/27/20      PT LONG TERM GOAL #4   Title The patient will negotiate 4 steps with one handrail and reciprocal pattern mod indep.    Time 6    Period Weeks    Status New    Target Date 06/08/20      PT LONG TERM GOAL #5   Title The patient will report R knee pain < or equal to 2/10.    Baseline 0/10 pain at rest; 5/10 pain with ROM exercises.    Time 6    Period Weeks    Status Revised    Target Date 06/08/20               Plan - 04/25/20 1552    Clinical Impression Statement Pt continues with limitations in Rt knee ROM both active and passive, limiting her normal functional movements and gait.  Persistent muscle guarding and tenderness in Rt quad and hamstring.  FOTO score remains unchanged.  Her ROM has remained in similar range over last month.  She has partially met her STG and LTGs.    Rehab Potential Good    PT Frequency 2x / week    PT Duration 8 weeks    PT Treatment/Interventions ADLs/Self Care Home Management;Vasopneumatic Device;Taping;Manual techniques;Therapeutic activities;Therapeutic exercise;Functional mobility training;Gait training;Stair training;Patient/family education;Electrical Stimulation;Moist Heat;Cryotherapy    PT Next Visit Plan continue aggressive ROM    PT Home Exercise Plan Access Code: [email or web address]           Patient will benefit from skilled therapeutic intervention in order to improve the following deficits and impairments:  Pain,Decreased range of motion,Abnormal gait,Decreased strength,Increased edema,Hypomobility,Impaired flexibility,Increased fascial restricitons  Visit Diagnosis: Acute pain of right knee  Muscle weakness (generalized)  Other abnormalities of gait and mobility     Problem  List Patient Active Problem List   Diagnosis Date Noted   Need for hepatitis C screening test 01/24/2020   Hyperglycemia 01/24/2020   Ovarian cyst 11/26/2017   Obesity (BMI 35.0-39.9 without comorbidity) 07/14/2017   Multinodular goiter 07/17/2016   Migraine 06/30/2016   Plantar fasciitis of left foot 09/16/2013   LBP (low back pain) 09/05/2013   Hypertension 04/05/2013   Vaginitis and vulvovaginitis 03/15/2013   Hydradenitis 08/20/2011    WEAVER,CHRISTINA, PT  Kerin Perna, PTA 04/25/20 3:57 PM  [place], [place], [address] Phone: [phone]   Fax:  [phone]  Name: Amber Reilly MRN: [id number] Date of Birth: 1965/05/02

## 2020-04-26 ENCOUNTER — Encounter: Payer: Self-pay | Admitting: Physical Therapy

## 2020-04-26 ENCOUNTER — Ambulatory Visit: Payer: 59 | Admitting: Physical Therapy

## 2020-04-26 DIAGNOSIS — M6281 Muscle weakness (generalized): Secondary | ICD-10-CM | POA: Diagnosis not present

## 2020-04-26 DIAGNOSIS — R2689 Other abnormalities of gait and mobility: Secondary | ICD-10-CM | POA: Diagnosis not present

## 2020-04-26 DIAGNOSIS — M25561 Pain in right knee: Secondary | ICD-10-CM | POA: Diagnosis not present

## 2020-04-26 NOTE — Therapy (Addendum)
[place], [place], [address] Phone: [phone]   Fax:  [phone]  Physical Therapy Treatment  Patient Details  Name: Amber Reilly MRN: [id number] Date of Birth: 02-25-1966 Referring Provider (PT): Tracie Harrier, MD   Encounter Date: 04/26/2020   PT End of Session - 04/26/20 1507    Visit Number [facility] - Visit Number 7    Authorization - Number of Visits 30   has 30 visits per year   PT Start Time 7371   MHP 10 min at beginning of session   PT Stop Time 1536    PT Time Calculation (min) 58 min    Activity Tolerance Patient limited by pain    Behavior During Therapy [facility] for tasks assessed/performed           Past Medical History:  Diagnosis Date  . Hypertension     Past Surgical History:  Procedure Laterality Date  . CESAREAN SECTION    . ENDOMETRIAL ABLATION    . FOOT SURGERY Left   . KNEE SURGERY Left   . LAPAROTOMY N/A 11/26/2017   Procedure: EXPLORATORY LAPAROTOMY;  Surgeon: [name], MD;  Location: [facility];  Service: Gynecology;  Laterality: N/A;  . UMBILICAL HERNIA REPAIR N/A 11/26/2017   Procedure: HERNIA REPAIR UMBILICAL ADULT;  Surgeon: [name], MD;  Location: [facility];  Service: Gynecology;  Laterality: N/A;  . UNILATERAL SALPINGECTOMY Right 11/26/2017   Procedure: RIGHT SALPINGO-OOPHORECTOMY;  Surgeon: [name], MD;  Location: [facility];  Service: Gynecology;  Laterality: Right;    There were no vitals filed for this visit.   Subjective Assessment - 04/26/20 1455    Subjective Pt reports she has been more mindful of how she is walking.  She had difficulty sleeping due to pain.    Patient Stated Goals Have mobility back and improve mobility.    Currently in Pain? Yes    Pain Score 5     Pain Location Knee    Pain Orientation Right    Pain Descriptors / Indicators Sore    Aggravating Factors  ROM exercises, sitting  too long, hurts at night    Pain Relieving Factors movement              [facility] PT Assessment - 04/26/20 0001      Assessment   Medical Diagnosis R TKR    Referring Provider (PT) Tracie Harrier, MD    Onset Date/Surgical Date 01/31/20    Hand Dominance Right    Next MD Visit 04/27/20            [facility] Adult PT Treatment/Exercise - 04/26/20 0001      Knee/Hip Exercises: Stretches   Passive Hamstring Stretch Right;2 reps;30 seconds    Knee: Self-Stretch to increase Flexion Right;3 reps;30 seconds    Knee: Self-Stretch Limitations Rt foot on low table leaing forward, Rt foot on 12" step with forward lunge    Gastroc Stretch Both;2 reps    Gastroc Stretch Limitations slant board stretch x 30 seconds    Other Knee/Hip Stretches PROM into Rt knee flexion with pt sitting on elevated table and leg on a pool noodle, with contract/ relax      Knee/Hip Exercises: Aerobic   Stationary Bike L0 partial revolutions x 62mn, and 2 additional minutes after PROM.    Nustep L3:4  min for ROM  Other Aerobic single laps around gym to assess response to exercise and decrease stiffness.      Knee/Hip Exercises: Machines for Strengthening   Cybex Knee Extension 5 reps of 30 sec, using wt of 2 plates pulling knee into ext      Knee/Hip Exercises: Standing   Lateral Step Up Right;1 set;Hand Hold: 2;Step Height: 4";Step Height: 6"   6 reps of each step height.   Forward Step Up Right;1 set;10 reps;Hand Hold: 2;Step Height: 6"   and retro step down   Forward Step Up Limitations requires increased time to complete; tactile cues on lower leg to avoid compensating at ankle/hip.    Step Down Left;Hand Hold: 2;Step Height: 4"   6 stairs.     Moist Heat Therapy   Number Minutes Moist Heat 10 Minutes   at beginning of treatment.   Moist Heat Location --   Rt quad/hamstring/calf     Manual Therapy   Manual therapy comments Rt patella mobilization sup/inf    Joint Mobilization grade II-III AP  tibia on femur for flexion and femur on tibia for extension    Passive ROM into flexion and extension                    PT Short Term Goals - 04/25/20 1526      PT SHORT TERM GOAL #1   Title The patient will be indep with initial HEP.    Status Achieved      PT SHORT TERM GOAL #2   Title The patient will improve R knee flexion to 80 degrees A/ROM    Baseline 66 deg; 75 deg AAROM using knee extension machine    Status On-going      PT SHORT TERM GOAL #3   Title The patient will improve AROM R knee to -9 degrees extension.    Baseline -12 deg    Status On-going      PT SHORT TERM GOAL #4   Title The patient will ambulate household distances without a device and improved knee flexion during gait cycle.    Baseline still limited with knee flexion during gait    Status Partially Met             PT Long Term Goals - 04/25/20 1539      PT LONG TERM GOAL #1   Title The patient will be indep with HEP.    Time 8    Period Weeks    Status On-going      PT LONG TERM GOAL #2   Title The patient will reduce limitation per FOTO to < or equal to 24% limitation.    Baseline 41% limited (unchanged from last assessment- 04/25/20)    Time 8    Period Weeks    Status On-going      PT LONG TERM GOAL #3   Title The patient will improve A/ROM R knee to 5 to 105 degrees.    Baseline 12-67 deg AROM;  9-75 deg PROM    Time 8    Period Weeks    Status On-going      PT LONG TERM GOAL #4   Title The patient will negotiate community surfaces including stairs, curbs, unlevel ground without a device independently x500 ft.    Baseline continued limitation with descending stairs and curbs.    Status Partially Met      PT LONG TERM GOAL #5   Title The patient will report R knee pain <  or equal to 2/10.    Baseline 0/10 pain at rest; 5/10 pain with ROM exercises.    Status Partially Met                 Plan - 04/26/20 1504    Clinical Impression Statement Pt arrives with  elevated pain level in Rt knee with decreased ROM, initially 60 deg knee flexion and improved to 70 deg P/ROM by end of session.  Pt with limited tolerance for ROM exercises today. She continues with compensatory strategies for ascending / descending steps.  Heavy encouragement provided throughout session. Pt participates well despite elevated pain level and remains motivated to progress towards remaining therapy goals.    Rehab Potential Good    PT Frequency 2x / week    PT Duration 8 weeks    PT Treatment/Interventions ADLs/Self Care Home Management;Vasopneumatic Device;Taping;Manual techniques;Therapeutic activities;Therapeutic exercise;Functional mobility training;Gait training;Stair training;Patient/family education;Electrical Stimulation;Moist Heat;Cryotherapy    PT Next Visit Plan continue aggressive ROM    PT Home Exercise Plan Access Code: [email or web address]           Patient will benefit from skilled therapeutic intervention in order to improve the following deficits and impairments:  Pain,Decreased range of motion,Abnormal gait,Decreased strength,Increased edema,Hypomobility,Impaired flexibility,Increased fascial restricitons  Visit Diagnosis: Acute pain of right knee  Muscle weakness (generalized)  Other abnormalities of gait and mobility     Problem List Patient Active Problem List   Diagnosis Date Noted  . Need for hepatitis C screening test 01/24/2020  . Hyperglycemia 01/24/2020  . Ovarian cyst 11/26/2017  . Obesity (BMI 35.0-39.9 without comorbidity) 07/14/2017  . Multinodular goiter 07/17/2016  . Migraine 06/30/2016  . Plantar fasciitis of left foot 09/16/2013  . LBP (low back pain) 09/05/2013  . Hypertension 04/05/2013  . Vaginitis and vulvovaginitis 03/15/2013  . Hydradenitis 08/20/2011   Kerin Perna, PTA 04/26/20 3:57 PM  [place], [place], [address] Phone: [phone]    Fax:  [phone]  Name: CHARISH SCHROEPFER MRN: [id number] Date of Birth: [birth year]

## 2020-04-27 NOTE — Addendum Note (Signed)
Addended by: Rudell Cobb M on: 04/27/2020 02:01 PM   Modules accepted: Orders

## 2020-04-27 NOTE — Addendum Note (Signed)
Addended by: Rudell Cobb M on: 04/27/2020 01:20 PM   Modules accepted: Orders

## 2020-04-30 ENCOUNTER — Encounter: Payer: Self-pay | Admitting: Rehabilitative and Restorative Service Providers"

## 2020-04-30 ENCOUNTER — Ambulatory Visit: Payer: 59 | Admitting: Rehabilitative and Restorative Service Providers"

## 2020-04-30 ENCOUNTER — Other Ambulatory Visit: Payer: Self-pay

## 2020-04-30 DIAGNOSIS — M6281 Muscle weakness (generalized): Secondary | ICD-10-CM | POA: Diagnosis not present

## 2020-04-30 DIAGNOSIS — M25561 Pain in right knee: Secondary | ICD-10-CM

## 2020-04-30 DIAGNOSIS — R2689 Other abnormalities of gait and mobility: Secondary | ICD-10-CM

## 2020-04-30 NOTE — Therapy (Signed)
[place], [place], [address] Phone: [phone]   Fax:  [phone]  Physical Therapy Treatment  Patient Details  Name: Amber Reilly MRN: [id number] Date of Birth: [birth year] Referring Provider (PT): Tracie Harrier, MD   Encounter Date: 04/30/2020   PT End of Session - 04/30/20 2159    Visit Number 17    Number of Visits 28    Date for PT Re-Evaluation 06/08/20    Authorization Type UHC    Authorization - Visit Number 8    Authorization - Number of Visits 30   has 30 visits per year   PT Start Time 1435    PT Stop Time 1518    PT Time Calculation (min) 43 min    Activity Tolerance Patient limited by pain    Behavior During Therapy [facility] for tasks assessed/performed           Past Medical History:  Diagnosis Date  . Hypertension     Past Surgical History:  Procedure Laterality Date  . CESAREAN SECTION    . ENDOMETRIAL ABLATION    . FOOT SURGERY Left   . KNEE SURGERY Left   . LAPAROTOMY N/A 11/26/2017   Procedure: EXPLORATORY LAPAROTOMY;  Surgeon: [name], MD;  Location: [facility];  Service: Gynecology;  Laterality: N/A;  . UMBILICAL HERNIA REPAIR N/A 11/26/2017   Procedure: HERNIA REPAIR UMBILICAL ADULT;  Surgeon: [name], MD;  Location: [facility];  Service: Gynecology;  Laterality: N/A;  . UNILATERAL SALPINGECTOMY Right 11/26/2017   Procedure: RIGHT SALPINGO-OOPHORECTOMY;  Surgeon: [name], MD;  Location: [facility];  Service: Gynecology;  Laterality: Right;    There were no vitals filed for this visit.   Subjective Assessment - 04/30/20 1445    Subjective The patient saw MD and is approved for dry needling.    Pertinent History HTN, h/o migraines    Patient Stated Goals Have mobility back and improve mobility.    Currently in Pain? Yes    Pain Location Knee    Pain Orientation Right;Medial    Pain Descriptors / Indicators Sore    Pain Type Surgical pain;Chronic pain     Pain Onset More than a month ago    Pain Frequency Intermittent    Aggravating Factors  ROM exercises, massage    Pain Relieving Factors gentle movement              [facility] PT Assessment - 04/30/20 1446      Assessment   Medical Diagnosis R TKR    Referring Provider (PT) Tracie Harrier, MD    Onset Date/Surgical Date 01/31/20    Hand Dominance Right                         [facility] Adult PT Treatment/Exercise - 04/30/20 1447      Exercises   Exercises Knee/Hip      Knee/Hip Exercises: Aerobic   Stationary Bike L0 partial revolutions x 36mn, and 2 additional minutes after PROM.      Knee/Hip Exercises: Prone   Hamstring Curl 1 set;10 reps    Prone Knee Hang 1 minute      Moist Heat Therapy   Number Minutes Moist Heat 10 Minutes    Moist Heat Location Knee   anterior/posterior     Manual Therapy   Manual Therapy Soft tissue mobilization    Manual therapy comments skilled palpation to  assess soft tissue response to DN.  Patient does not have muscle tightness in right quad, however has significant pain with palpation and trigger points in R medial hamstring    Joint Mobilization patellar mobilizaiton in superior and medial/lateral directions grade II-III    Soft tissue mobilization medial Hamstring    Passive ROM into flexion and extension with contract/relax and isometrics t/o ROM            Trigger Point Dry Needling - 04/30/20 1514    Consent Given? Yes    Education Handout Provided Yes   handout from [employer]   Muscles Treated Lower Quadrant Hamstring   medial   Dry Needling Comments along medial hamstrings performed DN and STM to release muscle guarding and trigger point    Hamstring Response Palpable increased muscle length                PT Education - 04/30/20 2158    Education Details handout on dry needling    Person(s) Educated Patient    Methods Explanation;Handout    Comprehension Verbalized understanding             PT Short Term Goals - 04/25/20 1526      PT SHORT TERM GOAL #1   Title The patient will be indep with initial HEP.    Status Achieved      PT SHORT TERM GOAL #2   Title The patient will improve R knee flexion to 80 degrees A/ROM    Baseline 66 deg; 75 deg AAROM using knee extension machine    Status On-going      PT SHORT TERM GOAL #3   Title The patient will improve AROM R knee to -9 degrees extension.    Baseline -12 deg    Status On-going      PT SHORT TERM GOAL #4   Title The patient will ambulate household distances without a device and improved knee flexion during gait cycle.    Baseline still limited with knee flexion during gait    Status Partially Met             PT Long Term Goals - 04/27/20 1315      PT LONG TERM GOAL #1   Title The patient will be indep with progression of HEP.    Time 6    Period Weeks    Status Revised    Target Date 06/08/20      PT LONG TERM GOAL #2   Title The patient will reduce limitation per FOTO to < or equal to 24% limitation.    Time 6    Period Weeks    Status Revised    Target Date 06/08/20      PT LONG TERM GOAL #3   Title The patient will improve A/ROM R knee to 5 to 105 degrees.    Baseline 12-67 deg AROM;  9-75 deg PROM    Time 6    Period Weeks    Status Revised    Target Date 05/27/20      PT LONG TERM GOAL #4   Title The patient will negotiate 4 steps with one handrail and reciprocal pattern mod indep.    Time 6    Period Weeks    Status New    Target Date 06/08/20      PT LONG TERM GOAL #5   Title The patient will report R knee pain < or equal to 2/10.    Baseline 0/10 pain  at rest; 5/10 pain with ROM exercises.    Time 6    Period Weeks    Status Revised    Target Date 06/08/20                 Plan - 04/30/20 2159    Clinical Impression Statement The patient continues with significant ROM limitations and is plateauing with progress in theapy.  PT focused on soft tissue mobilization,  patellar joint mobs, and active/passive stretching today.  Plan to determine if any gains with mobilization + STM today at next session.  If no improvements in ROM over next 1-2 sessions, feel we should work on d/c planning with home activities or trying aquatics.    PT Treatment/Interventions ADLs/Self Care Home Management;Vasopneumatic Device;Taping;Manual techniques;Therapeutic activities;Therapeutic exercise;Functional mobility training;Gait training;Stair training;Patient/family education;Electrical Stimulation;Moist Heat;Cryotherapy;Aquatic Therapy    PT Next Visit Plan how did patient tolerate STM and dry needling?  aggressive ROM, consider other interventions (aquatics?) and d/c    PT Home Exercise Plan Access Code: [email or web address]    Consulted and Agree with Plan of Care Patient           Patient will benefit from skilled therapeutic intervention in order to improve the following deficits and impairments:     Visit Diagnosis: Acute pain of right knee  Muscle weakness (generalized)  Other abnormalities of gait and mobility     Problem List Patient Active Problem List   Diagnosis Date Noted  . Need for hepatitis C screening test 01/24/2020  . Hyperglycemia 01/24/2020  . Ovarian cyst 11/26/2017  . Obesity (BMI 35.0-39.9 without comorbidity) 07/14/2017  . Multinodular goiter 07/17/2016  . Migraine 06/30/2016  . Plantar fasciitis of left foot 09/16/2013  . LBP (low back pain) 09/05/2013  . Hypertension 04/05/2013  . Vaginitis and vulvovaginitis 03/15/2013  . Hydradenitis 08/20/2011    Ferdinand, PT 04/30/2020, 10:06 PM  [facility] [place], [place], [address] Phone: [phone]   Fax:  [phone]  Name: SHAGUANA LOVE MRN: [id number] Date of Birth: 10-27-1965

## 2020-05-01 HISTORY — PX: CLOSED REDUCTION / MANIPULATION JOINT: SUR207

## 2020-05-04 ENCOUNTER — Other Ambulatory Visit: Payer: Self-pay

## 2020-05-04 ENCOUNTER — Ambulatory Visit: Payer: 59 | Admitting: Physical Therapy

## 2020-05-04 DIAGNOSIS — M25561 Pain in right knee: Secondary | ICD-10-CM

## 2020-05-04 DIAGNOSIS — M6281 Muscle weakness (generalized): Secondary | ICD-10-CM

## 2020-05-04 DIAGNOSIS — R2689 Other abnormalities of gait and mobility: Secondary | ICD-10-CM | POA: Diagnosis not present

## 2020-05-04 NOTE — Therapy (Signed)
[place], [place], [address] Phone: [phone]   Fax:  [phone]  Physical Therapy Treatment  Patient Details  Name: Amber Reilly MRN: [id number] Date of Birth: 1965-05-20 Referring Provider (PT): Tracie Harrier, MD   Encounter Date: 05/04/2020   PT End of Session - 05/04/20 1546    Visit Number 18    Number of Visits 28    Date for PT Re-Evaluation 06/08/20    Authorization Type UHC    Authorization - Visit Number 9    Authorization - Number of Visits 30   has 30 visits per year   PT Start Time 4097    PT Stop Time 1617    PT Time Calculation (min) 39 min    Activity Tolerance Patient limited by pain    Behavior During Therapy [facility] for tasks assessed/performed           Past Medical History:  Diagnosis Date  . Hypertension     Past Surgical History:  Procedure Laterality Date  . CESAREAN SECTION    . ENDOMETRIAL ABLATION    . FOOT SURGERY Left   . KNEE SURGERY Left   . LAPAROTOMY N/A 11/26/2017   Procedure: EXPLORATORY LAPAROTOMY;  Surgeon: [name], MD;  Location: [facility];  Service: Gynecology;  Laterality: N/A;  . UMBILICAL HERNIA REPAIR N/A 11/26/2017   Procedure: HERNIA REPAIR UMBILICAL ADULT;  Surgeon: [name], MD;  Location: [facility];  Service: Gynecology;  Laterality: N/A;  . UNILATERAL SALPINGECTOMY Right 11/26/2017   Procedure: RIGHT SALPINGO-OOPHORECTOMY;  Surgeon: [name], MD;  Location: [facility];  Service: Gynecology;  Laterality: Right;    There were no vitals filed for this visit.   Subjective Assessment - 05/04/20 1551    Subjective Pt reports she had some soreness from the DN.  She states she returns to MD 05/18/20- she states she had 80 deg of knee bend in MD office.    Currently in Pain? No/denies   just stiffness.             [facility] PT Assessment - 05/04/20 0001      Assessment   Medical Diagnosis R TKR    Referring Provider (PT) Tracie Harrier, MD    Onset Date/Surgical Date 01/31/20    Hand Dominance Right      PROM   Right Knee Extension -15    Right Knee Flexion 70             [facility] Adult PT Treatment/Exercise - 05/04/20 0001      Knee/Hip Exercises: Stretches   Passive Hamstring Stretch Right;2 reps;30 seconds   over pressure from pt   Quad Stretch Right;3 reps;30 seconds    Knee: Self-Stretch Limitations Rt foot on 12" step with forward lunge x 3 reps of 15; seated scoots x 30 sec x 3 reps. Stand to slowly sitting x 3 reps    Gastroc Stretch Right;2 reps;30 seconds      Knee/Hip Exercises: Aerobic   Recumbent Bike partial revolutions x 5 min with encouragement to push into the stretch.      Knee/Hip Exercises: Prone   Other Prone Exercises Rt TKE with toes tucked x 5 sec x 5 reps      Modalities   Modalities --   deferred; will ice/heat at home.     Manual Therapy   Soft tissue mobilization Rt hamstring and calf.    Passive ROM  Rt knee into flexion and ext.(pt in supine; knee flexion with hip flexed to 90 deg)                    PT Short Term Goals - 04/25/20 1526      PT SHORT TERM GOAL #1   Title The patient will be indep with initial HEP.    Status Achieved      PT SHORT TERM GOAL #2   Title The patient will improve R knee flexion to 80 degrees A/ROM    Baseline 66 deg; 75 deg AAROM using knee extension machine    Status On-going      PT SHORT TERM GOAL #3   Title The patient will improve AROM R knee to -9 degrees extension.    Baseline -12 deg    Status On-going      PT SHORT TERM GOAL #4   Title The patient will ambulate household distances without a device and improved knee flexion during gait cycle.    Baseline still limited with knee flexion during gait    Status Partially Met             PT Long Term Goals - 04/27/20 1315      PT LONG TERM GOAL #1   Title The patient will be indep with progression of HEP.    Time 6    Period Weeks    Status Revised     Target Date 06/08/20      PT LONG TERM GOAL #2   Title The patient will reduce limitation per FOTO to < or equal to 24% limitation.    Time 6    Period Weeks    Status Revised    Target Date 06/08/20      PT LONG TERM GOAL #3   Title The patient will improve A/ROM R knee to 5 to 105 degrees.    Baseline 12-67 deg AROM;  9-75 deg PROM    Time 6    Period Weeks    Status Revised    Target Date 05/27/20      PT LONG TERM GOAL #4   Title The patient will negotiate 4 steps with one handrail and reciprocal pattern mod indep.    Time 6    Period Weeks    Status New    Target Date 06/08/20      PT LONG TERM GOAL #5   Title The patient will report R knee pain < or equal to 2/10.    Baseline 0/10 pain at rest; 5/10 pain with ROM exercises.    Time 6    Period Weeks    Status Revised    Target Date 06/08/20                 Plan - 05/04/20 1624    Clinical Impression Statement Continued ROM limitations in Rt knee;  15-70 deg.  Limited tolerance for PROM from therapist due to increased medial Rt knee pain.  Pt verbalized desire to call surgeon and request manipulation. Will discuss hold vs d/c at next visit due to limited progress.    Rehab Potential Good    PT Frequency 2x / week    PT Duration 8 weeks    PT Treatment/Interventions ADLs/Self Care Home Management;Vasopneumatic Device;Taping;Manual techniques;Therapeutic activities;Therapeutic exercise;Functional mobility training;Gait training;Stair training;Patient/family education;Electrical Stimulation;Moist Heat;Cryotherapy;Aquatic Therapy    PT Next Visit Plan aggressive ROM, consider other interventions (aquatics?) and d/c    PT Home Exercise  Plan Access Code: [email or web address]    Consulted and Agree with Plan of Care Patient           Patient will benefit from skilled therapeutic intervention in order to improve the following deficits and impairments:  Pain,Decreased range of motion,Abnormal gait,Decreased  strength,Increased edema,Hypomobility,Impaired flexibility,Increased fascial restricitons  Visit Diagnosis: Acute pain of right knee  Muscle weakness (generalized)  Other abnormalities of gait and mobility     Problem List Patient Active Problem List   Diagnosis Date Noted  . Need for hepatitis C screening test 01/24/2020  . Hyperglycemia 01/24/2020  . Ovarian cyst 11/26/2017  . Obesity (BMI 35.0-39.9 without comorbidity) 07/14/2017  . Multinodular goiter 07/17/2016  . Migraine 06/30/2016  . Plantar fasciitis of left foot 09/16/2013  . LBP (low back pain) 09/05/2013  . Hypertension 04/05/2013  . Vaginitis and vulvovaginitis 03/15/2013  . Hydradenitis 08/20/2011   Kerin Perna, PTA 05/04/20 4:29 PM  [place], [place], [address] Phone: [phone]   Fax:  [phone]  Name: Amber Reilly MRN: [id number] Date of Birth: 04/25/1965

## 2020-05-07 ENCOUNTER — Encounter: Payer: 59 | Admitting: Rehabilitative and Restorative Service Providers"

## 2020-05-10 ENCOUNTER — Encounter: Payer: 59 | Admitting: Physical Therapy

## 2020-05-17 ENCOUNTER — Other Ambulatory Visit: Payer: Self-pay

## 2020-05-17 ENCOUNTER — Ambulatory Visit (INDEPENDENT_AMBULATORY_CARE_PROVIDER_SITE_OTHER): Payer: 59 | Admitting: Rehabilitative and Restorative Service Providers"

## 2020-05-17 DIAGNOSIS — M6281 Muscle weakness (generalized): Secondary | ICD-10-CM | POA: Diagnosis not present

## 2020-05-17 DIAGNOSIS — M25561 Pain in right knee: Secondary | ICD-10-CM | POA: Diagnosis not present

## 2020-05-17 DIAGNOSIS — R2689 Other abnormalities of gait and mobility: Secondary | ICD-10-CM

## 2020-05-17 NOTE — Patient Instructions (Signed)
Access Code: [email or web address] URL: [email or web address] Date: 05/17/2020 Prepared by: Rudell Cobb  Exercises Prone Knee Extension Hang - 2 x daily - 7 x weekly - 1 sets - 1 reps - 2-5 minutes hold Quad Setting and Stretching - 2 x daily - 7 x weekly - 1 sets - 10 reps Supine Heel Slide with Strap - 2 x daily - 7 x weekly - 1 sets - 10 reps Seated Heel Slide - 2 x daily - 7 x weekly - 1 sets - 10 reps Seated Hamstring Stretch with Chair - 2 x daily - 7 x weekly - 1 sets - 10 reps Gastroc Stretch on Wall - 2 x daily - 7 x weekly - 1 sets - 3 reps - 30 seconds hold Long Sitting 4 Way Patellar Glide - 2 x daily - 7 x weekly - 1 sets - 10 reps  Patient Education Scar Massage

## 2020-05-17 NOTE — Therapy (Signed)
[place], [place], [address] Phone: [phone]   Fax:  [phone]  Physical Therapy Treatment and Re-evalutaion  Patient Details  Name: Amber Reilly MRN: [id number] Date of Birth: February 16, 1966 Referring Provider (PT): Tracie Harrier, MD   Encounter Date: 05/17/2020   PT End of Session - 05/17/20 1601    Visit Number 19    Number of Visits 28    Date for PT Re-Evaluation 06/28/20    Authorization Type UHC    Authorization - Visit Number 9    Authorization - Number of Visits 30   has 30 visits per year   Activity Tolerance Patient limited by pain    Behavior During Therapy [facility] for tasks assessed/performed           Past Medical History:  Diagnosis Date  . Hypertension     Past Surgical History:  Procedure Laterality Date  . CESAREAN SECTION    . ENDOMETRIAL ABLATION    . FOOT SURGERY Left   . KNEE SURGERY Left   . LAPAROTOMY N/A 11/26/2017   Procedure: EXPLORATORY LAPAROTOMY;  Surgeon: [name], MD;  [facility];  Service: Gynecology;  Laterality: N/A;  . UMBILICAL HERNIA REPAIR N/A 11/26/2017   Procedure: HERNIA REPAIR UMBILICAL ADULT;  Surgeon: [name], MD;  [facility];  Service: Gynecology;  Laterality: N/A;  . UNILATERAL SALPINGECTOMY Right 11/26/2017   Procedure: RIGHT SALPINGO-OOPHORECTOMY;  Surgeon: [name], MD;  Location: [facility];  Service: Gynecology;  Laterality: Right;    There were no vitals filed for this visit.   Subjective Assessment - 05/17/20 1459    Subjective The patient reports her knee feels looser and more mobile since manipulation.  It is sore today with swelling.  "I honestly don't have pain, it's just the soreness".    Pertinent History HTN, h/o migraines    Patient Stated Goals Have mobility back and improve mobility.    Currently in Pain? No/denies              [facility] PT Assessment - 05/17/20 1540      Assessment    Medical Diagnosis R TKR s/p manipulation under anesthesia 05/16/20    Referring Provider (PT) Tracie Harrier, MD    Onset Date/Surgical Date --   manipulation on 05/16/20     AROM   Overall AROM  Deficits    Right Knee Extension -18    Right Knee Flexion 70      PROM   Overall PROM  Deficits    Right Knee Extension -16    Right Knee Flexion 78      Strength   Overall Strength Comments 20 degree R knee extensor lag      Flexibility   Hamstrings pain and tightness R medial HS and bilatral gastroc    Quadriceps sore in quad today (medially)      Palpation   Patella mobility hypomobility in the patella      Ambulation/Gait   Ambulation/Gait Yes    Ambulation/Gait Assistance 7: Independent    Assistive device None    Gait Pattern Step-through pattern    Gait Comments patient maintains knee in a fixed position intiially with gait; she needs cues to flex knee to initiate swing phase of gait                         [facility] Adult PT Treatment/Exercise -  05/17/20 1540      Exercises   Exercises Knee/Hip      Knee/Hip Exercises: Stretches   Passive Hamstring Stretch Right;2 reps;30 seconds    Knee: Self-Stretch Limitations Rt foot on 12" step with forward lunge x 3 reps    Gastroc Stretch 1 rep;Both;60 seconds    Gastroc Stretch Limitations on slant board      Knee/Hip Exercises: Aerobic   Stationary Bike L0 partial revolutions x 70min, and 2 additional minutes after PROM.      Knee/Hip Exercises: Supine   Quad Sets Strengthening;Right;5 reps    Quad Sets Limitations with tactile cues    Other Supine Knee/Hip Exercises knee to chest with passive overpressure      Knee/Hip Exercises: Prone   Hamstring Curl 5 reps    Hamstring Curl Limitations with passive overpressure and contract/relax    Prone Knee Hang 1 minute      Modalities   Modalities Vasopneumatic      Vasopneumatic   Number Minutes Vasopneumatic  10 minutes    Vasopnuematic Location  Knee     Vasopneumatic Pressure Low    Vasopneumatic Temperature  34      Manual Therapy   Manual Therapy Soft tissue mobilization;Passive ROM    Soft tissue mobilization R hamstring, R gastroc    Passive ROM R knee flexion and extension                    PT Short Term Goals - 05/17/20 1608      PT SHORT TERM GOAL #1   Title n/a      PT SHORT TERM GOAL #2   Title n/a             PT Long Term Goals - 05/17/20 1601      PT LONG TERM GOAL #1   Title The patient will be indep with progression of HEP.    Time 6    Period Weeks    Status Revised    Target Date 06/28/20      PT LONG TERM GOAL #2   Title The patient will reduce limitation per FOTO to < or equal to 24% limitation.    Time 6    Period Weeks    Status Revised    Target Date 06/28/20      PT LONG TERM GOAL #3   Title The patient will improve A/ROM R knee to 10 to 100 degrees.    Time 6    Period Weeks    Status Revised    Target Date 06/28/20      PT LONG TERM GOAL #4   Title The patient will negotiate 4 steps with one handrail and reciprocal pattern mod indep.    Time 6    Period Weeks    Status Revised    Target Date 06/28/20                 Plan - 05/17/20 1602    Clinical Impression Statement The patient underwent manipulation under anesthesia on 05/16/20 and returns to PT for re-assessment.  She continues with limitations in ROM, myofascial tightness, gait abnormalities, weakness.  PT to address deficits focusing on gaining mobility.  Patient has dec'd tolerance for passive overpressure and soft tissue work to reduce tightness.  PT encouraging patient to participate in regular HEP and walking.    Personal Factors and Comorbidities Comorbidity 2    Comorbidities HTN, h/o migraines    Examination-Activity Limitations  Locomotion Level;Squat;Stairs;Stand    Examination-Participation Restrictions Community Activity;Driving;Cleaning    Stability/Clinical Decision Making Stable/Uncomplicated     Clinical Decision Making Low    Rehab Potential Good    PT Frequency 2x / week    PT Duration 6 weeks    PT Treatment/Interventions ADLs/Self Care Home Management;Vasopneumatic Device;Taping;Manual techniques;Therapeutic activities;Therapeutic exercise;Functional mobility training;Gait training;Stair training;Patient/family education;Electrical Stimulation;Moist Heat;Cryotherapy;Aquatic Therapy    PT Next Visit Plan aggressive ROM, consider other interventions (aquatics?)    PT Home Exercise Plan Access Code: [email or web address]    Consulted and Agree with Plan of Care Patient           Patient will benefit from skilled therapeutic intervention in order to improve the following deficits and impairments:  Pain,Decreased range of motion,Abnormal gait,Decreased strength,Increased edema,Hypomobility,Impaired flexibility,Increased fascial restricitons  Visit Diagnosis: Acute pain of right knee  Muscle weakness (generalized)  Other abnormalities of gait and mobility     Problem List Patient Active Problem List   Diagnosis Date Noted  . Need for hepatitis C screening test 01/24/2020  . Hyperglycemia 01/24/2020  . Ovarian cyst 11/26/2017  . Obesity (BMI 35.0-39.9 without comorbidity) 07/14/2017  . Multinodular goiter 07/17/2016  . Migraine 06/30/2016  . Plantar fasciitis of left foot 09/16/2013  . LBP (low back pain) 09/05/2013  . Hypertension 04/05/2013  . Vaginitis and vulvovaginitis 03/15/2013  . Hydradenitis 08/20/2011    [place], PT 05/17/2020, 4:09 PM  [facility] [place], [place], [address] Phone: [phone]   Fax:  [phone]  Name: MAILI SHUTTERS MRN: [id number] Date of Birth: 1965-04-24

## 2020-05-18 ENCOUNTER — Encounter: Payer: Self-pay | Admitting: Rehabilitative and Restorative Service Providers"

## 2020-05-18 ENCOUNTER — Ambulatory Visit (INDEPENDENT_AMBULATORY_CARE_PROVIDER_SITE_OTHER): Payer: 59 | Admitting: Rehabilitative and Restorative Service Providers"

## 2020-05-18 DIAGNOSIS — R2689 Other abnormalities of gait and mobility: Secondary | ICD-10-CM

## 2020-05-18 DIAGNOSIS — M6281 Muscle weakness (generalized): Secondary | ICD-10-CM | POA: Diagnosis not present

## 2020-05-18 DIAGNOSIS — M25561 Pain in right knee: Secondary | ICD-10-CM

## 2020-05-18 NOTE — Addendum Note (Signed)
Addended by: Rudell Cobb M on: 05/18/2020 01:50 PM   Modules accepted: Orders

## 2020-05-18 NOTE — Therapy (Signed)
[place], [place], [address] Phone: [phone]   Fax:  [phone]  Physical Therapy Treatment  Patient Details  Name: Amber Reilly MRN: [id number] Date of Birth: 11-Feb-1966 Referring Provider (PT): Tracie Harrier, MD   Encounter Date: 05/18/2020   PT End of Session - 05/18/20 1156    Visit Number 20    Number of Visits 28    Date for PT Re-Evaluation 06/28/20    Authorization Type UHC    Authorization - Visit Number 11    Authorization - Number of Visits 30   has 30 visits per year   PT Start Time 2330    PT Stop Time 1240    PT Time Calculation (min) 55 min    Activity Tolerance Patient limited by pain    Behavior During Therapy [facility] for tasks assessed/performed           Past Medical History:  Diagnosis Date  . Hypertension     Past Surgical History:  Procedure Laterality Date  . CESAREAN SECTION    . ENDOMETRIAL ABLATION    . FOOT SURGERY Left   . KNEE SURGERY Left   . LAPAROTOMY N/A 11/26/2017   Procedure: EXPLORATORY LAPAROTOMY;  Surgeon: [name], MD;  Location: [facility];  Service: Gynecology;  Laterality: N/A;  . UMBILICAL HERNIA REPAIR N/A 11/26/2017   Procedure: HERNIA REPAIR UMBILICAL ADULT;  Surgeon: [name], MD;  Location: [facility];  Service: Gynecology;  Laterality: N/A;  . UNILATERAL SALPINGECTOMY Right 11/26/2017   Procedure: RIGHT SALPINGO-OOPHORECTOMY;  Surgeon: [name], MD;  Location: [facility];  Service: Gynecology;  Laterality: Right;    There were no vitals filed for this visit.   Subjective Assessment - 05/18/20 1153    Subjective The patient is sore in medial quads today from manipulation and PT.    Pertinent History HTN, h/o migraines    Patient Stated Goals Have mobility back and improve mobility.    Currently in Pain? No/denies    Pain Descriptors / Indicators Sore;Tightness    Pain Type Surgical pain;Chronic pain    Pain Onset More than  a month ago    Pain Frequency Intermittent    Aggravating Factors  ROM exercise, massage    Pain Relieving Factors movement              [facility] PT Assessment - 05/18/20 1157      Assessment   Medical Diagnosis R TKR s/p manipulation under anesthesia 05/16/20    Referring Provider (PT) Tracie Harrier, MD    Onset Date/Surgical Date 01/31/20                         [facility] Adult PT Treatment/Exercise - 05/18/20 1158      Ambulation/Gait   Ambulation/Gait Yes    Ambulation/Gait Assistance 7: Independent    Stairs Yes    Stairs Assistance 6: Modified independent (Device/Increase time)    Stair Management Technique Two rails;Alternating pattern    Pre-Gait Activities stairs working on dec'ing R hip hike, keeping R heel on step and not rotating hips    Gait Comments emphasizing normalizing gait pattern and flexing knee to initiate swing phase      Exercises   Exercises Knee/Hip      Knee/Hip Exercises: Stretches   Passive Hamstring Stretch Right;1 rep;30 seconds    Quad Stretch Right;3 reps;30 seconds    Knee:  Self-Stretch to increase Flexion Right    Knee: Self-Stretch Limitations with hot pack at end of session in sustained wall stretch using AAROM (L foot) to gain greater ROM      Knee/Hip Exercises: Aerobic   Recumbent Bike partial revolutions x 5 minutes for ROM and wawrm up      Knee/Hip Exercises: Standing   Knee Flexion AROM;Right    Knee Flexion Limitations worked at 4" and 6" step on active knee flexion with cues to decrease hip hike      Knee/Hip Exercises: Supine   Quad Sets Strengthening;Right;10 reps    Straight Leg Raises Strengthening;Right;10 reps    Straight Leg Raises Limitations cues for quad set      Knee/Hip Exercises: Sidelying   Hip ABduction Strengthening;Right;15 reps    Hip ABduction Limitations 2 lbs      Knee/Hip Exercises: Prone   Hamstring Curl 5 reps    Hamstring Curl Limitations with passive overpressure and  contract/relax    Prone Knee Hang 1 minute    Prone Knee Hang Weights (lbs) with 5 lbs    Straight Leg Raises Strengthening;Right;10 reps      Modalities   Modalities Moist Heat      Moist Heat Therapy   Number Minutes Moist Heat 10 Minutes    Moist Heat Location Knee   during self stretch to gain ROM     Manual Therapy   Manual Therapy Passive ROM;Manual Traction;Joint mobilization    Manual therapy comments to reduce knee pain and improve motion    Joint Mobilization mobilization with movement using seat belt to facilitate knee flexion with R LE on 8" step and dec'ing compensatory movements of hip rotation and R hip hiking    Passive ROM overpressure into flexion and extension *for flexion * Patient did better today if she did deep breathing and told PT when she was ready for overpressure    Manual Traction traction at the knee using seat belt to gain mobilization with movement                    PT Short Term Goals - 05/17/20 1608      PT SHORT TERM GOAL #1   Title n/a      PT SHORT TERM GOAL #2   Title n/a             PT Long Term Goals - 05/17/20 1601      PT LONG TERM GOAL #1   Title The patient will be indep with progression of HEP.    Time 6    Period Weeks    Status Revised    Target Date 06/28/20      PT LONG TERM GOAL #2   Title The patient will reduce limitation per FOTO to < or equal to 24% limitation.    Time 6    Period Weeks    Status Revised    Target Date 06/28/20      PT LONG TERM GOAL #3   Title The patient will improve A/ROM R knee to 10 to 100 degrees.    Time 6    Period Weeks    Status Revised    Target Date 06/28/20      PT LONG TERM GOAL #4   Title The patient will negotiate 4 steps with one handrail and reciprocal pattern mod indep.    Time 6    Period Weeks    Status Revised    Target  Date 06/28/20                 Plan - 05/18/20 1342    Clinical Impression Statement The patient had significant improvement  today in therapy, with seat belt mobilization with movement giving her a "release" type feeling in the leg and improved ROM after.  She got to 83 degrees PROM today (foot sliding on wall with overpressure) with heat.  She has reached 82 degrees in prone knee flexion PROM and got to 75 degrees prone AROM.  This is significnat improvement.  We discussed plan to reduce frequency after next week due to insurance limitations.    PT Treatment/Interventions ADLs/Self Care Home Management;Vasopneumatic Device;Taping;Manual techniques;Therapeutic activities;Therapeutic exercise;Functional mobility training;Gait training;Stair training;Patient/family education;Electrical Stimulation;Moist Heat;Cryotherapy;Aquatic Therapy    PT Next Visit Plan ROM to patient tolerance, seat belt mobilization with movement, try distraction sitting on elevated mat with weight    PT Home Exercise Plan Access Code: [email or web address]    Consulted and Agree with Plan of Care Patient           Patient will benefit from skilled therapeutic intervention in order to improve the following deficits and impairments:     Visit Diagnosis: Acute pain of right knee  Muscle weakness (generalized)  Other abnormalities of gait and mobility     Problem List Patient Active Problem List   Diagnosis Date Noted  . Need for hepatitis C screening test 01/24/2020  . Hyperglycemia 01/24/2020  . Ovarian cyst 11/26/2017  . Obesity (BMI 35.0-39.9 without comorbidity) 07/14/2017  . Multinodular goiter 07/17/2016  . Migraine 06/30/2016  . Plantar fasciitis of left foot 09/16/2013  . LBP (low back pain) 09/05/2013  . Hypertension 04/05/2013  . Vaginitis and vulvovaginitis 03/15/2013  . Hydradenitis 08/20/2011    [place], PT 05/18/2020, 1:44 PM  [facility] [place], [place], [address] Phone: [phone]   Fax:  [phone]  Name: LISETH WANN MRN:  [id number] Date of Birth: Jan 01, 1966

## 2020-05-21 ENCOUNTER — Telehealth: Payer: Self-pay | Admitting: Physical Therapy

## 2020-05-21 ENCOUNTER — Ambulatory Visit (INDEPENDENT_AMBULATORY_CARE_PROVIDER_SITE_OTHER): Payer: 59 | Admitting: Physical Therapy

## 2020-05-21 ENCOUNTER — Encounter: Payer: Self-pay | Admitting: Physical Therapy

## 2020-05-21 ENCOUNTER — Other Ambulatory Visit: Payer: Self-pay

## 2020-05-21 DIAGNOSIS — R2689 Other abnormalities of gait and mobility: Secondary | ICD-10-CM | POA: Diagnosis not present

## 2020-05-21 DIAGNOSIS — M6281 Muscle weakness (generalized): Secondary | ICD-10-CM | POA: Diagnosis not present

## 2020-05-21 DIAGNOSIS — M25561 Pain in right knee: Secondary | ICD-10-CM | POA: Diagnosis not present

## 2020-05-21 NOTE — Telephone Encounter (Signed)
Spoke with Amber Reilly, she had her appointments time confused.  She came in at a later time for PT Amber Reilly, PT 05/21/20 12:29 PM

## 2020-05-21 NOTE — Therapy (Signed)
Reilly Reilly [facility], [place], [address] Phone: [phone]   Fax:  [phone]  Physical Therapy Treatment  Patient Details  Name: Reilly Reilly MRN: [id number] Date of Birth: 09-03-1965 Referring Provider (PT): Tracie Harrier, MD   Encounter Date: 05/21/2020   PT End of Session - 05/21/20 1137    Visit Number 21    Number of Visits 28    Date for PT Re-Evaluation 06/28/20    Authorization Type UHC    Authorization - Visit Number 12    Authorization - Number of Visits 30    PT Start Time 3299    PT Stop Time 1234    PT Time Calculation (min) 59 min    Activity Tolerance Patient tolerated treatment well    Behavior During Therapy WFL for tasks assessed/performed           Past Medical History:  Diagnosis Date   Hypertension     Past Surgical History:  Procedure Laterality Date   CESAREAN SECTION     ENDOMETRIAL ABLATION     FOOT SURGERY Left    KNEE SURGERY Left    LAPAROTOMY N/A 11/26/2017   Procedure: EXPLORATORY LAPAROTOMY;  Surgeon: Reilly Amber, MD;  Location: [facility];  Service: Gynecology;  Laterality: N/A;   UMBILICAL HERNIA REPAIR N/A 11/26/2017   Procedure: HERNIA REPAIR UMBILICAL ADULT;  Surgeon: Reilly Amber, MD;  Location: [facility];  Service: Gynecology;  Laterality: N/A;   UNILATERAL SALPINGECTOMY Right 11/26/2017   Procedure: RIGHT SALPINGO-OOPHORECTOMY;  Surgeon: Reilly Amber, MD;  Location: [facility];  Service: Gynecology;  Laterality: Right;    There were no vitals filed for this visit.   Subjective Assessment - 05/21/20 1137    Subjective Shaquia reports she is very stiff today,  does not want DN again    Patient Stated Goals Have mobility back and improve mobility.    Currently in Pain? No/denies              [facility] PT Assessment - 05/21/20 0001      Assessment   Medical Diagnosis R TKR s/p manipulation under anesthesia 05/16/20      AROM   Right Knee  Flexion 90   with heel slides and deep exhales     PROM   Right Knee Flexion 85   seated with FWD scoot, prone 78 pulling with strap                        [facility] Adult PT Treatment/Exercise - 05/21/20 0001      Knee/Hip Exercises: Stretches   Sports administrator Right;5 reps;60 seconds   with strap   Other Knee/Hip Stretches half kneel with Rt knee on pad, FWD/BWD rocks with passive 10 sec hold      Knee/Hip Exercises: Aerobic   Recumbent Bike partial revolutions x 10 minutes for ROM and wawrm up   VC to hold longer at end range     Moist Heat Therapy   Number Minutes Moist Heat 10 Minutes    Moist Heat Location Knee      Manual Therapy   Joint Mobilization mobilization with seatbelt seated, first with 5# wt on ankle for distraction then with foot stabilized on floor    Soft tissue mobilization IASTM to Rt quad and anterior tib - focus on VMO    Passive ROM Rt knee flexion with contract relax  PT Short Term Goals - 05/17/20 1608      PT SHORT TERM GOAL #1   Title n/a      PT SHORT TERM GOAL #2   Title n/a             PT Long Term Goals - 05/17/20 1601      PT LONG TERM GOAL #1   Title The patient will be indep with progression of HEP.    Time 6    Period Weeks    Status Revised    Target Date 06/28/20      PT LONG TERM GOAL #2   Title The patient will reduce limitation per FOTO to < or equal to 24% limitation.    Time 6    Period Weeks    Status Revised    Target Date 06/28/20      PT LONG TERM GOAL #3   Title The patient will improve A/ROM R knee to 10 to 100 degrees.    Time 6    Period Weeks    Status Revised    Target Date 06/28/20      PT LONG TERM GOAL #4   Title The patient will negotiate 4 steps with one handrail and reciprocal pattern mod indep.    Time 6    Period Weeks    Status Revised    Target Date 06/28/20                 Plan - 05/21/20 1224    Clinical Impression Statement Almas's Rt  knee ROM continues to improve.  Using heat and manual work on the quad to loosen this up.  She was able to pull back to 90 degrees in a heel slide using deep breathes to relax into the stretch.  She needs more IASTM or foam rolling to the Rt quad followed may functional weight bearing movements to improve bend.    Rehab Potential Good    PT Duration 6 weeks    PT Treatment/Interventions ADLs/Self Care Home Management;Vasopneumatic Device;Taping;Manual techniques;Therapeutic activities;Therapeutic exercise;Functional mobility training;Gait training;Stair training;Patient/family education;Electrical Stimulation;Moist Heat;Cryotherapy;Aquatic Therapy    PT Next Visit Plan ROM to patient tolerance, seat belt mobilization with movement, try distraction sitting on elevated mat with weight    PT Home Exercise Plan Access Code: [email or web address]    Consulted and Agree with Plan of Care Patient           Patient will benefit from skilled therapeutic intervention in order to improve the following deficits and impairments:  Pain,Decreased range of motion,Abnormal gait,Decreased strength,Increased edema,Hypomobility,Impaired flexibility,Increased fascial restricitons  Visit Diagnosis: Acute pain of right knee  Muscle weakness (generalized)  Other abnormalities of gait and mobility     Problem List Patient Active Problem List   Diagnosis Date Noted   Need for hepatitis C screening test 01/24/2020   Hyperglycemia 01/24/2020   Ovarian cyst 11/26/2017   Obesity (BMI 35.0-39.9 without comorbidity) 07/14/2017   Multinodular goiter 07/17/2016   Migraine 06/30/2016   Plantar fasciitis of left foot 09/16/2013   LBP (low back pain) 09/05/2013   Hypertension 04/05/2013   Vaginitis and vulvovaginitis 03/15/2013   Hydradenitis 08/20/2011    Jeral Pinch PT  05/21/2020, 12:27 PM  [place], [place], [address] Phone:  [phone]   Fax:  [phone]  Name: Reilly Reilly MRN: [id number] Date of Birth: 1965/10/15

## 2020-05-22 ENCOUNTER — Ambulatory Visit (INDEPENDENT_AMBULATORY_CARE_PROVIDER_SITE_OTHER): Payer: 59 | Admitting: Rehabilitative and Restorative Service Providers"

## 2020-05-22 DIAGNOSIS — R2689 Other abnormalities of gait and mobility: Secondary | ICD-10-CM | POA: Diagnosis not present

## 2020-05-22 DIAGNOSIS — M25561 Pain in right knee: Secondary | ICD-10-CM

## 2020-05-22 DIAGNOSIS — M6281 Muscle weakness (generalized): Secondary | ICD-10-CM | POA: Diagnosis not present

## 2020-05-22 NOTE — Therapy (Signed)
[place], [place], [address] Phone: [phone]   Fax:  [phone]  Physical Therapy Treatment  Patient Details  Name: Amber Reilly MRN: [id number] Date of Birth: 22-Jul-1965 Referring Provider (PT): Tracie Harrier, MD   Encounter Date: 05/22/2020   PT End of Session - 05/22/20 0848    Visit Number 22    Number of Visits 28    Date for PT Re-Evaluation 06/28/20    Authorization Type UHC    Authorization - Visit Number 13    Authorization - Number of Visits 30    PT Start Time 0845    PT Stop Time 0930    PT Time Calculation (min) 45 min    Activity Tolerance Patient tolerated treatment well    Behavior During Therapy [facility] for tasks assessed/performed           Past Medical History:  Diagnosis Date  . Hypertension     Past Surgical History:  Procedure Laterality Date  . CESAREAN SECTION    . ENDOMETRIAL ABLATION    . FOOT SURGERY Left   . KNEE SURGERY Left   . LAPAROTOMY N/A 11/26/2017   Procedure: EXPLORATORY LAPAROTOMY;  Surgeon: [name], MD;  Location: [facility];  Service: Gynecology;  Laterality: N/A;  . UMBILICAL HERNIA REPAIR N/A 11/26/2017   Procedure: HERNIA REPAIR UMBILICAL ADULT;  Surgeon: [name], MD;  [facility]: [facility];  Service: Gynecology;  Laterality: N/A;  . UNILATERAL SALPINGECTOMY Right 11/26/2017   Procedure: RIGHT SALPINGO-OOPHORECTOMY;  Surgeon: [name], MD;  Location: [facility];  Service: Gynecology;  Laterality: Right;    There were no vitals filed for this visit.   Subjective Assessment - 05/22/20 0847    Subjective The patient reports she is achy in the medial knee today.    Pertinent History HTN, h/o migraines    Patient Stated Goals Have mobility back and improve mobility.    Currently in Pain? No/denies              [facility] PT Assessment - 05/22/20 5361      Assessment   Medical Diagnosis R TKR s/p manipulation under anesthesia  05/16/20    Referring Provider (PT) Tracie Harrier, MD    Onset Date/Surgical Date 01/31/20    Hand Dominance Right      AROM   Right Knee Extension -8      PROM   Right Knee Flexion 93   with knee to chest + overpressure; 78 prone with strap     Strength   Overall Strength Comments 15 degree extensor lag                         [facility] Adult PT Treatment/Exercise - 05/22/20 4431      Ambulation/Gait   Ambulation/Gait Yes    Ambulation/Gait Assistance 7: Independent    Stairs Yes    Stairs Assistance 6: Modified independent (Device/Increase time)    Stair Management Technique Two rails;Alternating pattern    Pre-Gait Activities working on mechanics on steps    Gait Comments gait cues to hit terminal stance emphasizing knee extension with heel still in contact with the flor      Exercises   Exercises Knee/Hip      Knee/Hip Exercises: Stretches   Passive Hamstring Stretch Right;2 reps;30 seconds    Quad Stretch Right;3 reps;30 seconds    Knee: Self-Stretch to increase Flexion  Right    Gastroc Stretch 2 reps;60 seconds    Gastroc Stretch Limitations slant board    Soleus Stretch 1 rep;30 seconds;Right      Knee/Hip Exercises: Aerobic   Recumbent Bike partial revolutions x 5 minutes for warm up      Manual Therapy   Manual Therapy Passive ROM;Manual Traction;Joint mobilization    Joint Mobilization mobilization with seatbelt seated, first with 5# wt on ankle for distraction then with foot stabilized on floor    Soft tissue mobilization Gastroc and hamstring STM    Passive ROM Rt knee flexion with contract relax    Manual Traction traction at the knee using seat belt to gain mobilization with movement                    PT Short Term Goals - 05/17/20 1608      PT SHORT TERM GOAL #1   Title n/a      PT SHORT TERM GOAL #2   Title n/a             PT Long Term Goals - 05/17/20 1601      PT LONG TERM GOAL #1   Title The patient  will be indep with progression of HEP.    Time 6    Period Weeks    Status Revised    Target Date 06/28/20      PT LONG TERM GOAL #2   Title The patient will reduce limitation per FOTO to < or equal to 24% limitation.    Time 6    Period Weeks    Status Revised    Target Date 06/28/20      PT LONG TERM GOAL #3   Title The patient will improve A/ROM R knee to 10 to 100 degrees.    Time 6    Period Weeks    Status Revised    Target Date 06/28/20      PT LONG TERM GOAL #4   Title The patient will negotiate 4 steps with one handrail and reciprocal pattern mod indep.    Time 6    Period Weeks    Status Revised    Target Date 06/28/20                 Plan - 05/22/20 3354    Clinical Impression Statement The patient is gaining ROM today getting up to 93 degrees after self stretching and adding passive overpressure.  PT to continue emphasizing self ROM, passive overpressure, and joint mobility.    Rehab Potential Good    PT Duration 6 weeks    PT Treatment/Interventions ADLs/Self Care Home Management;Vasopneumatic Device;Taping;Manual techniques;Therapeutic activities;Therapeutic exercise;Functional mobility training;Gait training;Stair training;Patient/family education;Electrical Stimulation;Moist Heat;Cryotherapy;Aquatic Therapy    PT Next Visit Plan ROM to patient tolerance, seat belt mobilization with movement, try distraction sitting on elevated mat with weight    PT Home Exercise Plan Access Code: [email or web address]    Consulted and Agree with Plan of Care Patient           Patient will benefit from skilled therapeutic intervention in order to improve the following deficits and impairments:  Pain,Decreased range of motion,Abnormal gait,Decreased strength,Increased edema,Hypomobility,Impaired flexibility,Increased fascial restricitons  Visit Diagnosis: Acute pain of right knee  Muscle weakness (generalized)  Other abnormalities of gait and mobility     Problem  List Patient Active Problem List   Diagnosis Date Noted  . Need for hepatitis C screening test 01/24/2020  . Hyperglycemia 01/24/2020  .  Ovarian cyst 11/26/2017  . Obesity (BMI 35.0-39.9 without comorbidity) 07/14/2017  . Multinodular goiter 07/17/2016  . Migraine 06/30/2016  . Plantar fasciitis of left foot 09/16/2013  . LBP (low back pain) 09/05/2013  . Hypertension 04/05/2013  . Vaginitis and vulvovaginitis 03/15/2013  . Hydradenitis 08/20/2011    Caylea Foronda, PT 05/22/2020, 9:30 AM  [facility] [place], [place], [address] Phone: [phone]   Fax:  [phone]  Name: Amber Reilly MRN: [id number] Date of Birth: 05-Jun-1965

## 2020-05-23 ENCOUNTER — Other Ambulatory Visit: Payer: Self-pay

## 2020-05-23 ENCOUNTER — Ambulatory Visit (INDEPENDENT_AMBULATORY_CARE_PROVIDER_SITE_OTHER): Payer: 59 | Admitting: Physical Therapy

## 2020-05-23 DIAGNOSIS — M6281 Muscle weakness (generalized): Secondary | ICD-10-CM | POA: Diagnosis not present

## 2020-05-23 DIAGNOSIS — R2689 Other abnormalities of gait and mobility: Secondary | ICD-10-CM

## 2020-05-23 DIAGNOSIS — M25561 Pain in right knee: Secondary | ICD-10-CM

## 2020-05-23 NOTE — Therapy (Signed)
[place], [place], [address] Phone: [phone]   Fax:  [phone]  Physical Therapy Treatment  Patient Details  Name: Amber Reilly MRN: [id number] Date of Birth: 01/06/1966 Referring Provider (PT): Tracie Harrier, MD   Encounter Date: 05/23/2020   PT End of Session - 05/23/20 1557    Visit Number 23    Number of Visits 28    Date for PT Re-Evaluation 06/28/20    Authorization - Visit Number 14    Authorization - Number of Visits 30    PT Start Time 1510    PT Stop Time 1600    PT Time Calculation (min) 50 min    Activity Tolerance Patient tolerated treatment well    Behavior During Therapy [facility] for tasks assessed/performed           Past Medical History:  Diagnosis Date  . Hypertension     Past Surgical History:  Procedure Laterality Date  . CESAREAN SECTION    . ENDOMETRIAL ABLATION    . FOOT SURGERY Left   . KNEE SURGERY Left   . LAPAROTOMY N/A 11/26/2017   Procedure: EXPLORATORY LAPAROTOMY;  Surgeon: [name], MD;  [facility]: [facility];  Service: Gynecology;  Laterality: N/A;  . UMBILICAL HERNIA REPAIR N/A 11/26/2017   Procedure: HERNIA REPAIR UMBILICAL ADULT;  Surgeon: [name], MD;  [facility]: [facility];  Service: Gynecology;  Laterality: N/A;  . UNILATERAL SALPINGECTOMY Right 11/26/2017   Procedure: RIGHT SALPINGO-OOPHORECTOMY;  Surgeon: [name], MD;  [facility]: [facility];  Service: Gynecology;  Laterality: Right;    There were no vitals filed for this visit.   Subjective Assessment - 05/23/20 1518    Subjective Pt reports she feels "good" today    Patient Stated Goals Have mobility back and improve mobility.    Currently in Pain? No/denies                             [facility] Adult PT Treatment/Exercise - 05/23/20 0001      Ambulation/Gait   Stair Management Technique Alternating pattern;Two rails   x 10 with focus on knee flexion with  descending stairs   Gait Comments cues for terminal knee extension      Knee/Hip Exercises: Stretches   Passive Hamstring Stretch Right;2 reps;30 seconds    Quad Stretch Right;3 reps;30 seconds    Knee: Self-Stretch to increase Flexion Right    Gastroc Stretch 2 reps;60 seconds    Gastroc Stretch Limitations slant board    Soleus Stretch 1 rep;30 seconds      Knee/Hip Exercises: Aerobic   Recumbent Bike partial revolutions x 5 min for warm up   full revolutions at the end x 5 min     Manual Therapy   Joint Mobilization 5# wt for Rt knee distraction    Soft tissue mobilization STM hamstring and gastroc    Passive ROM contract/relax Rt knee flexion                    PT Short Term Goals - 05/17/20 1608      PT SHORT TERM GOAL #1   Title n/a      PT SHORT TERM GOAL #2   Title n/a             PT Long Term Goals - 05/17/20 1601      PT LONG TERM GOAL #1  Title The patient will be indep with progression of HEP.    Time 6    Period Weeks    Status Revised    Target Date 06/28/20      PT LONG TERM GOAL #2   Title The patient will reduce limitation per FOTO to < or equal to 24% limitation.    Time 6    Period Weeks    Status Revised    Target Date 06/28/20      PT LONG TERM GOAL #3   Title The patient will improve A/ROM R knee to 10 to 100 degrees.    Time 6    Period Weeks    Status Revised    Target Date 06/28/20      PT LONG TERM GOAL #4   Title The patient will negotiate 4 steps with one handrail and reciprocal pattern mod indep.    Time 6    Period Weeks    Status Revised    Target Date 06/28/20                 Plan - 05/23/20 1559    Clinical Impression Statement Pt continues to gain ROM and is up to 90 degrees AROM, 92 with overpressure. Continue to focus on increasing ROM with jt mobs and self ROM    PT Next Visit Plan ROM to patient tolerance, seat belt mobilization with movement    PT Home Exercise Plan Access Code: [email or web address]     Consulted and Agree with Plan of Care Patient           Patient will benefit from skilled therapeutic intervention in order to improve the following deficits and impairments:     Visit Diagnosis: Acute pain of right knee  Muscle weakness (generalized)  Other abnormalities of gait and mobility     Problem List Patient Active Problem List   Diagnosis Date Noted  . Need for hepatitis C screening test 01/24/2020  . Hyperglycemia 01/24/2020  . Ovarian cyst 11/26/2017  . Obesity (BMI 35.0-39.9 without comorbidity) 07/14/2017  . Multinodular goiter 07/17/2016  . Migraine 06/30/2016  . Plantar fasciitis of left foot 09/16/2013  . LBP (low back pain) 09/05/2013  . Hypertension 04/05/2013  . Vaginitis and vulvovaginitis 03/15/2013  . Hydradenitis 08/20/2011   Obaloluwa Delatte, PT  Camdan Burdi 05/23/2020, 4:01 PM  [facility] [place], [place], [address] Phone: [phone]   Fax:  [phone]  Name: TEKEYA GEFFERT MRN: [id number] Date of Birth: June 22, 1965

## 2020-05-24 ENCOUNTER — Ambulatory Visit (INDEPENDENT_AMBULATORY_CARE_PROVIDER_SITE_OTHER): Payer: 59 | Admitting: Physical Therapy

## 2020-05-24 ENCOUNTER — Encounter: Payer: Self-pay | Admitting: Physical Therapy

## 2020-05-24 DIAGNOSIS — M6281 Muscle weakness (generalized): Secondary | ICD-10-CM

## 2020-05-24 DIAGNOSIS — R2689 Other abnormalities of gait and mobility: Secondary | ICD-10-CM

## 2020-05-24 DIAGNOSIS — M25561 Pain in right knee: Secondary | ICD-10-CM

## 2020-05-24 NOTE — Therapy (Signed)
[place], [place], [address] Phone: [phone]   Fax:  [phone]  Physical Therapy Treatment  Patient Details  Name: Amber Reilly MRN: [id number] Date of Birth: [birth year] Referring Provider (PT): Tracie Harrier, MD   Encounter Date: 05/24/2020   PT End of Session - 05/24/20 1722    Visit Number 24    Number of Visits 28    Date for PT Re-Evaluation 06/28/20    Authorization - Visit Number 15    Authorization - Number of Visits 30    PT Start Time 9417   pt arrived late   PT Stop Time 1701    PT Time Calculation (min) 36 min    Activity Tolerance Patient tolerated treatment well    Behavior During Therapy [facility] for tasks assessed/performed           Past Medical History:  Diagnosis Date  . Hypertension     Past Surgical History:  Procedure Laterality Date  . CESAREAN SECTION    . ENDOMETRIAL ABLATION    . FOOT SURGERY Left   . KNEE SURGERY Left   . LAPAROTOMY N/A 11/26/2017   Procedure: EXPLORATORY LAPAROTOMY;  Surgeon: [name], MD;  [facility]: [facility];  Service: Gynecology;  Laterality: N/A;  . UMBILICAL HERNIA REPAIR N/A 11/26/2017   Procedure: HERNIA REPAIR UMBILICAL ADULT;  Surgeon: [name], MD;  [facility];  Service: Gynecology;  Laterality: N/A;  . UNILATERAL SALPINGECTOMY Right 11/26/2017   Procedure: RIGHT SALPINGO-OOPHORECTOMY;  Surgeon: [name], MD;  [facility];  Service: Gynecology;  Laterality: Right;    There were no vitals filed for this visit.   Subjective Assessment - 05/24/20 1628    Subjective "I feel like I've made so much progress since the manipulation".  Pt reports she is doing the stairs (forward ) every two hrs. She has started to come down stairs (with LLE) but is going down sideways.    Currently in Pain? No/denies    Pain Score 0-No pain              [facility] PT Assessment - 05/24/20 0001      Assessment   Medical  Diagnosis R TKR s/p manipulation under anesthesia 05/16/20    Referring Provider (PT) Tracie Harrier, MD    Onset Date/Surgical Date 01/31/20    Hand Dominance Right    Next MD Visit 05/28/20      PROM   Right Knee Flexion 93            [facility] Adult PT Treatment/Exercise - 05/24/20 0001      Knee/Hip Exercises: Stretches   Passive Hamstring Stretch Right;3 reps;30 seconds    Quad Stretch Right;3 reps;30 seconds   prone   Knee: Self-Stretch to increase Flexion Right    Knee: Self-Stretch Limitations Rt knee to chest with strap and opp LE assist    Other Knee/Hip Stretches foot on low table with forward lunge.   Lt kneeling with forward lunge for Rt knee flexion, therapist assist.    Other Knee/Hip Stretches seated scoot x 5 reps of 20 sec      Knee/Hip Exercises: Aerobic   Recumbent Bike partial revolutions (forward/backward) to slow full revolutions    Nustep L5: 4 min for warm up and ROM      Knee/Hip Exercises: Seated   Long Arc Quad AROM;Right;2 sets;5 reps      Knee/Hip Exercises: Supine  Heel Slides AAROM;Right;1 set;5 reps   strap assist     Manual Therapy   Passive ROM contract/relax Rt knee flexion.  Rt knee ext with over pressure.                    PT Short Term Goals - 05/17/20 1608      PT SHORT TERM GOAL #1   Title n/a      PT SHORT TERM GOAL #2   Title n/a             PT Long Term Goals - 05/17/20 1601      PT LONG TERM GOAL #1   Title The patient will be indep with progression of HEP.    Time 6    Period Weeks    Status Revised    Target Date 06/28/20      PT LONG TERM GOAL #2   Title The patient will reduce limitation per FOTO to < or equal to 24% limitation.    Time 6    Period Weeks    Status Revised    Target Date 06/28/20      PT LONG TERM GOAL #3   Title The patient will improve A/ROM R knee to 10 to 100 degrees.    Time 6    Period Weeks    Status Revised    Target Date 06/28/20      PT LONG TERM GOAL #4    Title The patient will negotiate 4 steps with one handrail and reciprocal pattern mod indep.    Time 6    Period Weeks    Status Revised    Target Date 06/28/20                 Plan - 05/24/20 1658    Clinical Impression Statement Rt knee PROM to 93 deg; pt able to complete full slow revolutions (forward and backward) on bicycle.  Encouraged pt to continue focus on both flexion and ext of Rt knee for improved functional mobility.    Rehab Potential Good    PT Frequency 5x / week    PT Duration 6 weeks    PT Treatment/Interventions ADLs/Self Care Home Management;Vasopneumatic Device;Taping;Manual techniques;Therapeutic activities;Therapeutic exercise;Functional mobility training;Gait training;Stair training;Patient/family education;Electrical Stimulation;Moist Heat;Cryotherapy;Aquatic Therapy    PT Next Visit Plan ROM to patient tolerance, seat belt mobilization with movement.   MD note.    PT Home Exercise Plan Access Code: [email or web address]    Consulted and Agree with Plan of Care Patient           Patient will benefit from skilled therapeutic intervention in order to improve the following deficits and impairments:  Pain,Decreased range of motion,Abnormal gait,Decreased strength,Increased edema,Hypomobility,Impaired flexibility,Increased fascial restricitons  Visit Diagnosis: Acute pain of right knee  Muscle weakness (generalized)  Other abnormalities of gait and mobility     Problem List Patient Active Problem List   Diagnosis Date Noted  . Need for hepatitis C screening test 01/24/2020  . Hyperglycemia 01/24/2020  . Ovarian cyst 11/26/2017  . Obesity (BMI 35.0-39.9 without comorbidity) 07/14/2017  . Multinodular goiter 07/17/2016  . Migraine 06/30/2016  . Plantar fasciitis of left foot 09/16/2013  . LBP (low back pain) 09/05/2013  . Hypertension 04/05/2013  . Vaginitis and vulvovaginitis 03/15/2013  . Hydradenitis 08/20/2011   Kerin Perna, PTA 05/24/20  5:26 PM  [place], [place], [address] Phone: [phone]   Fax:  [phone]  Name: Amber Reilly MRN: [id number] Date of Birth: 1965/09/05

## 2020-05-25 ENCOUNTER — Other Ambulatory Visit: Payer: Self-pay

## 2020-05-25 ENCOUNTER — Ambulatory Visit (INDEPENDENT_AMBULATORY_CARE_PROVIDER_SITE_OTHER): Payer: 59 | Admitting: Rehabilitative and Restorative Service Providers"

## 2020-05-25 DIAGNOSIS — M6281 Muscle weakness (generalized): Secondary | ICD-10-CM | POA: Diagnosis not present

## 2020-05-25 DIAGNOSIS — M25561 Pain in right knee: Secondary | ICD-10-CM

## 2020-05-25 DIAGNOSIS — R2689 Other abnormalities of gait and mobility: Secondary | ICD-10-CM | POA: Diagnosis not present

## 2020-05-25 NOTE — Therapy (Signed)
[facility] [place], [place], [address] Phone: [phone]   Fax:  [phone]  Physical Therapy Treatment  Patient Details  Name: Amber Reilly MRN: [id number] Date of Birth: January 31, 1966 Referring Provider (PT): Tracie Harrier, MD   Encounter Date: 05/25/2020   PT End of Session - 05/25/20 1249    Visit Number 25    Number of Visits 40    Date for PT Re-Evaluation 06/28/20    Authorization - Visit Number 93    Authorization - Number of Visits 30    PT Start Time 6962    PT Stop Time 1230    PT Time Calculation (min) 45 min    Activity Tolerance Patient tolerated treatment well    Behavior During Therapy [facility] for tasks assessed/performed           Past Medical History:  Diagnosis Date  . Hypertension     Past Surgical History:  Procedure Laterality Date  . CESAREAN SECTION    . ENDOMETRIAL ABLATION    . FOOT SURGERY Left   . KNEE SURGERY Left   . LAPAROTOMY N/A 11/26/2017   Procedure: EXPLORATORY LAPAROTOMY;  Surgeon: [name], MD;  Location: [facility];  Service: Gynecology;  Laterality: N/A;  . UMBILICAL HERNIA REPAIR N/A 11/26/2017   Procedure: HERNIA REPAIR UMBILICAL ADULT;  Surgeon: [name], MD;  Location: [facility];  Service: Gynecology;  Laterality: N/A;  . UNILATERAL SALPINGECTOMY Right 11/26/2017   Procedure: RIGHT SALPINGO-OOPHORECTOMY;  Surgeon: [name], MD;  Location: [facility];  Service: Gynecology;  Laterality: Right;    There were no vitals filed for this visit.       [facility] PT Assessment - 05/25/20 1146      Assessment   Medical Diagnosis R TKR s/p manipulation under anesthesia 05/16/20    Referring Provider (PT) Tracie Harrier, MD    Onset Date/Surgical Date 01/31/20      AROM   Right Knee Extension -7    Right Knee Flexion 90      PROM   Right Knee Extension -7    Right Knee Flexion 95                         [facility] Adult PT  Treatment/Exercise - 05/25/20 1148      Exercises   Exercises Knee/Hip      Knee/Hip Exercises: Stretches   Active Hamstring Stretch Right;2 reps;30 seconds    Knee: Self-Stretch to increase Flexion Right    Knee: Self-Stretch Limitations wall slides    Other Knee/Hip Stretches quad sretching with one foot on mat trying to sit back on heel      Knee/Hip Exercises: Aerobic   Recumbent Bike full revolutions and patient was able to turn the bike on (generating enough force for multiple revolutions to trigger the bike)    Nustep L5 x 4 minutes      Knee/Hip Exercises: Standing   Hip Flexion Stengthening;Both;20 reps    Hip Flexion Limitations standing marching    Gait Training emphasis on terminal stance keeping heel in contact with floor working on terminal knee extension    Other Standing Knee Exercises backwards walking emphasizing heel down for terminal knee extension    Other Standing Knee Exercises stair negotiation reciprocal pattern ascending and descending      Knee/Hip Exercises: Seated   Long Arc Quad Strengthening;Right;10 reps    Long Arc  Quad Weight 5 lbs.    Long [employer] Limitations *used distraction first to get release      Knee/Hip Exercises: Supine   Psychiatrist Sets Limitations manual cues to lift patella into PT's hands    Heel Slides AAROM;Right;10 reps    Heel Slides Limitations wall slide iwth pillow case    Bridges Strengthening;Both;10 reps    Patellar Mobs supine mobilization med<>lat and superiorly    Knee Extension PROM;AROM    Knee Extension Limitations overpressure into extension      Knee/Hip Exercises: Prone   Other Prone Exercises quadriped using body weight to rock laterally and ant/posterior to gain greater ROM      Manual Therapy   Manual Therapy Passive ROM;Joint mobilization    Manual therapy comments to reduce knee pain and improve motion    Joint Mobilization seat belttibial PA mobilization with patient in  seated position, tibial AP mobs in sitting    Passive ROM end range overpressure                    PT Short Term Goals - 05/17/20 1608      PT SHORT TERM GOAL #1   Title n/a      PT SHORT TERM GOAL #2   Title n/a             PT Long Term Goals - 05/25/20 1250      PT LONG TERM GOAL #1   Title The patient will be indep with progression of HEP.    Time 6    Period Weeks    Status Revised    Target Date 06/28/20      PT LONG TERM GOAL #2   Title The patient will reduce limitation per FOTO to < or equal to 24% limitation.    Time 6    Period Weeks    Status Revised    Target Date 06/28/20      PT LONG TERM GOAL #3   Title The patient will improve A/ROM R knee to 10 to 100 degrees.    Time 6    Period Weeks    Status Revised    Target Date 06/28/20      PT LONG TERM GOAL #4   Title The patient will negotiate 4 steps with one handrail and reciprocal pattern mod indep.    Time 6    Period Weeks    Status Achieved                 Plan - 05/25/20 1251    Clinical Impression Statement The patient improved PROM 7 to 95 degrees (improved from 18 to 70).  She has improved with stair negotiation.  PT to continue to work on normalizing gait and gaining further motion.    Rehab Potential Good    PT Frequency 5x / week    PT Duration 6 weeks    PT Treatment/Interventions ADLs/Self Care Home Management;Vasopneumatic Device;Taping;Manual techniques;Therapeutic activities;Therapeutic exercise;Functional mobility training;Gait training;Stair training;Patient/family education;Electrical Stimulation;Moist Heat;Cryotherapy;Aquatic Therapy    PT Next Visit Plan ROM to patient tolerance, seat belt mobilization with movement.    PT Home Exercise Plan Access Code: [email or web address]    Consulted and Agree with Plan of Care Patient           Patient will benefit from skilled therapeutic intervention in order to improve the following deficits and impairments:  Pain,Decreased  range of motion,Abnormal gait,Decreased strength,Increased edema,Hypomobility,Impaired flexibility,Increased  fascial restricitons  Visit Diagnosis: Acute pain of right knee  Muscle weakness (generalized)  Other abnormalities of gait and mobility     Problem List Patient Active Problem List   Diagnosis Date Noted  . Need for hepatitis C screening test 01/24/2020  . Hyperglycemia 01/24/2020  . Ovarian cyst 11/26/2017  . Obesity (BMI 35.0-39.9 without comorbidity) 07/14/2017  . Multinodular goiter 07/17/2016  . Migraine 06/30/2016  . Plantar fasciitis of left foot 09/16/2013  . LBP (low back pain) 09/05/2013  . Hypertension 04/05/2013  . Vaginitis and vulvovaginitis 03/15/2013  . Hydradenitis 08/20/2011    Learned, PT 05/25/2020, 1:15 PM  [facility] [place] [address] [place], [place], [address] Phone: [phone]   Fax:  [phone]  Name: Amber Reilly MRN: [id number] Date of Birth: [birth year]

## 2020-05-28 ENCOUNTER — Other Ambulatory Visit: Payer: Self-pay

## 2020-05-28 ENCOUNTER — Encounter: Payer: Self-pay | Admitting: Rehabilitative and Restorative Service Providers"

## 2020-05-28 ENCOUNTER — Ambulatory Visit (INDEPENDENT_AMBULATORY_CARE_PROVIDER_SITE_OTHER): Payer: 59 | Admitting: Rehabilitative and Restorative Service Providers"

## 2020-05-28 DIAGNOSIS — M6281 Muscle weakness (generalized): Secondary | ICD-10-CM | POA: Diagnosis not present

## 2020-05-28 DIAGNOSIS — M25561 Pain in right knee: Secondary | ICD-10-CM

## 2020-05-28 DIAGNOSIS — R2689 Other abnormalities of gait and mobility: Secondary | ICD-10-CM | POA: Diagnosis not present

## 2020-05-28 NOTE — Therapy (Signed)
[place], [place], [address] Phone: [phone]   Fax:  [phone]  Physical Therapy Treatment  Patient Details  Name: Amber Reilly MRN: [id number] Date of Birth: 07-14-1965 Referring Provider (PT): Tracie Harrier, MD   Encounter Date: 05/28/2020   PT End of Session - 05/28/20 0900    Visit Number 26    Number of Visits 40    Date for PT Re-Evaluation 06/28/20    Authorization - Visit Number 46    Authorization - Number of Visits 30    PT Start Time 0845    PT Stop Time 0927    PT Time Calculation (min) 42 min    Activity Tolerance Patient tolerated treatment well    Behavior During Therapy [facility] for tasks assessed/performed           Past Medical History:  Diagnosis Date  . Hypertension     Past Surgical History:  Procedure Laterality Date  . CESAREAN SECTION    . ENDOMETRIAL ABLATION    . FOOT SURGERY Left   . KNEE SURGERY Left   . LAPAROTOMY N/A 11/26/2017   Procedure: EXPLORATORY LAPAROTOMY;  Surgeon: [name], MD;  [facility];  Service: Gynecology;  Laterality: N/A;  . UMBILICAL HERNIA REPAIR N/A 11/26/2017   Procedure: HERNIA REPAIR UMBILICAL ADULT;  Surgeon: [name], MD;  Location: [facility];  Service: Gynecology;  Laterality: N/A;  . UNILATERAL SALPINGECTOMY Right 11/26/2017   Procedure: RIGHT SALPINGO-OOPHORECTOMY;  Surgeon: [name], MD;  Location: [facility];  Service: Gynecology;  Laterality: Right;    There were no vitals filed for this visit.   Subjective Assessment - 05/28/20 0852    Subjective The patient reports she got a massage over the weekend.    Pertinent History HTN, h/o migraines    Patient Stated Goals Have mobility back and improve mobility.    Currently in Pain? No/denies              [facility] PT Assessment - 05/28/20 0901      Assessment   Medical Diagnosis R TKR s/p manipulation under anesthesia 05/16/20    Referring Provider  (PT) Tracie Harrier, MD    Onset Date/Surgical Date 01/31/20                         [facility] Adult PT Treatment/Exercise - 05/28/20 0901      Ambulation/Gait   Ambulation/Gait Yes    Stairs Yes    Stairs Assistance 6: Modified independent (Device/Increase time)    Stair Management Technique One rail Right;Alternating pattern;Step to pattern    Pre-Gait Activities Patient needs cues to avoid wider bse and hip hike to avoid R knee flexion    Gait Comments cues      Exercises   Exercises Knee/Hip      Knee/Hip Exercises: Stretches   Passive Hamstring Stretch Right;3 reps;30 seconds    Knee: Self-Stretch to increase Flexion Right    Knee: Self-Stretch Limitations wall slides    Gastroc Stretch Both;1 rep;30 seconds    Gastroc Stretch Limitations on rocker board      Knee/Hip Exercises: Aerobic   Recumbent Bike full revolutions x 2-3 minutes    Nustep L5 x 5 minutes iwth UEs      Knee/Hip Exercises: Standing   Forward Lunges Left;10 reps    Step Down Left    Step Down Limitations to increase  R knee flexion from rocker board, 4" step, then 6" step working on mechanics and dec'ing compensatory strategies    SLS R LE    Gait Training backwards walking emphasizing toe to heel down positioning to get increased terminal knee extension    Other Standing Knee Exercises standing rolling bolster to target encouraging R leg stance with flexion reaching for target      Knee/Hip Exercises: Supine   Heel Slides AAROM;Right;10 reps    Heel Slides Limitations wall slide with 5 lb weight                     PT Long Term Goals - 05/25/20 1250      PT LONG TERM GOAL #1   Title The patient will be indep with progression of HEP.    Time 6    Period Weeks    Status Revised    Target Date 06/28/20      PT LONG TERM GOAL #2   Title The patient will reduce limitation per FOTO to < or equal to 24% limitation.    Time 6    Period Weeks    Status Revised     Target Date 06/28/20      PT LONG TERM GOAL #3   Title The patient will improve A/ROM R knee to 10 to 100 degrees.    Time 6    Period Weeks    Status Revised    Target Date 06/28/20      PT LONG TERM GOAL #4   Title The patient will negotiate 4 steps with one handrail and reciprocal pattern mod indep.    Time 6    Period Weeks    Status Achieved                 Plan - 05/28/20 1216    Clinical Impression Statement The patient has f/u MD appointment later today (note routed on Friday).  She has increased soreness at end range today in medial knee.  PT continuing to progress overall mobility with emphasis on walking mechanics, stairs and ROM today.  Continue working to [employer].    PT Treatment/Interventions ADLs/Self Care Home Management;Vasopneumatic Device;Taping;Manual techniques;Therapeutic activities;Therapeutic exercise;Functional mobility training;Gait training;Stair training;Patient/family education;Electrical Stimulation;Moist Heat;Cryotherapy;Aquatic Therapy    PT Next Visit Plan ROM to tolerance, step downs for knee ROM focusing on dec'ing compensatory movements, end range isometrics and overpressure    PT Home Exercise Plan Access Code: [email or web address]    Consulted and Agree with Plan of Care Patient           Patient will benefit from skilled therapeutic intervention in order to improve the following deficits and impairments:     Visit Diagnosis: Acute pain of right knee  Muscle weakness (generalized)  Other abnormalities of gait and mobility     Problem List Patient Active Problem List   Diagnosis Date Noted  . Need for hepatitis C screening test 01/24/2020  . Hyperglycemia 01/24/2020  . Ovarian cyst 11/26/2017  . Obesity (BMI 35.0-39.9 without comorbidity) 07/14/2017  . Multinodular goiter 07/17/2016  . Migraine 06/30/2016  . Plantar fasciitis of left foot 09/16/2013  . LBP (low back pain) 09/05/2013  . Hypertension 04/05/2013  . Vaginitis and  vulvovaginitis 03/15/2013  . Hydradenitis 08/20/2011    Amber Reilly, PT 05/28/2020, 12:24 PM  [facility] [place] [place], [place], [address] Phone: [phone]   Fax:  [phone]  Name: Amber Reilly MRN: [id number]  Date of Birth: 02/21/1966

## 2020-05-31 ENCOUNTER — Encounter: Payer: Self-pay | Admitting: Physical Therapy

## 2020-05-31 ENCOUNTER — Ambulatory Visit (INDEPENDENT_AMBULATORY_CARE_PROVIDER_SITE_OTHER): Payer: 59 | Admitting: Physical Therapy

## 2020-05-31 ENCOUNTER — Other Ambulatory Visit: Payer: Self-pay

## 2020-05-31 DIAGNOSIS — M25561 Pain in right knee: Secondary | ICD-10-CM

## 2020-05-31 DIAGNOSIS — M6281 Muscle weakness (generalized): Secondary | ICD-10-CM | POA: Diagnosis not present

## 2020-05-31 DIAGNOSIS — R2689 Other abnormalities of gait and mobility: Secondary | ICD-10-CM | POA: Diagnosis not present

## 2020-05-31 NOTE — Therapy (Signed)
[facility] [place], [place], [address] Phone: [phone]   Fax:  [phone]  Physical Therapy Treatment  Patient Details  Name: Amber Reilly MRN: [id number] Date of Birth: [birth year] Referring Provider (PT): Tracie Harrier, MD   Encounter Date: 05/31/2020   PT End of Session - 05/31/20 1543    Visit Number 27    Number of Visits 40    Date for PT Re-Evaluation 06/28/20    Authorization - Visit Number 18    Authorization - Number of Visits 30    PT Start Time 7408   pt arrived late   PT Stop Time 1616    PT Time Calculation (min) 38 min    Activity Tolerance Patient tolerated treatment well    Behavior During Therapy [facility] for tasks assessed/performed           Past Medical History:  Diagnosis Date  . Hypertension     Past Surgical History:  Procedure Laterality Date  . CESAREAN SECTION    . ENDOMETRIAL ABLATION    . FOOT SURGERY Left   . KNEE SURGERY Left   . LAPAROTOMY N/A 11/26/2017   Procedure: EXPLORATORY LAPAROTOMY;  Surgeon: [name], MD;  Location: [facility];  Service: Gynecology;  Laterality: N/A;  . UMBILICAL HERNIA REPAIR N/A 11/26/2017   Procedure: HERNIA REPAIR UMBILICAL ADULT;  Surgeon: [name], MD;  Location: [facility];  Service: Gynecology;  Laterality: N/A;  . UNILATERAL SALPINGECTOMY Right 11/26/2017   Procedure: RIGHT SALPINGO-OOPHORECTOMY;  Surgeon: [name], MD;  Location: [facility];  Service: Gynecology;  Laterality: Right;    There were no vitals filed for this visit.   Subjective Assessment - 05/31/20 1543    Subjective Pt reports she had follow up with surgeon.  He would like her to get to 100 and returns in 5 wks.  He told her it will take her about 3 months to go forward down the stairs.    Patient Stated Goals Have mobility back and improve mobility.    Currently in Pain? No/denies    Pain Score 0-No pain              [facility] PT Assessment -  05/31/20 0001      Assessment   Medical Diagnosis R TKR s/p manipulation under anesthesia 05/16/20    Referring Provider (PT) Tracie Harrier, MD    Onset Date/Surgical Date 01/31/20            [facility] Adult PT Treatment/Exercise - 05/31/20 0001      Knee/Hip Exercises: Stretches   Quad Stretch Right;3 reps;30 seconds   prone   [employer] relax with  therapist over pressure      Knee/Hip Exercises: Aerobic   Elliptical L1: 1 min    Recumbent Bike slow backward revolutions backwards on bicycle x 6 min (at beginning of session) and 2 min  forward (at end of session) for ROM      Knee/Hip Exercises: Machines for Strengthening   Cybex Knee Extension 2 plates - up with 2 LE, down slowly with RLE x 10 reps    Total Gym Leg Press seat at 4:  5 reps - 6 plates, 10 reps 7 plates.      Knee/Hip Exercises: Standing   Lateral Step Up Right;1 set;10 reps;Step Height: 8";Hand Hold: 0    Step Down Right;1 set;10 reps;Step Height: 6";Hand Hold: 2   and retro step  up.     Knee/Hip Exercises: Prone   Prone Knee Hang 1 minute   with STM to calf.   Other Prone Exercises Rt knee TKE x 5 sec x 5 reps (challenging, cues to not compensate in hip/back)                    PT Short Term Goals - 05/17/20 1608      PT SHORT TERM GOAL #1   Title n/a      PT SHORT TERM GOAL #2   Title n/a             PT Long Term Goals - 05/25/20 1250      PT LONG TERM GOAL #1   Title The patient will be indep with progression of HEP.    Time 6    Period Weeks    Status Revised    Target Date 06/28/20      PT LONG TERM GOAL #2   Title The patient will reduce limitation per FOTO to < or equal to 24% limitation.    Time 6    Period Weeks    Status Revised    Target Date 06/28/20      PT LONG TERM GOAL #3   Title The patient will improve A/ROM R knee to 10 to 100 degrees.    Time 6    Period Weeks    Status Revised    Target Date 06/28/20      PT LONG TERM  GOAL #4   Title The patient will negotiate 4 steps with one handrail and reciprocal pattern mod indep.    Time 6    Period Weeks    Status Achieved                 Plan - 05/31/20 1559    Clinical Impression Statement Pt tolerated leg press and knee ext machine well today.  She was able to make full backwards revolutions on bicycle at beginning of session, and forward revolutions at end of session.  Making gradual gains towards remaining goals.    Rehab Potential Good    PT Frequency 5x / week    PT Duration 6 weeks    PT Treatment/Interventions ADLs/Self Care Home Management;Vasopneumatic Device;Taping;Manual techniques;Therapeutic activities;Therapeutic exercise;Functional mobility training;Gait training;Stair training;Patient/family education;Electrical Stimulation;Moist Heat;Cryotherapy;Aquatic Therapy    PT Next Visit Plan ROM to tolerance, step downs for knee ROM focusing on dec'ing compensatory movements, end range isometrics and overpressure    PT Home Exercise Plan Access Code: [email or web address]    Consulted and Agree with Plan of Care Patient           Patient will benefit from skilled therapeutic intervention in order to improve the following deficits and impairments:  Pain,Decreased range of motion,Abnormal gait,Decreased strength,Increased edema,Hypomobility,Impaired flexibility,Increased fascial restricitons  Visit Diagnosis: Acute pain of right knee  Muscle weakness (generalized)  Other abnormalities of gait and mobility     Problem List Patient Active Problem List   Diagnosis Date Noted  . Need for hepatitis C screening test 01/24/2020  . Hyperglycemia 01/24/2020  . Ovarian cyst 11/26/2017  . Obesity (BMI 35.0-39.9 without comorbidity) 07/14/2017  . Multinodular goiter 07/17/2016  . Migraine 06/30/2016  . Plantar fasciitis of left foot 09/16/2013  . LBP (low back pain) 09/05/2013  . Hypertension 04/05/2013  . Vaginitis and vulvovaginitis 03/15/2013  .  Hydradenitis 08/20/2011   Amber Reilly, PTA 05/31/20 4:17 PM  [facility] [address]  [place], [place], [address] Phone: [phone]   Fax:  [phone]  Name: Amber Reilly MRN: [id number] Date of Birth: Nov 17, 1965

## 2020-06-06 ENCOUNTER — Ambulatory Visit (INDEPENDENT_AMBULATORY_CARE_PROVIDER_SITE_OTHER): Payer: 59 | Admitting: Physical Therapy

## 2020-06-06 ENCOUNTER — Other Ambulatory Visit: Payer: Self-pay

## 2020-06-06 DIAGNOSIS — M6281 Muscle weakness (generalized): Secondary | ICD-10-CM

## 2020-06-06 DIAGNOSIS — R2689 Other abnormalities of gait and mobility: Secondary | ICD-10-CM | POA: Diagnosis not present

## 2020-06-06 DIAGNOSIS — M25561 Pain in right knee: Secondary | ICD-10-CM | POA: Diagnosis not present

## 2020-06-06 NOTE — Therapy (Signed)
[place] [facility] [place], [place], [address] Phone: [phone]   Fax:  [phone]  Physical Therapy Treatment  Patient Details  Name: Amber Reilly MRN: [id number] Date of Birth: January 15, 1966 Referring Provider (PT): Tracie Harrier, MD   Encounter Date: 06/06/2020   PT End of Session - 06/06/20 1611    Visit Number 28    Number of Visits 40    Date for PT Re-Evaluation 06/28/20    Authorization - Visit Number [id number]    Authorization - Number of Visits 30    PT Start Time 1602    PT Stop Time 1640    PT Time Calculation (min) 38 min    Activity Tolerance Patient tolerated treatment well    Behavior During Therapy [facility] for tasks assessed/performed           Past Medical History:  Diagnosis Date  . Hypertension     Past Surgical History:  Procedure Laterality Date  . CESAREAN SECTION    . ENDOMETRIAL ABLATION    . FOOT SURGERY Left   . KNEE SURGERY Left   . LAPAROTOMY N/A 11/26/2017   Procedure: EXPLORATORY LAPAROTOMY;  Surgeon: [name], MD;  Location: [facility];  Service: Gynecology;  Laterality: N/A;  . UMBILICAL HERNIA REPAIR N/A 11/26/2017   Procedure: HERNIA REPAIR UMBILICAL ADULT;  Surgeon: [name], MD;  Location: [facility];  Service: Gynecology;  Laterality: N/A;  . UNILATERAL SALPINGECTOMY Right 11/26/2017   Procedure: RIGHT SALPINGO-OOPHORECTOMY;  Surgeon: [name], MD;  Location: [facility];  Service: Gynecology;  Laterality: Right;    There were no vitals filed for this visit.   Subjective Assessment - 06/06/20 1624    Subjective Pt reports she has been working hard on the bend of her Rt knee. "I bet it's at 100". She states she is now going down the stairs straight ahead (not sideways).    Currently in Pain? No/denies    Pain Score 0-No pain              [facility] PT Assessment - 06/06/20 0001      Assessment   Medical Diagnosis R TKR s/p manipulation under anesthesia 05/16/20     Referring Provider (PT) Tracie Harrier, MD    Onset Date/Surgical Date 01/31/20    Next MD Visit 06/25/20      AROM   Right/Left Knee Right    Right Knee Extension -10      PROM   Right Knee Flexion 102           [facility] Adult PT Treatment/Exercise - 06/06/20 0001      Knee/Hip Exercises: Stretches   Passive Hamstring Stretch Right;60 seconds   long sitting, overpressure from pt   Gastroc Stretch Both;2 reps;20 seconds      Knee/Hip Exercises: Aerobic   Recumbent Bike L1: 3 min (after PROM)    Nustep L5: 4.5 min for ROM (seat 5)    Other Aerobic single laps around gym to assess response to exercise and decrease stiffness.      Knee/Hip Exercises: Standing   Side Lunges Both;1 set;10 reps   hands on counter   Step Down Right;1 set;10 reps;Step Height: 6";Hand Hold: 2   and retro step up.   Other Standing Knee Exercises mini-split squats x 8 each, with UE support on counter (multiple cues for form.      Knee/Hip Exercises: Supine   Heel Slides AAROM;Right  1 long rep, with strap assist     Manual Therapy   Manual Therapy Passive ROM    Soft tissue mobilization STM to Rt lateral distal quad after PROM    Passive ROM end range overpressure into Rt knee ext and flexion in sitting and supine.                    PT Short Term Goals - 05/17/20 1608      PT SHORT TERM GOAL #1   Title n/a      PT SHORT TERM GOAL #2   Title n/a             PT Long Term Goals - 05/25/20 1250      PT LONG TERM GOAL #1   Title The patient will be indep with progression of HEP.    Time 6    Period Weeks    Status Revised    Target Date 06/28/20      PT LONG TERM GOAL #2   Title The patient will reduce limitation per FOTO to < or equal to 24% limitation.    Time 6    Period Weeks    Status Revised    Target Date 06/28/20      PT LONG TERM GOAL #3   Title The patient will improve A/ROM R knee to 10 to 100 degrees.    Time 6    Period Weeks    Status Revised     Target Date 06/28/20      PT LONG TERM GOAL #4   Title The patient will negotiate 4 steps with one handrail and reciprocal pattern mod indep.    Time 6    Period Weeks    Status Achieved                 Plan - 06/06/20 1653    Clinical Impression Statement Pt able to reach 102 of Rt knee PROM today; lacking 10 deg of ext.  Pt less guarded with PROM today and able to complete full revolutions on L1 on recumbent bike today with minimal hip compensation.  Progressing well towards remaining goals.    Rehab Potential Good    PT Frequency 5x / week    PT Duration 6 weeks    PT Treatment/Interventions ADLs/Self Care Home Management;Vasopneumatic Device;Taping;Manual techniques;Therapeutic activities;Therapeutic exercise;Functional mobility training;Gait training;Stair training;Patient/family education;Electrical Stimulation;Moist Heat;Cryotherapy;Aquatic Therapy    PT Next Visit Plan ROM and functional strengthening    PT Home Exercise Plan Access Code: [email or web address]    Consulted and Agree with Plan of Care Patient           Patient will benefit from skilled therapeutic intervention in order to improve the following deficits and impairments:  Pain,Decreased range of motion,Abnormal gait,Decreased strength,Increased edema,Hypomobility,Impaired flexibility,Increased fascial restricitons  Visit Diagnosis: Acute pain of right knee  Muscle weakness (generalized)  Other abnormalities of gait and mobility     Problem List Patient Active Problem List   Diagnosis Date Noted  . Need for hepatitis C screening test 01/24/2020  . Hyperglycemia 01/24/2020  . Ovarian cyst 11/26/2017  . Obesity (BMI 35.0-39.9 without comorbidity) 07/14/2017  . Multinodular goiter 07/17/2016  . Migraine 06/30/2016  . Plantar fasciitis of left foot 09/16/2013  . LBP (low back pain) 09/05/2013  . Hypertension 04/05/2013  . Vaginitis and vulvovaginitis 03/15/2013  . Hydradenitis 08/20/2011   Kerin Perna, PTA 06/06/20 4:55 PM   [facility] [address]  [place], [place], [address] Phone: [phone]   Fax:  [phone]  Name: Amber Reilly MRN: [id number] Date of Birth: [birth year]

## 2020-06-08 ENCOUNTER — Other Ambulatory Visit: Payer: Self-pay

## 2020-06-08 ENCOUNTER — Encounter: Payer: Self-pay | Admitting: Rehabilitative and Restorative Service Providers"

## 2020-06-08 ENCOUNTER — Ambulatory Visit (INDEPENDENT_AMBULATORY_CARE_PROVIDER_SITE_OTHER): Payer: 59 | Admitting: Rehabilitative and Restorative Service Providers"

## 2020-06-08 DIAGNOSIS — M6281 Muscle weakness (generalized): Secondary | ICD-10-CM

## 2020-06-08 DIAGNOSIS — R2689 Other abnormalities of gait and mobility: Secondary | ICD-10-CM

## 2020-06-08 DIAGNOSIS — M25561 Pain in right knee: Secondary | ICD-10-CM | POA: Diagnosis not present

## 2020-06-08 NOTE — Patient Instructions (Signed)
Access Code: [email or web address] URL: [email or web address] Date: 06/08/2020 Prepared by: Rudell Cobb  Exercises Prone Knee Extension with Ankle Weight - 2 x daily - 7 x weekly - 1 sets - 1 reps - 1-2 minutes hold Straight Leg Raise with Arm Support - 2 x daily - 7 x weekly - 1 sets - 10 reps Sidestepping - 2 x daily - 7 x weekly - 1 sets - 10 reps Standing March - 2 x daily - 7 x weekly - 1 sets - 10 reps Backwards Walking - 2 x daily - 7 x weekly - 1 sets - 10 reps Single Leg Stance - 2 x daily - 7 x weekly - 1 sets - 10 reps Seated Hamstring Stretch with Chair - 2 x daily - 7 x weekly - 1 sets - 10 reps Gastroc Stretch on Wall - 2 x daily - 7 x weekly - 1 sets - 3 reps - 30 seconds hold Standing Shoulder and Trunk Flexion at Table - 2 x daily - 7 x weekly - 1 sets - 10 reps Long Sitting 4 Way Patellar Glide - 2 x daily - 7 x weekly - 1 sets - 10 reps

## 2020-06-08 NOTE — Therapy (Signed)
[place], [place], [address] Phone: [phone]   Fax:  [phone]  Physical Therapy Treatment  Patient Details  Name: Amber Reilly MRN: [id number] Date of Birth: [birth year] Referring Provider (PT): Tracie Harrier, MD   Encounter Date: 06/08/2020   PT End of Session - 06/08/20 1533    Visit Number 29    Number of Visits 40    Date for PT Re-Evaluation 06/28/20    Authorization - Visit Number 44    Authorization - Number of Visits 30    PT Start Time 1525    PT Stop Time 1610    PT Time Calculation (min) 45 min    Activity Tolerance Patient tolerated treatment well    Behavior During Therapy [facility] for tasks assessed/performed           Past Medical History:  Diagnosis Date  . Hypertension     Past Surgical History:  Procedure Laterality Date  . CESAREAN SECTION    . ENDOMETRIAL ABLATION    . FOOT SURGERY Left   . KNEE SURGERY Left   . LAPAROTOMY N/A 11/26/2017   Procedure: EXPLORATORY LAPAROTOMY;  Surgeon: [name], MD;  [facility]: [facility];  Service: Gynecology;  Laterality: N/A;  . UMBILICAL HERNIA REPAIR N/A 11/26/2017   Procedure: HERNIA REPAIR UMBILICAL ADULT;  Surgeon: [name], MD;  [facility]: [facility];  Service: Gynecology;  Laterality: N/A;  . UNILATERAL SALPINGECTOMY Right 11/26/2017   Procedure: RIGHT SALPINGO-OOPHORECTOMY;  Surgeon: [name], MD;  [facility];  Service: Gynecology;  Laterality: Right;    There were no vitals filed for this visit.   Subjective Assessment - 06/08/20 1525    Subjective The patient reports she is up to 102 degrees flexion!  She is moving better.  She  is doing descending steps reciprocal pattern.    Patient Stated Goals Have mobility back and improve mobility.    Currently in Pain? No/denies              [facility] PT Assessment - 06/08/20 1527      Assessment   Medical Diagnosis R TKR s/p manipulation under anesthesia  05/16/20    Referring Provider (PT) Tracie Harrier, MD    Onset Date/Surgical Date 01/31/20      AROM   Overall AROM Comments extensor lag of 10 degrees    Right/Left Knee Right    Right Knee Extension -7    Right Knee Flexion 100      PROM   Right Knee Flexion 102                         [facility] Adult PT Treatment/Exercise - 06/08/20 1527      Self-Care   Self-Care Other Self-Care Comments    Other Self-Care Comments  foam rollling for calf lengthening      Exercises   Exercises Knee/Hip      Knee/Hip Exercises: Aerobic   Recumbent Bike L1 x 3 minutes    Other Aerobic single laps around gym to assess response to exercise and decrease stiffness.      Knee/Hip Exercises: Standing   Heel Raises Both    Heel Raises Limitations toe walking and heel walking x 20 feet x 4 reps    Knee Flexion AROM;Strengthening;Right;5 reps    Knee Flexion Limitations while working on L single leg stance emphasized R knee flexion  Hip Flexion Stengthening;Right;Left;10 reps    Hip Flexion Limitations marching in place    Forward Lunges Right;Left;5 reps    Forward Lunges Limitations split lunges x 5 reps    Lateral Step Up Right;1 set;10 reps    Lateral Step Up Limitations 4" step focusing on control with step down    Forward Step Up Right;10 reps    Forward Step Up Limitations 6" step with dec'ing UE support    Functional Squat 10 reps    Functional Squat Limitations with cues to keep equal weight shift    SLS 10 seconds R and L LE    Other Standing Knee Exercises mini split squats in staggered stance    Other Standing Knee Exercises knee flexion with R LE on 12" step and anterior rocking      Knee/Hip Exercises: Supine   Straight Leg Raises Strengthening;Right;10 reps    Straight Leg Raises Limitations long sitting      Knee/Hip Exercises: Prone   Hamstring Curl 5 reps    Other Prone Exercises quadriped sitting back into heels      Manual Therapy   Manual  Therapy Soft tissue mobilization    Soft tissue mobilization R gastroc and hamstring STM and tissue rolling with foam roller                  PT Education - 06/08/20 1613    Education Details HEP    Person(s) Educated Patient    Methods Explanation;Demonstration;Handout    Comprehension Returned demonstration;Verbalized understanding            PT Short Term Goals - 05/17/20 1608      PT SHORT TERM GOAL #1   Title n/a      PT SHORT TERM GOAL #2   Title n/a             PT Long Term Goals - 05/25/20 1250      PT LONG TERM GOAL #1   Title The patient will be indep with progression of HEP.    Time 6    Period Weeks    Status Revised    Target Date 06/28/20      PT LONG TERM GOAL #2   Title The patient will reduce limitation per FOTO to < or equal to 24% limitation.    Time 6    Period Weeks    Status Revised    Target Date 06/28/20      PT LONG TERM GOAL #3   Title The patient will improve A/ROM R knee to 10 to 100 degrees.    Time 6    Period Weeks    Status Revised    Target Date 06/28/20      PT LONG TERM GOAL #4   Title The patient will negotiate 4 steps with one handrail and reciprocal pattern mod indep.    Time 6    Period Weeks    Status Achieved                 Plan - 06/08/20 1643    Clinical Impression Statement The patient is continuing to gain more control throughout her ROM.  She had improvement in knee extension today and PT worked on progressing her HEP.  She is progressing well in therapy.    PT Treatment/Interventions ADLs/Self Care Home Management;Vasopneumatic Device;Taping;Manual techniques;Therapeutic activities;Therapeutic exercise;Functional mobility training;Gait training;Stair training;Patient/family education;Electrical Stimulation;Moist Heat;Cryotherapy;Aquatic Therapy    PT Next Visit Plan ROM and functional strengthening  PT Home Exercise Plan Access Code: [email or web address]    Consulted and Agree with Plan of Care  Patient           Patient will benefit from skilled therapeutic intervention in order to improve the following deficits and impairments:  Pain,Decreased range of motion,Abnormal gait,Decreased strength,Increased edema,Hypomobility,Impaired flexibility,Increased fascial restricitons  Visit Diagnosis: Acute pain of right knee  Muscle weakness (generalized)  Other abnormalities of gait and mobility     Problem List Patient Active Problem List   Diagnosis Date Noted  . Need for hepatitis C screening test 01/24/2020  . Hyperglycemia 01/24/2020  . Ovarian cyst 11/26/2017  . Obesity (BMI 35.0-39.9 without comorbidity) 07/14/2017  . Multinodular goiter 07/17/2016  . Migraine 06/30/2016  . Plantar fasciitis of left foot 09/16/2013  . LBP (low back pain) 09/05/2013  . Hypertension 04/05/2013  . Vaginitis and vulvovaginitis 03/15/2013  . Hydradenitis 08/20/2011    [place], [place] 06/08/2020, 4:43 PM  [facility] [place], [place], [address] Phone: [phone]   Fax:  [phone]  Name: Amber Reilly MRN: [id number] Date of Birth: 05-Jun-1965

## 2020-06-11 ENCOUNTER — Other Ambulatory Visit: Payer: Self-pay

## 2020-06-11 ENCOUNTER — Ambulatory Visit (INDEPENDENT_AMBULATORY_CARE_PROVIDER_SITE_OTHER): Payer: 59 | Admitting: Rehabilitative and Restorative Service Providers"

## 2020-06-11 ENCOUNTER — Encounter: Payer: Self-pay | Admitting: Rehabilitative and Restorative Service Providers"

## 2020-06-11 DIAGNOSIS — R2689 Other abnormalities of gait and mobility: Secondary | ICD-10-CM | POA: Diagnosis not present

## 2020-06-11 DIAGNOSIS — M6281 Muscle weakness (generalized): Secondary | ICD-10-CM

## 2020-06-11 DIAGNOSIS — M25561 Pain in right knee: Secondary | ICD-10-CM

## 2020-06-11 NOTE — Therapy (Signed)
[place], [place], [address] Phone: [phone]   Fax:  [phone]  Physical Therapy Treatment  Patient Details  Name: Amber Reilly MRN: [id number] Date of Birth: Aug 28, 1965 Referring Provider (PT): Tracie Harrier, MD   Encounter Date: 06/11/2020   PT End of Session - 06/11/20 1523    Visit Number 30    Number of Visits 40    Date for PT Re-Evaluation 06/28/20    Authorization - Visit Number 21    Authorization - Number of Visits 30    PT Start Time 1510    PT Stop Time 1600    PT Time Calculation (min) 50 min    Activity Tolerance Patient tolerated treatment well    Behavior During Therapy [facility] for tasks assessed/performed           Past Medical History:  Diagnosis Date  . Hypertension     Past Surgical History:  Procedure Laterality Date  . CESAREAN SECTION    . ENDOMETRIAL ABLATION    . FOOT SURGERY Left   . KNEE SURGERY Left   . LAPAROTOMY N/A 11/26/2017   Procedure: EXPLORATORY LAPAROTOMY;  Surgeon: [name], MD;  [facility]: [facility];  Service: Gynecology;  Laterality: N/A;  . UMBILICAL HERNIA REPAIR N/A 11/26/2017   Procedure: HERNIA REPAIR UMBILICAL ADULT;  Surgeon: [name], MD;  [facility]: [facility];  Service: Gynecology;  Laterality: N/A;  . UNILATERAL SALPINGECTOMY Right 11/26/2017   Procedure: RIGHT SALPINGO-OOPHORECTOMY;  Surgeon: [name], MD;  Location: [facility];  Service: Gynecology;  Laterality: Right;    There were no vitals filed for this visit.   Subjective Assessment - 06/11/20 1522    Subjective The patient reports she is doing exercise at home.  Her next MD visit is 06/25/20.    Pertinent History HTN, h/o migraines    Patient Stated Goals Have mobility back and improve mobility.    Currently in Pain? No/denies              [facility] PT Assessment - 06/11/20 1525      Assessment   Medical Diagnosis R TKR s/p manipulation under anesthesia 05/16/20     Referring Provider (PT) Tracie Harrier, MD    Onset Date/Surgical Date 01/31/20      AROM   Right/Left Knee Right    Right Knee Extension 8    Right Knee Flexion 102      PROM   Right Knee Flexion 105                         [facility] Adult PT Treatment/Exercise - 06/11/20 1525      Exercises   Exercises Knee/Hip      Knee/Hip Exercises: Stretches   Gastroc Stretch Both;2 reps;20 seconds    Other Knee/Hip Stretches knee flexion overpressure in sitting on elevated mat table      Knee/Hip Exercises: Aerobic   Recumbent Bike L1 x 3 minutes    Nustep 4 minutes L 5 LEs only    Other Aerobic single laps around gym to assess response to exercise and decrease stiffness.      Knee/Hip Exercises: Plyometrics   Other Plyometric Exercises trampoline mini jumps      Knee/Hip Exercises: Standing   Heel Raises Limitations toe walking and heel walking x 20 feet x 4 reps    Knee Flexion Limitations high marches    Forward  Lunges Right;Left;10 reps    Forward Lunges Limitations lunge marches x 20 feet x 3 reps    Forward Step Up Right;Left;10 reps   10" step height   Functional Squat 5 reps    SLS on solid and compliant surfaces    Other Standing Knee Exercises sit to stand and 1/2 sit holds x 10 reps    Other Standing Knee Exercises bosu board with mini squats; trampline standing with marching and SLS      Knee/Hip Exercises: Seated   Other Seated Knee/Hip Exercises overpressure into flexion/extension; seat belt mobilization with movement      Knee/Hip Exercises: Prone   Other Prone Exercises quadriped working on knee flexion to tolerance      Manual Therapy   Manual Therapy Soft tissue mobilization;Joint mobilization    Joint Mobilization mobilization with movement seated with belt around proximal calf    Soft tissue mobilization lateral thigh and medial HS                    PT Short Term Goals - 05/17/20 1608      PT SHORT TERM GOAL #1    Title n/a      PT SHORT TERM GOAL #2   Title n/a             PT Long Term Goals - 06/11/20 1625      PT LONG TERM GOAL #1   Title The patient will be indep with progression of HEP.    Time 6    Period Weeks    Status On-going      PT LONG TERM GOAL #2   Title The patient will reduce limitation per FOTO to < or equal to 24% limitation.    Time 6    Period Weeks    Status On-going      PT LONG TERM GOAL #3   Title The patient will improve A/ROM R knee to 10 to 100 degrees.    Baseline 10 to 102 degrees A/ROM    Time 6    Period Weeks    Status Achieved      PT LONG TERM GOAL #4   Title The patient will negotiate 4 steps with one handrail and reciprocal pattern mod indep.    Time 6    Period Weeks    Status Achieved                 Plan - 06/11/20 1632    Clinical Impression Statement The patient ha smet 2 LTGs.  She is continuing to make gains with ROM and functional mobility.  PT to continue working to [place] anticipating d/c in the next 1-2 weeks.    PT Treatment/Interventions ADLs/Self Care Home Management;Vasopneumatic Device;Taping;Manual techniques;Therapeutic activities;Therapeutic exercise;Functional mobility training;Gait training;Stair training;Patient/family education;Electrical Stimulation;Moist Heat;Cryotherapy;Aquatic Therapy    PT Next Visit Plan ROM and functional strengthening    PT Home Exercise Plan Access Code: [email or web address]    Consulted and Agree with Plan of Care Patient           Patient will benefit from skilled therapeutic intervention in order to improve the following deficits and impairments:     Visit Diagnosis: Muscle weakness (generalized)  Acute pain of right knee  Other abnormalities of gait and mobility     Problem List Patient Active Problem List   Diagnosis Date Noted  . Need for hepatitis C screening test 01/24/2020  . Hyperglycemia 01/24/2020  . Ovarian cyst 11/26/2017  .  Obesity (BMI 35.0-39.9 without comorbidity)  07/14/2017  . Multinodular goiter 07/17/2016  . Migraine 06/30/2016  . Plantar fasciitis of left foot 09/16/2013  . LBP (low back pain) 09/05/2013  . Hypertension 04/05/2013  . Vaginitis and vulvovaginitis 03/15/2013  . Hydradenitis 08/20/2011    [place], [place] 06/11/2020, 4:33 PM  [facility] [place], [place], [address] Phone: [phone]   Fax:  [phone]  Name: Amber Reilly MRN: [id number] Date of Birth: 09/18/1965

## 2020-06-14 ENCOUNTER — Other Ambulatory Visit: Payer: Self-pay

## 2020-06-14 ENCOUNTER — Encounter: Payer: Self-pay | Admitting: Rehabilitative and Restorative Service Providers"

## 2020-06-14 ENCOUNTER — Ambulatory Visit (INDEPENDENT_AMBULATORY_CARE_PROVIDER_SITE_OTHER): Payer: 59 | Admitting: Rehabilitative and Restorative Service Providers"

## 2020-06-14 DIAGNOSIS — R2689 Other abnormalities of gait and mobility: Secondary | ICD-10-CM | POA: Diagnosis not present

## 2020-06-14 DIAGNOSIS — M25561 Pain in right knee: Secondary | ICD-10-CM

## 2020-06-14 DIAGNOSIS — M6281 Muscle weakness (generalized): Secondary | ICD-10-CM | POA: Diagnosis not present

## 2020-06-14 NOTE — Therapy (Signed)
[place], [place], [address] Phone: [phone]   Fax:  [phone]  Physical Therapy Treatment  Patient Details  Name: Amber Reilly MRN: [id number] Date of Birth: 1965/05/13 Referring Provider (PT): Tracie Harrier, MD   Encounter Date: 06/14/2020   PT End of Session - 06/14/20 1535    Visit Number 31    Number of Visits 40    Date for PT Re-Evaluation 06/28/20    Authorization Type UHC    Authorization - Visit Number 90    Authorization - Number of Visits 30    PT Start Time 5916    PT Stop Time 1614    PT Time Calculation (min) 43 min    Activity Tolerance Patient tolerated treatment well    Behavior During Therapy [facility] for tasks assessed/performed           Past Medical History:  Diagnosis Date  . Hypertension     Past Surgical History:  Procedure Laterality Date  . CESAREAN SECTION    . ENDOMETRIAL ABLATION    . FOOT SURGERY Left   . KNEE SURGERY Left   . LAPAROTOMY N/A 11/26/2017   Procedure: EXPLORATORY LAPAROTOMY;  Surgeon: [name], MD;  Location: [facility];  Service: Gynecology;  Laterality: N/A;  . UMBILICAL HERNIA REPAIR N/A 11/26/2017   Procedure: HERNIA REPAIR UMBILICAL ADULT;  Surgeon: [name], MD;  Location: [facility];  Service: Gynecology;  Laterality: N/A;  . UNILATERAL SALPINGECTOMY Right 11/26/2017   Procedure: RIGHT SALPINGO-OOPHORECTOMY;  Surgeon: [name], MD;  Location: [facility];  Service: Gynecology;  Laterality: Right;    There were no vitals filed for this visit.   Subjective Assessment - 06/14/20 1533    Subjective The patient reports that she is doing well today.    Pertinent History HTN, h/o migraines    Patient Stated Goals Have mobility back and improve mobility.    Currently in Pain? No/denies              [facility] PT Assessment - 06/14/20 1540      Assessment   Medical Diagnosis R TKR s/p manipulation under anesthesia 05/16/20     Referring Provider (PT) Tracie Harrier, MD    Onset Date/Surgical Date 01/31/20    Hand Dominance Right      AROM   Right Knee Flexion 102      PROM   Right Knee Flexion 107                         [facility] Adult PT Treatment/Exercise - 06/14/20 1541      Ambulation/Gait   Ambulation/Gait Yes    Ambulation/Gait Assistance 7: Independent    Stairs Yes    Stairs Assistance 6: Modified independent (Device/Increase time)    Stair Management Technique One rail Right;Alternating pattern    Pre-Gait Activities no rails ascending steps      Exercises   Exercises Knee/Hip      Knee/Hip Exercises: Stretches   Pension scheme manager reps;30 seconds    Other Knee/Hip Stretches knee flexion overpressure in sitting on elevated mat table      Knee/Hip Exercises: Aerobic   Tread Mill x 4 minutes at 1.6 mph at 4% incline    Recumbent Bike x 3 minutes at end of session      Knee/Hip Exercises: Standing   Forward Step Up Right;Left;10 reps;Step Height: 6"  SLS single limb stance on solid surface and compliant surfaces    SLS with Vectors anterior reach in R leg stance and sliding yoga block along the ground    Other Standing Knee Exercises forward lunge with isometric hold (high lunge)      Knee/Hip Exercises: Supine   Knee Flexion Limitations with passive overpressure      Knee/Hip Exercises: Prone   Hamstring Curl 5 reps    Hamstring Curl Limitations isometric holds x 5 seconds    Straight Leg Raises Strengthening;Right;10 reps      Manual Therapy   Manual Therapy Soft tissue mobilization    Manual therapy comments to reduce muscle tightness    Soft tissue mobilization STM med/lateral HS musculature and laterla quad, gastrocs                  PT Education - 06/14/20 1535    Education Details HEP    Person(s) Educated Patient    Methods Explanation;Demonstration;Handout    Comprehension Verbalized understanding;Returned demonstration             PT Short Term Goals - 05/17/20 1608      PT SHORT TERM GOAL #1   Title n/a      PT SHORT TERM GOAL #2   Title n/a             PT Long Term Goals - 06/11/20 1625      PT LONG TERM GOAL #1   Title The patient will be indep with progression of HEP.    Time 6    Period Weeks    Status On-going      PT LONG TERM GOAL #2   Title The patient will reduce limitation per FOTO to < or equal to 24% limitation.    Time 6    Period Weeks    Status On-going      PT LONG TERM GOAL #3   Title The patient will improve A/ROM R knee to 10 to 100 degrees.    Baseline 10 to 102 degrees A/ROM    Time 6    Period Weeks    Status Achieved      PT LONG TERM GOAL #4   Title The patient will negotiate 4 steps with one handrail and reciprocal pattern mod indep.    Time 6    Period Weeks    Status Achieved                 Plan - 06/14/20 1852    Clinical Impression Statement The patient is continuing to make gains in PT.  PT plans to d/c next week continuing to progress strengthening, ROM and mobility.    PT Treatment/Interventions ADLs/Self Care Home Management;Vasopneumatic Device;Taping;Manual techniques;Therapeutic activities;Therapeutic exercise;Functional mobility training;Gait training;Stair training;Patient/family education;Electrical Stimulation;Moist Heat;Cryotherapy;Aquatic Therapy    PT Next Visit Plan ROM and functional strengthening    PT Home Exercise Plan Access Code: [email or web address]    Consulted and Agree with Plan of Care Patient           Patient will benefit from skilled therapeutic intervention in order to improve the following deficits and impairments:     Visit Diagnosis: Muscle weakness (generalized)  Acute pain of right knee  Other abnormalities of gait and mobility     Problem List Patient Active Problem List   Diagnosis Date Noted  . Need for hepatitis C screening test 01/24/2020  . Hyperglycemia 01/24/2020  . Ovarian cyst 11/26/2017  .  Obesity (BMI  35.0-39.9 without comorbidity) 07/14/2017  . Multinodular goiter 07/17/2016  . Migraine 06/30/2016  . Plantar fasciitis of left foot 09/16/2013  . LBP (low back pain) 09/05/2013  . Hypertension 04/05/2013  . Vaginitis and vulvovaginitis 03/15/2013  . Hydradenitis 08/20/2011    [place], [place] 06/14/2020, 6:55 PM  [facility] [place], [place], [address] Phone: [phone]   Fax:  [phone]  Name: Amber Reilly MRN: [id number] Date of Birth: December 06, 1965

## 2020-06-18 ENCOUNTER — Ambulatory Visit (INDEPENDENT_AMBULATORY_CARE_PROVIDER_SITE_OTHER): Payer: 59 | Admitting: Rehabilitative and Restorative Service Providers"

## 2020-06-18 ENCOUNTER — Other Ambulatory Visit: Payer: Self-pay

## 2020-06-18 DIAGNOSIS — M6281 Muscle weakness (generalized): Secondary | ICD-10-CM

## 2020-06-18 DIAGNOSIS — R2689 Other abnormalities of gait and mobility: Secondary | ICD-10-CM | POA: Diagnosis not present

## 2020-06-18 DIAGNOSIS — M25561 Pain in right knee: Secondary | ICD-10-CM

## 2020-06-18 NOTE — Therapy (Signed)
[place], [place], [address] Phone: [phone]   Fax:  [phone]  Physical Therapy Treatment and Discharge Summary  Patient Details  Name: Amber Reilly MRN: [id number] Date of Birth: Apr 04, 1965 Referring Provider (PT): Tracie Harrier, MD   PHYSICAL THERAPY DISCHARGE SUMMARY  Visits from Start of Care: 32  Current functional level related to goals / functional outcomes: See goals below   Remaining deficits: dec'd R knee extension AROM  8 to 104 degrees AROM   Education / Equipment: HEP, continued the ex post d/c  Plan: Patient agrees to discharge.  Patient goals were met. Patient is being discharged due to meeting the stated rehab goals.  ?????          Encounter Date: 06/18/2020   PT End of Session - 06/18/20 1558    Visit Number 32    Number of Visits 40    Date for PT Re-Evaluation 06/28/20    Authorization Type UHC    Authorization - Visit Number 23    Authorization - Number of Visits 30    PT Start Time 1510    PT Stop Time 1534    PT Time Calculation (min) 24 min    Activity Tolerance Patient tolerated treatment well    Behavior During Therapy WFL for tasks assessed/performed           Past Medical History:  Diagnosis Date  . Hypertension     Past Surgical History:  Procedure Laterality Date  . CESAREAN SECTION    . ENDOMETRIAL ABLATION    . FOOT SURGERY Left   . KNEE SURGERY Left   . LAPAROTOMY N/A 11/26/2017   Procedure: EXPLORATORY LAPAROTOMY;  Surgeon: [name], MD;  [facility]: [facility];  Service: Gynecology;  Laterality: N/A;  . UMBILICAL HERNIA REPAIR N/A 11/26/2017   Procedure: HERNIA REPAIR UMBILICAL ADULT;  Surgeon: [name], MD;  Location: [facility];  Service: Gynecology;  Laterality: N/A;  . UNILATERAL SALPINGECTOMY Right 11/26/2017   Procedure: RIGHT SALPINGO-OOPHORECTOMY;  Surgeon: [name], MD;  Location: [facility];  Service:  Gynecology;  Laterality: Right;    There were no vitals filed for this visit.   Subjective Assessment - 06/18/20 1521    Subjective The only thing I can't do is sit all the way back on my leg.    Pertinent History HTN, h/o migraines    Patient Stated Goals Have mobility back and improve mobility.    Currently in Pain? No/denies              [facility] PT Assessment - 06/18/20 1603      Assessment   Medical Diagnosis R TKR s/p manipulation under anesthesia 05/16/20    Referring Provider (PT) Tracie Harrier, MD    Onset Date/Surgical Date 01/31/20      Observation/Other Assessments   Focus on Therapeutic Outcomes (FOTO)  98% (2% limitation)      AROM   Right Knee Extension 8    Right Knee Flexion 104      PROM   Right Knee Extension -7    Right Knee Flexion 107                         [facility] Adult PT Treatment/Exercise - 06/18/20 1604      Ambulation/Gait   Ambulation/Gait Yes    Ambulation/Gait Assistance 7: Independent      Exercises  Exercises Knee/Hip      Knee/Hip Exercises: Stretches   Other Knee/Hip Stretches quadriped pressing into knee flexion for overpressure      Knee/Hip Exercises: Aerobic   Tread Mill x 5 minutes at 1.8 mph and 3% incline    Recumbent Bike 5 minutes with full revolutions      Knee/Hip Exercises: Standing   Functional Squat 5 reps    Functional Squat Limitations reaching for ground working on weight shift through R leg    Gait Training light jogging to be able to keep up with grandkids    Other Standing Knee Exercises hopping in place      Knee/Hip Exercises: Seated   Hamstring Curl Right;AROM      Manual Therapy   Manual Therapy Passive ROM    Passive ROM overpressure in sitting into flexion PROM                    PT Short Term Goals - 05/17/20 1608      PT SHORT TERM GOAL #1   Title n/a      PT SHORT TERM GOAL #2   Title n/a             PT Long Term Goals - 06/18/20 1600      PT  LONG TERM GOAL #1   Title The patient will be indep with progression of HEP.    Time 6    Period Weeks    Status Achieved      PT LONG TERM GOAL #2   Title The patient will reduce limitation per FOTO to < or equal to 24% limitation.    Baseline 2% limitation (was 41% last assessed)    Time 6    Period Weeks    Status Achieved      PT LONG TERM GOAL #3   Title The patient will improve A/ROM R knee to 10 to 100 degrees.    Baseline 10 to 102 degrees A/ROM    Time 6    Period Weeks    Status Achieved      PT LONG TERM GOAL #4   Title The patient will negotiate 4 steps with one handrail and reciprocal pattern mod indep.    Time 6    Period Weeks    Status Achieved                 Plan - 06/18/20 1559    Clinical Impression Statement The patient has met all LTGs.  She reports feeling like she has the exercise program to continue post d/c and is walking and exercising at home.    PT Treatment/Interventions ADLs/Self Care Home Management;Vasopneumatic Device;Taping;Manual techniques;Therapeutic activities;Therapeutic exercise;Functional mobility training;Gait training;Stair training;Patient/family education;Electrical Stimulation;Moist Heat;Cryotherapy;Aquatic Therapy    PT Next Visit Plan discharge    PT Home Exercise Plan Access Code: [email or web address]    Consulted and Agree with Plan of Care Patient           Patient will benefit from skilled therapeutic intervention in order to improve the following deficits and impairments:     Visit Diagnosis: Muscle weakness (generalized)  Acute pain of right knee  Other abnormalities of gait and mobility     Problem List Patient Active Problem List   Diagnosis Date Noted  . Need for hepatitis C screening test 01/24/2020  . Hyperglycemia 01/24/2020  . Ovarian cyst 11/26/2017  . Obesity (BMI 35.0-39.9 without comorbidity) 07/14/2017  . Multinodular goiter 07/17/2016  . Migraine 06/30/2016  .  Plantar fasciitis of left foot  09/16/2013  . LBP (low back pain) 09/05/2013  . Hypertension 04/05/2013  . Vaginitis and vulvovaginitis 03/15/2013  . Hydradenitis 08/20/2011    Arbie Blankley 06/18/2020, [place] [place], [place], [address] Phone: [phone]   Fax:  [phone]  Name: Amber Reilly MRN: [id number] Date of Birth: 10/11/1965

## 2020-06-21 ENCOUNTER — Encounter: Payer: 59 | Admitting: Physical Therapy

## 2020-08-16 ENCOUNTER — Other Ambulatory Visit: Payer: Self-pay

## 2020-08-16 ENCOUNTER — Other Ambulatory Visit (HOSPITAL_COMMUNITY)
Admission: RE | Admit: 2020-08-16 | Discharge: 2020-08-16 | Disposition: A | Discharge status: Home / Self Care | Payer: 59 | Source: Ambulatory Visit | Attending: Obstetrics and Gynecology | Admitting: Obstetrics and Gynecology

## 2020-08-16 ENCOUNTER — Ambulatory Visit (INDEPENDENT_AMBULATORY_CARE_PROVIDER_SITE_OTHER): Payer: 59 | Admitting: Obstetrics and Gynecology

## 2020-08-16 ENCOUNTER — Encounter: Payer: Self-pay | Admitting: Obstetrics and Gynecology

## 2020-08-16 VITALS — BP 117/75 | HR 85 | Ht 64.0 in | Wt 231.0 lb

## 2020-08-16 DIAGNOSIS — Z01419 Encounter for gynecological examination (general) (routine) without abnormal findings: Secondary | ICD-10-CM | POA: Insufficient documentation

## 2020-08-16 NOTE — Progress Notes (Signed)
Subjective:     Amber Reilly is a 55 y.o. female postmenopausal with BMI 39 who is here for a comprehensive physical exam. The patient reports no problems. She is sexually active without complaints. She denies pelvic pain or abnormal discharge. She denies urinary incontinence. She denies any episodes of postmenopausal vaginal bleeding. She endorses night sweats and recently started black kohosh which seems to help her symptoms. Patient is without any other complaints  Past Medical History:  Diagnosis Date  . Hypertension    Past Surgical History:  Procedure Laterality Date  . CESAREAN SECTION    . ENDOMETRIAL ABLATION    . FOOT SURGERY Left   . KNEE SURGERY Left   . LAPAROTOMY N/A 11/26/2017   Procedure: EXPLORATORY LAPAROTOMY;  Surgeon: [name], MD;  [facility]: [facility];  Service: Gynecology;  Laterality: N/A;  . UMBILICAL HERNIA REPAIR N/A 11/26/2017   Procedure: HERNIA REPAIR UMBILICAL ADULT;  Surgeon: [name], MD;  Location: [facility];  Service: Gynecology;  Laterality: N/A;  . UNILATERAL SALPINGECTOMY Right 11/26/2017   Procedure: RIGHT SALPINGO-OOPHORECTOMY;  Surgeon: [name], MD;  Location: [facility];  Service: Gynecology;  Laterality: Right;   Family History  Problem Relation Age of Onset  . Heart attack Father   . Cancer Other   . Goiter Paternal Aunt   . Colon cancer Neg Hx   . Esophageal cancer Neg Hx   . Stomach cancer Neg Hx      Social History   Socioeconomic History  . Marital status: Legally Separated    Spouse name: Not on file  . Number of children: Not on file  . Years of education: Not on file  . Highest education level: Not on file  Occupational History  . Not on file  Tobacco Use  . Smoking status: Never Smoker  . Smokeless tobacco: Never Used  Vaping Use  . Vaping Use: Never used  Substance and Sexual Activity  . Alcohol use: No  . Drug use: No  . Sexual activity: Yes  Other Topics Concern  . Not on file  Social History Narrative  .  Not on file   Social Determinants of Health   Financial Resource Strain: Not on file  Food Insecurity: Not on file  Transportation Needs: Not on file  Physical Activity: Not on file  Stress: Not on file  Social Connections: Not on file  Intimate Partner Violence: Not on file   Health Maintenance  Topic Date Due  . PAP SMEAR-Modifier  01/02/2018  . COVID-19 Vaccine (3 - Booster for Pfizer series) 12/25/2019  . INFLUENZA VACCINE  10/29/2020  . TETANUS/TDAP  05/14/2021  . MAMMOGRAM  09/06/2021  . COLONOSCOPY (Pts 45-38yrs Insurance coverage will need to be confirmed)  08/18/2022  . Hepatitis C Screening  Completed  . HIV Screening  Completed  . HPV VACCINES  Aged Out       Review of Systems Pertinent items noted in HPI and remainder of comprehensive ROS otherwise negative.   Objective:  Blood pressure 117/75, pulse 85, height 5\' 4"  (1.626 m), weight 231 lb (104.8 kg).     GENERAL: Well-developed, well-nourished female in no acute distress.  HEENT: Normocephalic, atraumatic. Sclerae anicteric.  NECK: Supple. Normal thyroid.  LUNGS: Clear to auscultation bilaterally.  HEART: Regular rate and rhythm. BREASTS: Symmetric in size. No palpable masses or lymphadenopathy, skin changes, or nipple drainage. ABDOMEN: Soft, nontender, nondistended. No organomegaly. PELVIC: Normal external female genitalia. Vagina is pink and rugated.  Normal discharge.  Normal appearing cervix. Uterus is normal in size.  No adnexal mass or tenderness. EXTREMITIES: No cyanosis, clubbing, or edema, 2+ distal pulses.    Assessment:    Healthy female exam.      Plan:    Pap smear collected Screening mammogram ordered Patient had a normal colonoscopy in 2019 Patient will be contacted with abnormal results See After Visit Summary for Counseling Recommendations

## 2020-08-21 ENCOUNTER — Other Ambulatory Visit: Payer: Self-pay | Admitting: Internal Medicine

## 2020-08-21 LAB — CYTOLOGY - PAP
Adequacy: ABSENT
Comment: NEGATIVE
Diagnosis: UNDETERMINED — AB
High risk HPV: NEGATIVE

## 2020-09-03 ENCOUNTER — Other Ambulatory Visit: Payer: Self-pay | Admitting: Obstetrics and Gynecology

## 2020-09-03 DIAGNOSIS — Z1231 Encounter for screening mammogram for malignant neoplasm of breast: Secondary | ICD-10-CM

## 2020-09-12 ENCOUNTER — Other Ambulatory Visit: Payer: Self-pay

## 2020-09-12 ENCOUNTER — Ambulatory Visit (INDEPENDENT_AMBULATORY_CARE_PROVIDER_SITE_OTHER): Payer: 59

## 2020-09-12 DIAGNOSIS — Z1231 Encounter for screening mammogram for malignant neoplasm of breast: Secondary | ICD-10-CM | POA: Diagnosis not present

## 2020-09-22 ENCOUNTER — Other Ambulatory Visit: Payer: Self-pay | Admitting: Internal Medicine

## 2020-10-02 ENCOUNTER — Telehealth: Payer: 59 | Admitting: Physician Assistant

## 2020-10-02 ENCOUNTER — Encounter: Payer: Self-pay | Admitting: Physician Assistant

## 2020-10-02 DIAGNOSIS — U071 COVID-19: Secondary | ICD-10-CM | POA: Diagnosis not present

## 2020-10-02 MED ORDER — BENZONATATE 100 MG PO CAPS: 100.0000 mg | ORAL_CAPSULE | Freq: Three times a day (TID) | ORAL | 0 refills | Status: DC | PRN

## 2020-10-02 MED ORDER — MOLNUPIRAVIR EUA 200MG CAPSULE: 4.0000 | ORAL_CAPSULE | Freq: Two times a day (BID) | ORAL | 0 refills | Status: AC

## 2020-10-02 NOTE — Patient Instructions (Signed)
Hello Amber Reilly,  You are being placed in the home monitoring program for COVID-19 (commonly known as Coronavirus).  This is because you are suspected to have the virus or are known to have the virus.  If you are unsure which group you fall into call your clinic.    As part of this program, you'll answer a daily questionnaire in the MyChart mobile app. You'll receive a notification through the MyChart app when the questionnaire is available. When you log in to MyChart, you'll see the tasks in your To Do activity.       Clinicians will see any answers that are concerning and take appropriate steps.  If at any point you are having a medical emergency, call 911.  If otherwise concerned call your clinic instead of coming into the clinic or hospital.  To keep from spreading the disease you should: Stay home and limit contact with other people as much as possible.  Wash your hands frequently. Cover your coughs and sneezes with a tissue, and throw used tissues in the trash.   Clean and disinfect frequently touched surfaces and objects.    Take care of yourself by: Staying home Resting Drinking fluids Take fever-reducing medications (Tylenol/Acetaminophen and Ibuprofen)  For more information on the disease go to the Centers for Disease Control and Prevention website     You are being prescribed MOLNUPIRAVIR for COVID-19 infection.   Please call the pharmacy or go through the drive through vs going inside if you are picking up the mediation yourself to prevent further spread. If prescribed to a [facility] affiliated pharmacy, a pharmacist will bring the medication out to your car.   ADMINISTRATION INSTRUCTIONS: Take with or without food. Swallow the tablets whole. Don't chew, crush, or break the medications because it might not work as well  For each dose of the medication, you should be taking FOUR tablets at one time, TWICE a day   Finish your full five-day course of Molnupiravir even if  you feel better before you're done. Stopping this medication too early can make it less effective to prevent severe illness related to [employer].    Molnupiravir is prescribed for YOU ONLY. Don't share it with others, even if they have similar symptoms as you. This medication might not be right for everyone.   Make sure to take steps to protect yourself and others while you're taking this medication in order to get well soon and to prevent others from getting sick with COVID-19.   **If you are of childbearing potential (any gender) - it is advised to not get pregnant while taking this medication and recommended that condoms are used for female partners the next 3 months after taking the medication out of extreme caution    COMMON SIDE EFFECTS: Diarrhea Nausea  Dizziness    If your COVID-19 symptoms get worse, get medical help right away. Call 911 if you experience symptoms such as worsening cough, trouble breathing, chest pain that doesn't go away, confusion, a hard time staying awake, and pale or blue-colored skin. This medication won't prevent all COVID-19 cases from getting worse.   Can take to lessen severity: Vit C 500mg  twice daily Quercertin 250-500mg  twice daily Zinc 75-100mg  daily Melatonin 3-6 mg at bedtime Vit D3 1000-2000 IU daily Aspirin 81 mg daily with food Optional: Famotidine 20mg  daily Also can add tylenol/ibuprofen as needed for fevers and body aches May add Mucinex or Mucinex DM as needed for cough/congestion  10 Things You Can Do  to Manage Your COVID-19 Symptoms at Home If you have possible or confirmed COVID-19 Stay home except to get medical care. Monitor your symptoms carefully. If your symptoms get worse, call your healthcare provider immediately. Get rest and stay hydrated. If you have a medical appointment, call the healthcare provider ahead of time and tell them that you have or may have COVID-19. For medical emergencies, call 911 and notify the dispatch  personnel that you have or may have COVID-19. Cover your cough and sneezes with a tissue or use the inside of your elbow. Wash your hands often with soap and water for at least 20 seconds or clean your hands with an alcohol-based hand sanitizer that contains at least 60% alcohol. As much as possible, stay in a specific room and away from other people in your home. Also, you should use a separate bathroom, if available. If you need to be around other people in or outside of the home, wear a mask. Avoid sharing personal items with other people in your household, like dishes, towels, and bedding. Clean all surfaces that are touched often, like counters, tabletops, and doorknobs. Use household cleaning sprays or wipes according to the label instructions. [email or web address] 10/14/2019 This information is not intended to replace advice given to you by your health care provider. Make sure you discuss any questions you have with your healthcare provider. Document Revised: 05/04/2020 Document Reviewed: 05/04/2020 Elsevier Patient Education  2022 [place]: What to Do if You Are Sick CDC has updated isolation and quarantine recommendations for the public, and is revising the CDC website to reflect these changes. These recommendations do not apply to healthcare personnel and do not supersede state, local, tribal, or territorial laws, rules, andregulations. If you have a fever, cough or other symptoms, you might have COVID-19. Most people have mild illness and are able to recover at home. If you are sick: Keep track of your symptoms. If you have an emergency warning sign (including trouble breathing), call 911. Steps to help prevent the spread of COVID-19 if you are sick If you are sick with COVID-19 or think you might have COVID-19, follow the steps below to care for yourself and to help protect other peoplein your home and community. Stay home except to get medical care Stay home. Most  people with COVID-19 have mild illness and can recover at home without medical care. Do not leave your home, except to get medical care. Do not visit public areas. Take care of yourself. Get rest and stay hydrated. Take over-the-counter medicines, such as acetaminophen, to help you feel better. Stay in touch with your doctor. Call before you get medical care. Be sure to get care if you have trouble breathing, or have any other emergency warning signs, or if you think it is an emergency. Avoid public transportation, ride-sharing, or taxis. Separate yourself from other people As much as possible, stay in a specific room and away from other people and pets in your home. If possible, you should use a separate bathroom. If you need to be around other people or animals in oroutside of the home, wear a mask. Tell your close contactsthat they may have been exposed to COVID-19. An infected person can spread COVID-19 starting 48 hours (or 2 days) before the person has any symptoms or tests positive. By letting your close contacts know they may have been exposed to COVID-19, you are helping to protect everyone. Additional guidance is available for those living in close quarters  and shared housing. See COVID-19 and Animals if you have questions about pets. If you are diagnosed with COVID-19, someone from the health department may call you. Answer the call to slow the spread. Monitor your symptoms Symptoms of COVID-19 include fever, cough, or other symptoms. Follow care instructions from your healthcare provider and local health department. Your local health authorities may give instructions on checking your symptoms and reporting information. When to seek emergency medical attention Look for emergency warning signs* for COVID-19. If someone is showing any of these signs, seek emergency medical care immediately: Trouble breathing Persistent pain or pressure in the chest New confusion Inability to wake or stay  awake Pale, gray, or blue-colored skin, lips, or nail beds, depending on skin tone *This list is not all possible symptoms. Please call your medical provider forany other symptoms that are severe or concerning to you. Call 911 or call ahead to your local emergency facility: Notify the operator that you are seeking care for someone who has or may haveCOVID-19. Call ahead before visiting your doctor Call ahead. Many medical visits for routine care are being postponed or done by phone or telemedicine. If you have a medical appointment that cannot be postponed, call your doctor's office, and tell them you have or may have COVID-19. This will help the office protect themselves and other patients. Get tested If you have symptoms of COVID-19, get tested. While waiting for test results, you stay away from others, including staying apart from those living in your household. Self-tests are one of several options for testing for the virus that causes COVID-19 and may be more convenient than laboratory-based tests and point-of-care tests. Ask your healthcare provider or your local health department if you need help interpreting your test results. You can visit your state, tribal, local, and territorial health department's website to look for the latest local information on testing sites. If you are sick, wear a mask over your nose and mouth You should wear a mask over your nose and mouth if you must be around other people or animals, including pets (even at home). You don't need to wear the mask if you are alone. If you can't put on a mask (because of trouble breathing, for example), cover your coughs and sneezes in some other way. Try to stay at least 6 feet away from other people. This will help protect the people around you. Masks should not be placed on young children under age 10 years, anyone who has trouble breathing, or anyone who is not able to remove the mask without help. Note: During the COVID-19  pandemic, medical grade facemasks are reserved forhealthcare workers and some first responders. Cover your coughs and sneezes Cover your mouth and nose with a tissue when you cough or sneeze. Throw away used tissues in a lined trash can. Immediately wash your hands with soap and water for at least 20 seconds. If soap and water are not available, clean your hands with an alcohol-based hand sanitizer that contains at least 60% alcohol. Clean your hands often Wash your hands often with soap and water for at least 20 seconds. This is especially important after blowing your nose, coughing, or sneezing; going to the bathroom; and before eating or preparing food. Use hand sanitizer if soap and water are not available. Use an alcohol-based hand sanitizer with at least 60% alcohol, covering all surfaces of your hands and rubbing them together until they feel dry. Soap and water are the best option, especially if  hands are visibly dirty. Avoid touching your eyes, nose, and mouth with unwashed hands. Handwashing Tips Avoid sharing personal household items Do not share dishes, drinking glasses, cups, eating utensils, towels, or bedding with other people in your home. Wash these items thoroughly after using them with soap and water or put in the dishwasher. Clean all "high-touch" surfaces every day Clean and disinfect high-touch surfaces in your "sick room" and bathroom; wear disposable gloves. Let someone else clean and disinfect surfaces in common areas, but you should clean your bedroom and bathroom, if possible. If a caregiver or other person needs to clean and disinfect a sick person's bedroom or bathroom, they should do so on an as-needed basis. The caregiver/other person should wear a mask and disposable gloves prior to cleaning. They should wait as long as possible after the person who is sick has used the bathroom before coming in to clean and use the bathroom. High-touch surfaces include phones, remote  controls, counters, tabletops, doorknobs, bathroom fixtures, toilets, keyboards, tablets, and bedside tables. Clean and disinfect areas that may have blood, stool, or body fluids on them. Use household cleaners and disinfectants. Clean the area or item with soap and water or another detergent if it is dirty. Then, use a household disinfectant. Be sure to follow the instructions on the label to ensure safe and effective use of the product. Many products recommend keeping the surface wet for several minutes to ensure germs are killed. Many also recommend precautions such as wearing gloves and making sure you have good ventilation during use of the product. Use a product from [employer] List N: Disinfectants for Coronavirus (OEVOJ-50). Complete Disinfection Guidance When you can be around others after being sick with COVID-19 Deciding when you can be around others is different for different situations. Find out when you can safely end home isolation. For any additional questions about your care,contact your healthcare provider or state or local health department. 03/07/2020 Content source: [facility] for Immun[facility] and Res[facility] ([facility]), Division of Viral Diseases This information is not intended to replace advice given to you by your health care provider. Make sure you discuss any questions you have with your healthcare provider. Document Revised: 05/04/2020 Document Reviewed: 05/04/2020 Elsevier Patient Education  [place].

## 2020-10-02 NOTE — Progress Notes (Signed)
Ms. Amber Reilly, hollars are scheduled for a virtual visit with your provider today.    Just as we do with appointments in the office, we must obtain your consent to participate.  Your consent will be active for this visit and any virtual visit you may have with one of our providers in the next 365 days.    If you have a MyChart account, I can also send a copy of this consent to you electronically.  All virtual visits are billed to your insurance company just like a traditional visit in the office.  As this is a virtual visit, video technology does not allow for your provider to perform a traditional examination.  This may limit your provider's ability to fully assess your condition.  If your provider identifies any concerns that need to be evaluated in person or the need to arrange testing such as labs, EKG, etc, we will make arrangements to do so.    Although advances in technology are sophisticated, we cannot ensure that it will always work on either your end or our end.  If the connection with a video visit is poor, we may have to switch to a telephone visit.  With either a video or telephone visit, we are not always able to ensure that we have a secure connection.   I need to obtain your verbal consent now.   Are you willing to proceed with your visit today?   LAVANDA NEVELS has provided verbal consent on 10/02/2020 for a virtual visit (video or telephone).   Mar Daring, PA-C 10/02/2020  9:38 AM  Virtual Visit Consent   [name], you are scheduled for a virtual visit with a [facility] provider today.     Just as with appointments in the office, your consent must be obtained to participate.  Your consent will be active for this visit and any virtual visit you may have with one of our providers in the next 365 days.     If you have a MyChart account, a copy of this consent can be sent to you electronically.  All virtual visits are billed to your insurance company just like a traditional  visit in the office.    As this is a virtual visit, video technology does not allow for your provider to perform a traditional examination.  This may limit your provider's ability to fully assess your condition.  If your provider identifies any concerns that need to be evaluated in person or the need to arrange testing (such as labs, EKG, etc.), we will make arrangements to do so.     Although advances in technology are sophisticated, we cannot ensure that it will always work on either your end or our end.  If the connection with a video visit is poor, the visit may have to be switched to a telephone visit.  With either a video or telephone visit, we are not always able to ensure that we have a secure connection.     I need to obtain your verbal consent now.   Are you willing to proceed with your visit today?    JACQLYN MAROLF has provided verbal consent on 10/02/2020 for a virtual visit (video or telephone).   Mar Daring, PA-C   Date: 10/02/2020 9:38 AM   Virtual Visit via Video Note   I, Mar Daring, connected with  Amber Reilly  ([id number], 03-11-1966) on 10/02/20 at  9:30 AM EDT by a video-enabled telemedicine application  and verified that I am speaking with the correct person using two identifiers.  Location: Patient: Virtual Visit Location Patient: Home Provider: Virtual Visit Location Provider: Home Office   I discussed the limitations of evaluation and management by telemedicine and the availability of in person appointments. The patient expressed understanding and agreed to proceed.    History of Present Illness: Amber Reilly is a 55 y.o. who identifies as a female who was assigned female at birth, and is being seen today for covid 19, tested positive yesterday 10/01/20.  HPI: URI  This is a new problem. Episode onset: family traveled to [place] for 5 days and travelled home on Thursday. Her symptoms started on Saturday. The problem has been gradually  worsening. Maximum temperature: felt feverish but unable to monitor temperature. Associated symptoms include congestion, coughing, diarrhea, rhinorrhea, sinus pain and a sore throat (scratchy). Pertinent negatives include no ear pain, headaches, nausea, plugged ear sensation or vomiting. Associated symptoms comments: Chills, fatigue, eyes burning. Treatments tried: Maximum strength Alka seltzer cough and cold. The treatment provided mild relief.     Problems:  Patient Active Problem List   Diagnosis Date Noted   Need for hepatitis C screening test 01/24/2020   Hyperglycemia 01/24/2020   Ovarian cyst 11/26/2017   Obesity (BMI 35.0-39.9 without comorbidity) 07/14/2017   Multinodular goiter 07/17/2016   Migraine 06/30/2016   Plantar fasciitis of left foot 09/16/2013   LBP (low back pain) 09/05/2013   Hypertension 04/05/2013   Vaginitis and vulvovaginitis 03/15/2013   Hydradenitis 08/20/2011    Allergies:  Allergies  Allergen Reactions   Tramadol Other (See Comments)    Bad headache   Latex Swelling    Selling at the site of contact only   Morphine And Related     Patient does not like the way it makes her feel    Medications:  Current Outpatient Medications:    benzonatate (TESSALON) 100 MG capsule, Take 1 capsule (100 mg total) by mouth 3 (three) times daily as needed., Disp: 30 capsule, Rfl: 0   molnupiravir EUA 200 mg CAPS, Take 4 capsules (800 mg total) by mouth 2 (two) times daily for 5 days., Disp: 40 capsule, Rfl: 0   Cholecalciferol (VITAMIN D3) 2000 UNITS capsule, Take 1 capsule (2,000 Units total) by mouth daily. (Patient not taking: Reported on 08/16/2020), Disp: 100 capsule, Rfl: 3   telmisartan-hydrochlorothiazide (MICARDIS HCT) 40-12.5 MG tablet, TAKE 1 TABLET BY MOUTH DAILY, Disp: 30 tablet, Rfl: 2  Observations/Objective: Patient is well-developed, well-nourished in no acute distress.  Resting comfortably at home.  Head is normocephalic, atraumatic.  No labored  breathing.  Speech is clear and coherent with logical content.  Patient is alert and oriented at baseline.    Assessment and Plan: 1. COVID-19 - MyChart COVID-19 home monitoring program; Future - molnupiravir EUA 200 mg CAPS; Take 4 capsules (800 mg total) by mouth 2 (two) times daily for 5 days.  Dispense: 40 capsule; Refill: 0 - benzonatate (TESSALON) 100 MG capsule; Take 1 capsule (100 mg total) by mouth 3 (three) times daily as needed.  Dispense: 30 capsule; Refill: 0 - Continue OTC symptomatic management of choice - Will send OTC vitamins and supplement information through AVS - Molnupiravir and tessalon perles prescribed for patient as noted above - Patient enrolled in MyChart symptom monitoring - Push fluids - Rest as needed - Discussed return precautions and when to seek in-person evaluation, sent via AVS as well   Follow Up Instructions: I discussed the  assessment and treatment plan with the patient. The patient was provided an opportunity to ask questions and all were answered. The patient agreed with the plan and demonstrated an understanding of the instructions.  A copy of instructions were sent to the patient via MyChart.  The patient was advised to call back or seek an in-person evaluation if the symptoms worsen or if the condition fails to improve as anticipated.  Time:  I spent 12 minutes with the patient via telehealth technology discussing the above problems/concerns.    Mar Daring, PA-C

## 2020-10-10 ENCOUNTER — Ambulatory Visit (INDEPENDENT_AMBULATORY_CARE_PROVIDER_SITE_OTHER): Payer: 59 | Admitting: Internal Medicine

## 2020-10-10 ENCOUNTER — Other Ambulatory Visit: Payer: Self-pay

## 2020-10-10 ENCOUNTER — Encounter: Payer: Self-pay | Admitting: Internal Medicine

## 2020-10-10 DIAGNOSIS — R739 Hyperglycemia, unspecified: Secondary | ICD-10-CM

## 2020-10-10 DIAGNOSIS — I10 Essential (primary) hypertension: Secondary | ICD-10-CM | POA: Diagnosis not present

## 2020-10-10 DIAGNOSIS — Z0000 Encounter for general adult medical examination without abnormal findings: Secondary | ICD-10-CM | POA: Diagnosis not present

## 2020-10-10 MED ORDER — DICLOFENAC SODIUM 1 % EX GEL: 1.0000 "application " | Freq: Four times a day (QID) | CUTANEOUS | 3 refills | Status: DC

## 2020-10-10 MED ORDER — VITAMIN D3 25 MCG (1000 UT) PO CAPS: 1000.0000 [IU] | ORAL_CAPSULE | Freq: Every day | ORAL | 3 refills | Status: DC

## 2020-10-10 MED ORDER — TELMISARTAN-HCTZ 40-12.5 MG PO TABS: 1.0000 | ORAL_TABLET | Freq: Every day | ORAL | 3 refills | Status: DC

## 2020-10-10 MED ORDER — DIPHENOXYLATE-ATROPINE 2.5-0.025 MG PO TABS: 1.0000 | ORAL_TABLET | Freq: Four times a day (QID) | ORAL | 0 refills | Status: DC | PRN

## 2020-10-10 NOTE — Assessment & Plan Note (Signed)
We discussed age appropriate health related issues, including available/recomended screening tests and vaccinations. We discussed a need for adhering to healthy diet and exercise. Labs/EKG were reviewed/ordered. All questions were answered. Mammo q 12 mo LMP - Oct 2014 GYN q 12 mo

## 2020-10-10 NOTE — Assessment & Plan Note (Signed)
Micardis HCT 

## 2020-10-10 NOTE — Assessment & Plan Note (Signed)
Check A1c. 

## 2020-10-10 NOTE — Patient Instructions (Signed)
De Quervain's Tenosynovitis  De Quervain's tenosynovitis is a condition that causes inflammation of the tendon on the thumb side of the wrist. Tendons are cords of tissue that connect bones to muscles. The tendons in the hand pass through a tunnel called a sheath. A slippery layer of tissue (synovium) lets the tendons move smoothly in the sheath. With de Quervain'stenosynovitis, the sheath swells or thickens, causing friction and pain. The condition is also called de Quervain's disease and de Quervain's syndrome.It occurs most often in women who are 54-53 years old. What are the causes? The exact cause of this condition is not known. It may be associated withoveruse of the hand and wrist. What increases the risk? You are more likely to develop this condition if you: Use your hands far more than normal, especially if you repeat certain movements that involve twisting your hand or using a tight grip. Are pregnant. Are a middle-aged woman. Have rheumatoid arthritis. Have diabetes. What are the signs or symptoms? The main symptom of this condition is pain on the thumb side of the wrist. The pain may get worse when you grasp something or turn your wrist. Other symptoms may include: Pain that extends up the forearm. Swelling of your wrist and hand. Trouble moving the thumb and wrist. A sensation of snapping in the wrist. A bump filled with fluid (cyst) in the area of the pain. How is this diagnosed? This condition may be diagnosed based on: Your symptoms and medical history. A physical exam. During the exam, your health care provider may do a simple test Wynn Maudlin test) that involves pulling your thumb and wrist to see if this causes pain. You may also need to have an X-ray or ultrasound. How is this treated? Treatment for this condition may include: Avoiding any activity that causes pain and swelling. Taking medicines. Anti-inflammatory medicines and corticosteroid injections may be used to  reduce inflammation and relieve pain. Wearing a splint. Having surgery. This may be needed if other treatments do not work. Once the pain and swelling have gone down, you may start: Physical therapy. This includes exercises to improve movement and strength in your wrist and thumb. Occupational therapy. This includes adjusting how you move your wrist. Follow these instructions at home: If you have a splint: Wear the splint as told by your health care provider. Remove it only as told by your health care provider. Loosen the splint if your fingers tingle, become numb, or turn cold and blue. Keep the splint clean. If the splint is not waterproof: Do not let it get wet. Cover it with a watertight covering when you take a bath or a shower. Managing pain, stiffness, and swelling  Avoid movements and activities that cause pain and swelling in the wrist area. If directed, put ice on the painful area. This may be helpful after doing activities that involve the sore wrist. To do this: Put ice in a plastic bag. Place a towel between your skin and the bag. Leave the ice on for 20 minutes, 2-3 times a day. Remove the ice if your skin turns bright red. This is very important. If you cannot feel pain, heat, or cold, you have a greater risk of damage to the area. Move your fingers often to reduce stiffness and swelling. Raise (elevate) the injured area above the level of your heart while you are sitting or lying down.  General instructions Return to your normal activities as told by your health care provider. Ask your health care  provider what activities are safe for you. Take over-the-counter and prescription medicines only as told by your health care provider. Keep all follow-up visits. This is important. Contact a health care provider if: Your pain medicine does not help. Your pain gets worse. You develop new symptoms. Summary De Quervain's tenosynovitis is a condition that causes inflammation of  the tendon on the thumb side of the wrist. The condition occurs most often in women who are 41-3 years old. The exact cause of this condition is not known. It may be associated with overuse of the hand and wrist. Treatment starts with avoiding activity that causes pain or swelling in the wrist area. Other treatments may include wearing a splint and taking medicine. Sometimes, surgery is needed. This information is not intended to replace advice given to you by your health care provider. Make sure you discuss any questions you have with your healthcare provider. Document Revised: 06/29/2019 Document Reviewed: 06/29/2019 Elsevier Patient Education  2022 [employer].

## 2020-10-10 NOTE — Progress Notes (Addendum)
Subjective:  Patient ID: Amber Reilly, female    DOB: November 27, 1965  Age: 55 y.o. MRN: [id number]  CC: Annual Exam   HPI Amber Reilly presents for a well exam C/o diarrhea x 1 wk post-covid R TKR  Outpatient Medications Prior to Visit  Medication Sig Dispense Refill   Multiple Vitamins-Minerals (MULTIVITAMIN WOMEN 50+ PO) Take 1 tablet by mouth daily.     telmisartan-hydrochlorothiazide (MICARDIS HCT) 40-12.5 MG tablet TAKE 1 TABLET BY MOUTH DAILY 30 tablet 2   benzonatate (TESSALON) 100 MG capsule Take 1 capsule (100 mg total) by mouth 3 (three) times daily as needed. (Patient not taking: Reported on 10/10/2020) 30 capsule 0   Cholecalciferol (VITAMIN D3) 2000 UNITS capsule Take 1 capsule (2,000 Units total) by mouth daily. (Patient not taking: Reported on 08/16/2020) 100 capsule 3   No facility-administered medications prior to visit.    ROS: Review of Systems  Constitutional:  Negative for activity change, appetite change, chills, fatigue and unexpected weight change.  HENT:  Negative for congestion, mouth sores and sinus pressure.   Eyes:  Negative for visual disturbance.  Respiratory:  Positive for cough. Negative for chest tightness.   Gastrointestinal:  Positive for diarrhea. Negative for abdominal pain and nausea.  Genitourinary:  Negative for difficulty urinating, frequency and vaginal pain.  Musculoskeletal:  Negative for back pain and gait problem.  Skin:  Negative for pallor and rash.  Neurological:  Negative for dizziness, tremors, weakness, numbness and headaches.  Psychiatric/Behavioral:  Negative for confusion and sleep disturbance.    Objective:  BP 112/72 (BP Location: Left Arm)   Pulse 87   Temp 98.4 F (36.9 C) (Oral)   Ht 5\' 4"  (1.626 m)   Wt 230 lb 9.6 oz (104.6 kg)   SpO2 98%   BMI 39.58 kg/m   BP Readings from Last 3 Encounters:  10/10/20 112/72  08/16/20 117/75  01/24/20 126/76    Wt Readings from Last 3 Encounters:  10/10/20 230 lb  9.6 oz (104.6 kg)  08/16/20 231 lb (104.8 kg)  01/24/20 238 lb (108 kg)    Physical Exam Constitutional:      General: She is not in acute distress.    Appearance: She is well-developed. She is obese.  HENT:     Head: Normocephalic.     Right Ear: External ear normal.     Left Ear: External ear normal.     Nose: Nose normal.  Eyes:     General:        Right eye: No discharge.        Left eye: No discharge.     Conjunctiva/sclera: Conjunctivae normal.     Pupils: Pupils are equal, round, and reactive to light.  Neck:     Thyroid: No thyromegaly.     Vascular: No JVD.     Trachea: No tracheal deviation.  Cardiovascular:     Rate and Rhythm: Normal rate and regular rhythm.     Heart sounds: Normal heart sounds.  Pulmonary:     Effort: No respiratory distress.     Breath sounds: No stridor. No wheezing.  Abdominal:     General: Bowel sounds are normal. There is no distension.     Palpations: Abdomen is soft. There is no mass.     Tenderness: There is no abdominal tenderness. There is no guarding or rebound.  Musculoskeletal:        General: No tenderness.     Cervical back: Normal range of  motion and neck supple. No rigidity.  Lymphadenopathy:     Cervical: No cervical adenopathy.  Skin:    Findings: No erythema or rash.  Neurological:     Cranial Nerves: No cranial nerve deficit.     Motor: No abnormal muscle tone.     Coordination: Coordination normal.     Deep Tendon Reflexes: Reflexes normal.  Psychiatric:        Behavior: Behavior normal.        Thought Content: Thought content normal.        Judgment: Judgment normal.  Goiter Lab Results  Component Value Date   WBC 4.3 01/24/2020   HGB 12.0 01/24/2020   HCT 37.4 01/24/2020   PLT 233.0 01/24/2020   GLUCOSE 83 01/24/2020   CHOL 221 (H) 01/24/2020   TRIG 233.0 (H) 01/24/2020   HDL 47.70 01/24/2020   LDLDIRECT 145.0 01/24/2020   LDLCALC 130 (H) 12/24/2011   ALT 23 01/24/2020   AST 20 01/24/2020   NA 139  01/24/2020   K 3.5 01/24/2020   CL 100 01/24/2020   CREATININE 1.07 01/24/2020   BUN 16 01/24/2020   CO2 32 01/24/2020   TSH 0.44 01/24/2020   HGBA1C 5.8 01/24/2020    No results found.  Assessment & Plan:     Follow-up: No follow-ups on file.  Walker Kehr, MD

## 2021-04-02 IMAGING — MG DIGITAL SCREENING BILAT W/ TOMO W/ CAD
8 series · 8 of 24 positions shown · non-contrast
Comparison: Previous exam(s).

CLINICAL DATA: Screening.

EXAM:
DIGITAL SCREENING BILATERAL MAMMOGRAM WITH TOMO AND CAD

[R MLO synth-2D]
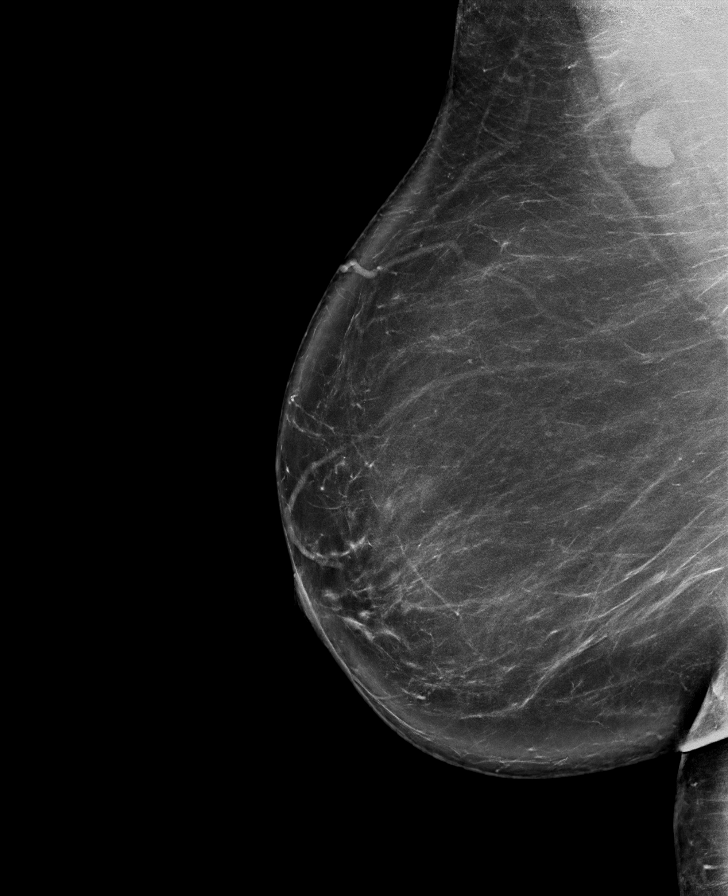

[L MLO synth-2D]
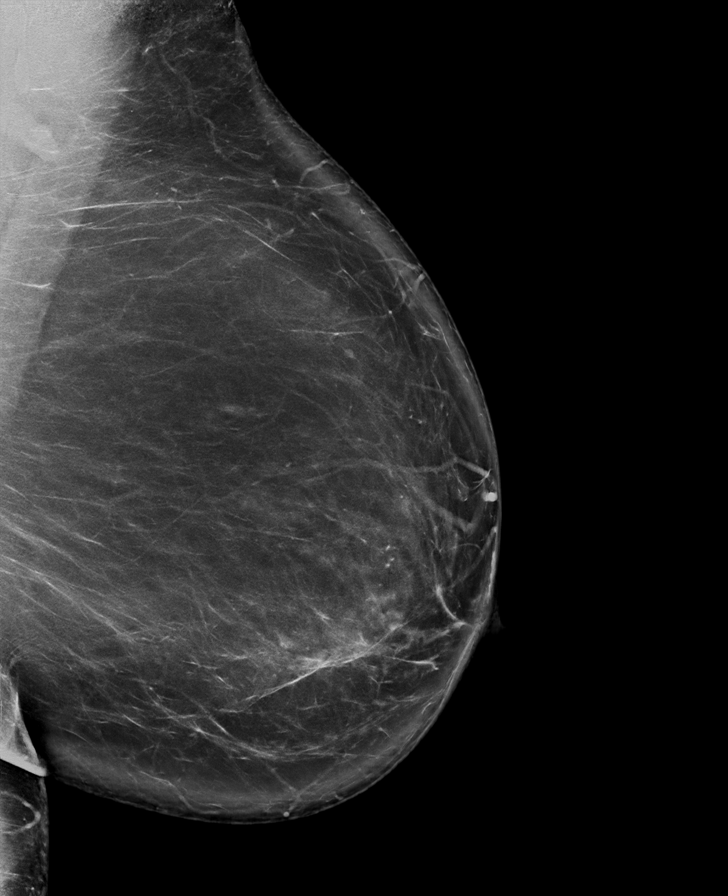

[R CC synth-2D]
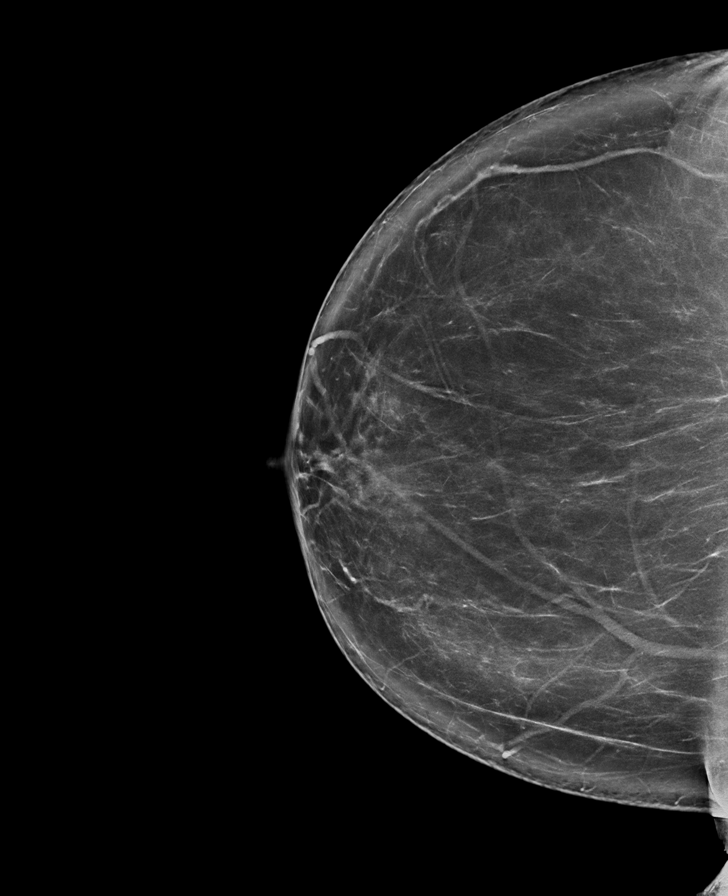

[L CC synth-2D]
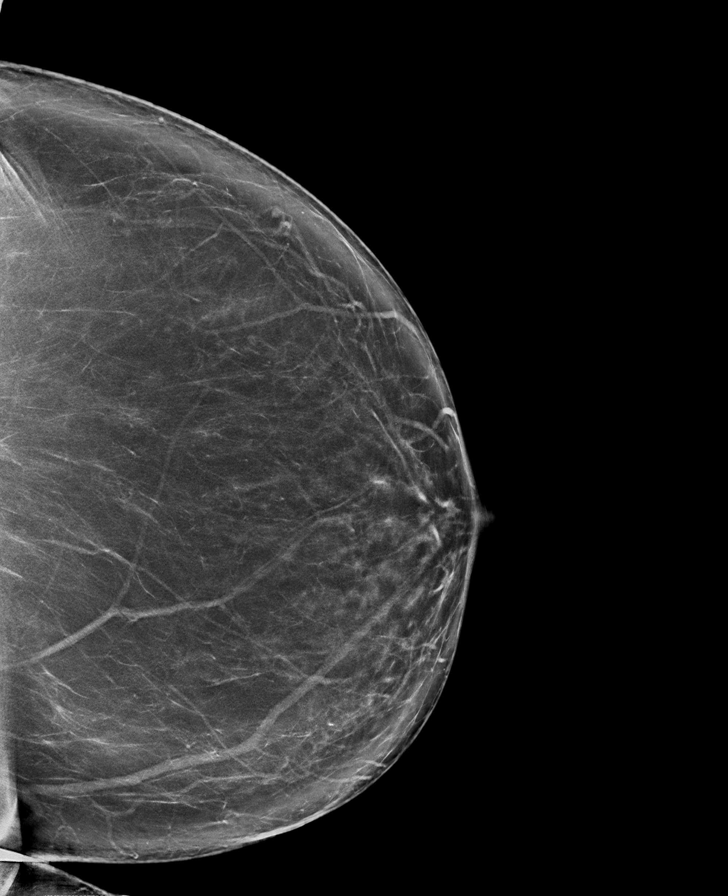

[L CC tomo · tomo slice 49/97.0]
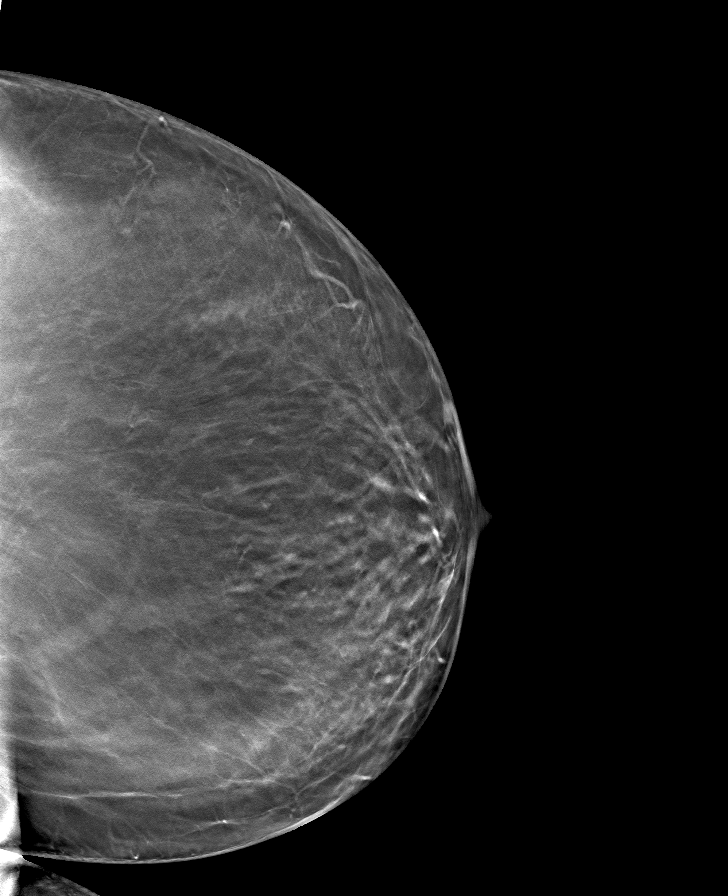

[L MLO tomo · tomo slice 53/106.0]
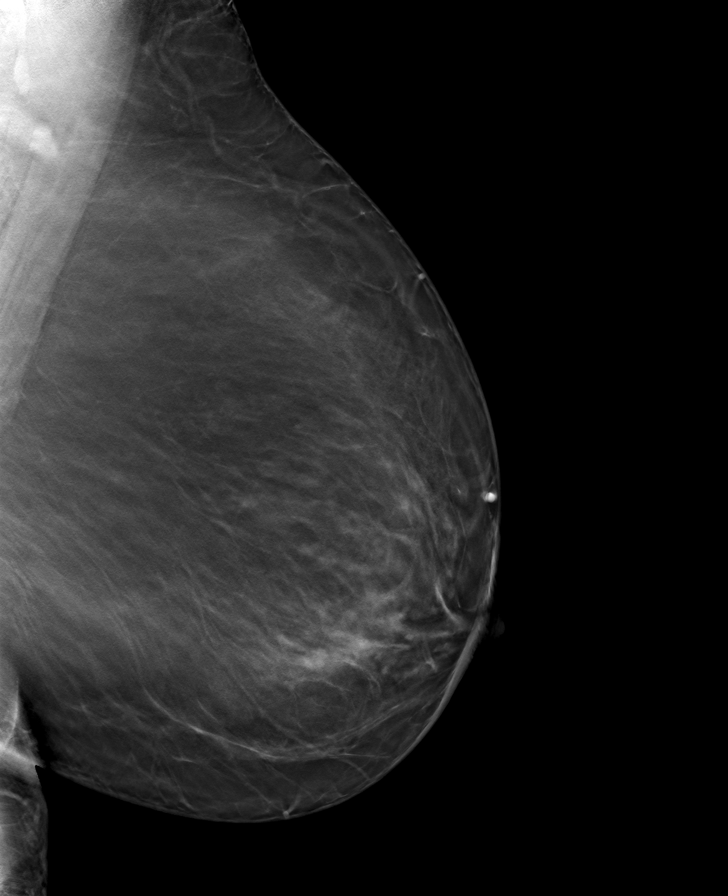

[R CC tomo · tomo slice 49/96.0]
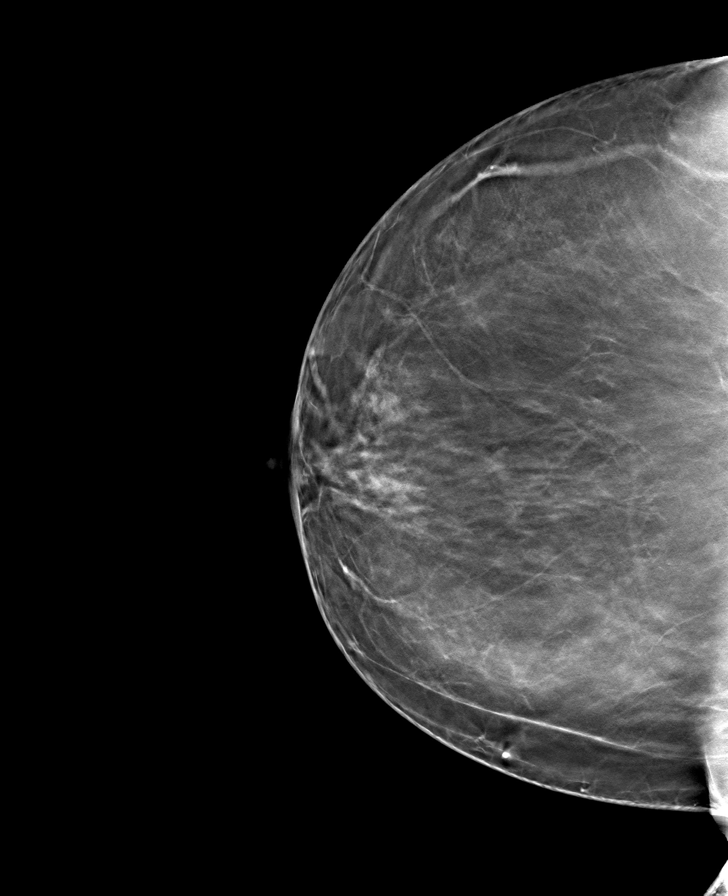

[R MLO tomo · tomo slice 53/106.0]
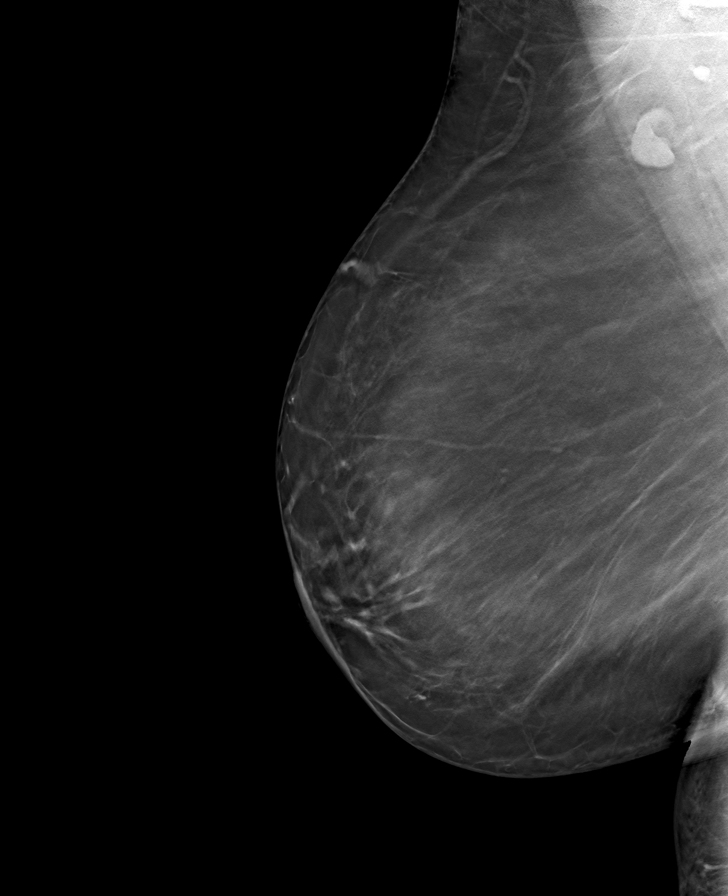

[8 of 24 positions shown; findings below may reference images not displayed]

ACR Breast Density Category b: There are scattered areas of
fibroglandular density.
FINDINGS: There are no findings suspicious for malignancy. Images were
processed with CAD.
IMPRESSION: No mammographic evidence of malignancy. A result letter of this
screening mammogram will be mailed directly to the patient.

RECOMMENDATION:
Screening mammogram in one year. (Code:CN-U-775)

BI-RADS CATEGORY  1: Negative.

## 2021-06-18 ENCOUNTER — Ambulatory Visit (INDEPENDENT_AMBULATORY_CARE_PROVIDER_SITE_OTHER): Payer: 59 | Admitting: Endocrinology

## 2021-06-18 ENCOUNTER — Other Ambulatory Visit: Payer: Self-pay

## 2021-06-18 VITALS — BP 132/78 | HR 101 | Ht 64.0 in | Wt 236.2 lb

## 2021-06-18 DIAGNOSIS — E042 Nontoxic multinodular goiter: Secondary | ICD-10-CM | POA: Diagnosis not present

## 2021-06-18 LAB — T4, FREE: Free T4: 1.01 ng/dL (ref 0.60–1.60)

## 2021-06-18 LAB — TSH: TSH: 0.44 u[IU]/mL (ref 0.35–5.50)

## 2021-06-18 NOTE — Progress Notes (Signed)
? ?Subjective:  ? ? Patient ID: Amber Reilly, female    DOB: Oct 11, 1965, 56 y.o.   MRN: [id number] ? ?HPI ?Pt returns for f/u of MNG (dx'ed 2006; bx in 2013 showed BFN;  f/u US in 2021 advised bx--showed BFN (cat 2); nuc med scan showed MNG, but no hot or cold nodule; TSH has been borderline low).  She does not notice the goiter.   ?Past Medical History:  ?Diagnosis Date  ? Hypertension   ? ? ?Past Surgical History:  ?Procedure Laterality Date  ? CESAREAN SECTION    ? ENDOMETRIAL ABLATION    ? FOOT SURGERY Left   ? KNEE SURGERY Left   ? LAPAROTOMY N/A 11/26/2017  ? Procedure: EXPLORATORY LAPAROTOMY;  Surgeon: [name], MD;  Location: [facility];  Service: Gynecology;  Laterality: N/A;  ? UMBILICAL HERNIA REPAIR N/A 11/26/2017  ? Procedure: HERNIA REPAIR UMBILICAL ADULT;  Surgeon: [name], MD;  Location: [facility];  Service: Gynecology;  Laterality: N/A;  ? UNILATERAL SALPINGECTOMY Right 11/26/2017  ? Procedure: RIGHT SALPINGO-OOPHORECTOMY;  Surgeon: [name], MD;  Location: [facility];  Service: Gynecology;  Laterality: Right;  ? ? ?Social History  ? ?Socioeconomic History  ? Marital status: Legally Separated  ?  Spouse name: Not on file  ? Number of children: Not on file  ? Years of education: Not on file  ? Highest education level: Not on file  ?Occupational History  ? Not on file  ?Tobacco Use  ? Smoking status: Never  ? Smokeless tobacco: Never  ?Vaping Use  ? Vaping Use: Never used  ?Substance and Sexual Activity  ? Alcohol use: No  ? Drug use: No  ? Sexual activity: Yes  ?Other Topics Concern  ? Not on file  ?Social History Narrative  ? Not on file  ? ?Social Determinants of Health  ? ?Financial Resource Strain: Not on file  ?Food Insecurity: Not on file  ?Transportation Needs: Not on file  ?Physical Activity: Not on file  ?Stress: Not on file  ?Social Connections: Not on file  ?Intimate Partner Violence: Not on file  ? ? ?Current Outpatient Medications on File Prior to Visit  ?Medication Sig Dispense Refill  ?  Cholecalciferol (VITAMIN D3) 25 MCG (1000 UT) CAPS Take 1 capsule (1,000 Units total) by mouth daily. 100 capsule 3  ? diclofenac Sodium (VOLTAREN) 1 % GEL Apply 1 application topically 4 (four) times daily. 100 g 3  ? diphenoxylate-atropine (LOMOTIL) 2.5-0.025 MG tablet Take 1 tablet by mouth 4 (four) times daily as needed for diarrhea or loose stools. 40 tablet 0  ? Multiple Vitamins-Minerals (MULTIVITAMIN WOMEN 50+ PO) Take 1 tablet by mouth daily.    ? telmisartan-hydrochlorothiazide (MICARDIS HCT) 40-12.5 MG tablet Take 1 tablet by mouth daily. 90 tablet 3  ? ?No current facility-administered medications on file prior to visit.  ? ? ?Allergies  ?Allergen Reactions  ? Tramadol Other (See Comments)  ?  Bad headache  ? Latex Swelling  ?  Selling at the site of contact only  ? Morphine And Related   ?  Patient does not like the way it makes her feel   ? ? ?Family History  ?Problem Relation Age of Onset  ? Heart attack Father   ? Cancer Other   ? Goiter Paternal Aunt   ? Colon cancer Neg Hx   ? Esophageal cancer Neg Hx   ? Stomach cancer Neg Hx   ? ? ?BP 132/78 (BP Location: Left Arm, Patient  Position: Sitting, Cuff Size: Normal)   Pulse (!) 101   Ht '5\' 4"'$  (1.626 m)   Wt 236 lb 3.2 oz (107.1 kg)   SpO2 97%   BMI 40.54 kg/m?  ? ? ?Review of Systems ? ?   ?Objective:  ? Physical Exam ?VITAL SIGNS:  See vs page ?GENERAL: no distress ?NECK: thyroid is approx 10x normal size (R>L).  I can palpate 2 large right sided nodules.   ? ?Lab Results  ?Component Value Date  ? TSH 0.44 06/18/2021  ? ? ?   ?Assessment & Plan:  ?MNG, due for recheck.  No medication is needed.  I ordered f/u US.   ? ?

## 2021-06-18 NOTE — Patient Instructions (Addendum)
Blood tests are requested for you today.  We'll let you know about the results.   ?Let's recheck the ultrasound.  you will receive a phone call, about a day and time for an appointment.   ?If it is unchanged, please come back for a follow-up appointment in 2 years.   ?

## 2021-06-19 ENCOUNTER — Ambulatory Visit (HOSPITAL_BASED_OUTPATIENT_CLINIC_OR_DEPARTMENT_OTHER)
Admission: RE | Admit: 2021-06-19 | Discharge: 2021-06-19 | Disposition: A | Discharge status: Home / Self Care | Payer: 59 | Source: Ambulatory Visit | Attending: Endocrinology | Admitting: Endocrinology

## 2021-06-19 DIAGNOSIS — E042 Nontoxic multinodular goiter: Secondary | ICD-10-CM | POA: Diagnosis not present

## 2021-06-19 DIAGNOSIS — E041 Nontoxic single thyroid nodule: Secondary | ICD-10-CM | POA: Diagnosis not present

## 2021-06-29 ENCOUNTER — Other Ambulatory Visit: Payer: Self-pay | Admitting: Internal Medicine

## 2021-10-10 ENCOUNTER — Encounter: Payer: Self-pay | Admitting: Internal Medicine

## 2021-10-15 ENCOUNTER — Other Ambulatory Visit: Payer: Self-pay | Admitting: Internal Medicine

## 2021-10-15 ENCOUNTER — Ambulatory Visit (INDEPENDENT_AMBULATORY_CARE_PROVIDER_SITE_OTHER): Payer: 59 | Admitting: Internal Medicine

## 2021-10-15 ENCOUNTER — Encounter: Payer: Self-pay | Admitting: Internal Medicine

## 2021-10-15 VITALS — BP 104/76 | HR 82 | Temp 98.1°F | Ht 64.0 in | Wt 227.4 lb

## 2021-10-15 DIAGNOSIS — R739 Hyperglycemia, unspecified: Secondary | ICD-10-CM | POA: Diagnosis not present

## 2021-10-15 DIAGNOSIS — I10 Essential (primary) hypertension: Secondary | ICD-10-CM

## 2021-10-15 DIAGNOSIS — Z0000 Encounter for general adult medical examination without abnormal findings: Secondary | ICD-10-CM | POA: Diagnosis not present

## 2021-10-15 DIAGNOSIS — L578 Other skin changes due to chronic exposure to nonionizing radiation: Secondary | ICD-10-CM | POA: Diagnosis not present

## 2021-10-15 DIAGNOSIS — E669 Obesity, unspecified: Secondary | ICD-10-CM

## 2021-10-15 MED ORDER — TRIAMCINOLONE ACETONIDE 0.1 % EX CREA: 1.0000 | TOPICAL_CREAM | Freq: Four times a day (QID) | CUTANEOUS | 1 refills | Status: DC

## 2021-10-15 MED ORDER — METHYLPREDNISOLONE 4 MG PO TBPK: ORAL_TABLET | ORAL | 0 refills | Status: DC

## 2021-10-15 NOTE — Progress Notes (Signed)
Subjective:  Patient ID: Amber Reilly, female    DOB: Apr 16, 1965  Age: 56 y.o. MRN: [id number]  CC: Rash (Wants to have some rash checked ( which is peeling)) and Annual Exam (Talk about weight)   HPI Amber Reilly presents for rash.  Amber Reilly came back from [place] 6 d ago. Unknown trigger. ?HCTZ vs topical sensitizer. Roommate had a rash too.  F/ on HN. C/o obesity   Outpatient Medications Prior to Visit  Medication Sig Dispense Refill   ibuprofen (ADVIL) 800 MG tablet Take 800 mg by mouth every 8 (eight) hours as needed.     Multiple Vitamins-Minerals (MULTIVITAMIN WOMEN 50+ PO) Take 1 tablet by mouth daily.     telmisartan-hydrochlorothiazide (MICARDIS HCT) 40-12.5 MG tablet TAKE 1 TABLET BY MOUTH EVERY DAY 30 tablet 7   Cholecalciferol (VITAMIN D3) 25 MCG (1000 UT) CAPS Take 1 capsule (1,000 Units total) by mouth daily. (Patient not taking: Reported on 10/15/2021) 100 capsule 3   diclofenac Sodium (VOLTAREN) 1 % GEL Apply 1 application topically 4 (four) times daily. (Patient not taking: Reported on 10/15/2021) 100 g 3   diphenoxylate-atropine (LOMOTIL) 2.5-0.025 MG tablet Take 1 tablet by mouth 4 (four) times daily as needed for diarrhea or loose stools. (Patient not taking: Reported on 10/15/2021) 40 tablet 0   No facility-administered medications prior to visit.    ROS: Review of Systems  Constitutional:  Negative for activity change, appetite change, chills, fatigue and unexpected weight change.  HENT:  Negative for congestion, mouth sores and sinus pressure.   Eyes:  Negative for visual disturbance.  Respiratory:  Negative for cough and chest tightness.   Gastrointestinal:  Negative for abdominal pain and nausea.  Genitourinary:  Negative for difficulty urinating, frequency and vaginal pain.  Musculoskeletal:  Negative for back pain and gait problem.  Skin:  Positive for rash. Negative for pallor.  Neurological:  Negative for dizziness, tremors, weakness, numbness  and headaches.  Psychiatric/Behavioral:  Negative for confusion and sleep disturbance.     Objective:  BP 104/76 (BP Location: Left Arm, Patient Position: Sitting, Cuff Size: Large)   Pulse 82   Temp 98.1 F (36.7 C) (Oral)   Ht '5\' 4"'$  (1.626 m)   Wt 227 lb 6 oz (103.1 kg)   SpO2 97%   BMI 39.03 kg/m   BP Readings from Last 3 Encounters:  10/15/21 104/76  06/18/21 132/78  10/10/20 112/72    Wt Readings from Last 3 Encounters:  10/15/21 227 lb 6 oz (103.1 kg)  06/18/21 236 lb 3.2 oz (107.1 kg)  10/10/20 230 lb 9.6 oz (104.6 kg)    Physical Exam Constitutional:      General: She is not in acute distress.    Appearance: She is well-developed. She is obese.  HENT:     Head: Normocephalic.     Right Ear: External ear normal.     Left Ear: External ear normal.     Nose: Nose normal.  Eyes:     General:        Right eye: No discharge.        Left eye: No discharge.     Conjunctiva/sclera: Conjunctivae normal.     Pupils: Pupils are equal, round, and reactive to light.  Neck:     Thyroid: No thyromegaly.     Vascular: No JVD.     Trachea: No tracheal deviation.  Cardiovascular:     Rate and Rhythm: Normal rate and regular rhythm.  Heart sounds: Normal heart sounds.  Pulmonary:     Effort: No respiratory distress.     Breath sounds: No stridor. No wheezing.  Abdominal:     General: Bowel sounds are normal. There is no distension.     Palpations: Abdomen is soft. There is no mass.     Tenderness: There is no abdominal tenderness. There is no guarding or rebound.  Musculoskeletal:        General: No tenderness.     Cervical back: Normal range of motion and neck supple. No rigidity.  Lymphadenopathy:     Cervical: No cervical adenopathy.  Skin:    Findings: Rash present. No erythema.  Neurological:     Mental Status: She is oriented to person, place, and time.     Cranial Nerves: No cranial nerve deficit.     Motor: No abnormal muscle tone.     Coordination:  Coordination normal.     Deep Tendon Reflexes: Reflexes normal.  Psychiatric:        Behavior: Behavior normal.        Thought Content: Thought content normal.        Judgment: Judgment normal.   Eryth confluent papular rash on all exposed to sun areas  Lab Results  Component Value Date   WBC 4.3 01/24/2020   HGB 12.0 01/24/2020   HCT 37.4 01/24/2020   PLT 233.0 01/24/2020   GLUCOSE 83 01/24/2020   CHOL 221 (H) 01/24/2020   TRIG 233.0 (H) 01/24/2020   HDL 47.70 01/24/2020   LDLDIRECT 145.0 01/24/2020   LDLCALC 130 (H) 12/24/2011   ALT 23 01/24/2020   AST 20 01/24/2020   NA 139 01/24/2020   K 3.5 01/24/2020   CL 100 01/24/2020   CREATININE 1.07 01/24/2020   BUN 16 01/24/2020   CO2 32 01/24/2020   TSH 0.44 06/18/2021   HGBA1C 5.8 01/24/2020    US THYROID  Result Date: 06/19/2021 CLINICAL DATA:  56 year old female with a history thyroid nodules Prior biopsy of right-sided mid thyroid nodule 09/01/2019 Prior biopsy of left thyroid nodule 01/08/2012 Prior biopsy of left inferior thyroid nodule 03/20/2005 EXAM: THYROID ULTRASOUND TECHNIQUE: Ultrasound examination of the thyroid gland and adjacent soft tissues was performed. COMPARISON:  08/19/2019, biopsy 09/01/2019, 01/08/2012 FINDINGS: Parenchymal Echotexture: Markedly heterogenous Isthmus: 1.8 cm Right lobe: 6.8 cm x 3.2 cm x 3.0 cm Left lobe: 7.3 cm x 4.5 cm x 4.5 cm _________________________________________________________ Estimated total number of nodules >/= 1 cm: 6-10 Number of spongiform nodules >/=  2 cm not described below (TR1): 0 Number of mixed cystic and solid nodules >/= 1.5 cm not described below ([place]): 0 _________________________________________________________ Nodule labeled 1, inferior right thyroid, 2.5 cm, previously 2.8 cm. This nodule has been previously biopsied, 09/01/2019. Assuming benign result, no further specific follow-up would be indicated. The remainder of the thyroid tissue is unchanged from the  comparison ultrasound of 08/19/2019, with heterogeneous multinodular echotexture. The additional measured nodules on today's study, not previously measured appear similar and are favored to represent pseudo nodules in the setting of thyroid goiter. No adenopathy Recommendations follow those established by the new ACR TI-RADS criteria (J Am Coll Radiol 2017;14:587-595). IMPRESSION: Decreasing size of the right inferior thyroid nodule, previously biopsied. Assuming benign result, no further specific follow-up would be indicated. Similar appearance enlarged multinodular thyroid goiter. The additionally measured nodules/tissue on today's study has similar appearance to the comparison ultrasound of 08/19/2019, and are favored to represent pseudo nodules. Electronically Signed   By: York Cerise  Earleen Newport D.O.   On: 06/19/2021 15:15    Assessment & Plan:   Problem List Items Addressed This Visit     Hyperglycemia    Check A1c      Relevant Orders   Hemoglobin A1c   Hypertension    Cont on Micardis HCT      Obesity (BMI 35.0-39.9 without comorbidity)    Will discuss when the rash is gone On diet      Solar dermatitis - Primary    Zamantha came back from [place] 6 d ago. Unknown trigger. ?HCTZ vs topical sensitizer. Roommate had a rash too Depo- Medrol IM - declined Medrol pack Triamc cream Claritin po      Well adult exam   Relevant Orders   TSH   Urinalysis   CBC with Differential/Platelet   Lipid panel   Comprehensive metabolic panel      Meds ordered this encounter  Medications   triamcinolone cream (KENALOG) 0.1 %    Sig: Apply 1 Application topically 4 (four) times daily.    Dispense:  450 g    Refill:  1   methylPREDNISolone (MEDROL DOSEPAK) 4 MG TBPK tablet    Sig: As directed    Dispense:  21 tablet    Refill:  0      Follow-up: Return in about 6 weeks (around 11/26/2021) for Wellness Exam.  Walker Kehr, MD

## 2021-10-15 NOTE — Assessment & Plan Note (Addendum)
Jenaya came back from [place] 6 d ago. Unknown trigger. ?HCTZ vs topical sensitizer. Roommate had a rash too Depo- Medrol IM - declined Medrol pack Triamc cream Claritin po

## 2021-10-15 NOTE — Assessment & Plan Note (Signed)
Will discuss when the rash is gone On diet

## 2021-10-15 NOTE — Assessment & Plan Note (Signed)
Cont on Micardis HCT

## 2021-10-15 NOTE — Assessment & Plan Note (Signed)
Check A1c. 

## 2021-10-30 ENCOUNTER — Ambulatory Visit: Payer: 59 | Admitting: Internal Medicine

## 2022-01-06 ENCOUNTER — Encounter: Payer: Self-pay | Admitting: Internal Medicine

## 2022-01-07 ENCOUNTER — Other Ambulatory Visit: Payer: Self-pay | Admitting: Internal Medicine

## 2022-01-07 MED ORDER — NITROFURANTOIN MONOHYD MACRO 100 MG PO CAPS: 100.0000 mg | ORAL_CAPSULE | Freq: Two times a day (BID) | ORAL | 0 refills | Status: AC

## 2022-04-04 ENCOUNTER — Other Ambulatory Visit: Payer: Self-pay | Admitting: Internal Medicine

## 2022-04-29 ENCOUNTER — Ambulatory Visit (INDEPENDENT_AMBULATORY_CARE_PROVIDER_SITE_OTHER): Payer: 59 | Admitting: Internal Medicine

## 2022-04-29 ENCOUNTER — Encounter: Payer: Self-pay | Admitting: Internal Medicine

## 2022-04-29 VITALS — BP 122/72 | HR 70 | Temp 98.4°F | Ht 64.0 in | Wt 236.0 lb

## 2022-04-29 DIAGNOSIS — Z0000 Encounter for general adult medical examination without abnormal findings: Secondary | ICD-10-CM | POA: Diagnosis not present

## 2022-04-29 DIAGNOSIS — R739 Hyperglycemia, unspecified: Secondary | ICD-10-CM | POA: Diagnosis not present

## 2022-04-29 DIAGNOSIS — L732 Hidradenitis suppurativa: Secondary | ICD-10-CM

## 2022-04-29 DIAGNOSIS — E669 Obesity, unspecified: Secondary | ICD-10-CM | POA: Diagnosis not present

## 2022-04-29 DIAGNOSIS — E049 Nontoxic goiter, unspecified: Secondary | ICD-10-CM | POA: Diagnosis not present

## 2022-04-29 LAB — LIPID PANEL
Cholesterol: 220 mg/dL — ABNORMAL HIGH (ref 0–200)
HDL: 55.5 mg/dL (ref >39.00)
LDL Cholesterol: 131 mg/dL — ABNORMAL HIGH (ref 0–99)
NonHDL: 164.96
Total CHOL/HDL Ratio: 4
Triglycerides: 172 mg/dL — ABNORMAL HIGH (ref 0.0–149.0)
VLDL: 34.4 mg/dL (ref 0.0–40.0)

## 2022-04-29 LAB — CBC WITH DIFFERENTIAL/PLATELET
Basophils Absolute: 0 10*3/uL (ref 0.0–0.1)
Basophils Relative: 0.8 % (ref 0.0–3.0)
Eosinophils Absolute: 0.2 10*3/uL (ref 0.0–0.7)
Eosinophils Relative: 3.6 % (ref 0.0–5.0)
HCT: 36.5 % (ref 36.0–46.0)
Hemoglobin: 11.7 g/dL — ABNORMAL LOW (ref 12.0–15.0)
Lymphocytes Relative: 42.4 % (ref 12.0–46.0)
Lymphs Abs: 1.9 10*3/uL (ref 0.7–4.0)
MCHC: 32.1 g/dL (ref 30.0–36.0)
MCV: 85.6 fl (ref 78.0–100.0)
Monocytes Absolute: 0.5 10*3/uL (ref 0.1–1.0)
Monocytes Relative: 10.3 % (ref 3.0–12.0)
Neutro Abs: 1.9 10*3/uL (ref 1.4–7.7)
Neutrophils Relative %: 42.9 % — ABNORMAL LOW (ref 43.0–77.0)
Platelets: 250 10*3/uL (ref 150.0–400.0)
RBC: 4.26 Mil/uL (ref 3.87–5.11)
RDW: 14.2 % (ref 11.5–15.5)
WBC: 4.5 10*3/uL (ref 4.0–10.5)

## 2022-04-29 LAB — TSH: TSH: 0.66 u[IU]/mL (ref 0.35–5.50)

## 2022-04-29 LAB — URINALYSIS
Bilirubin Urine: NEGATIVE
Hgb urine dipstick: NEGATIVE
Ketones, ur: NEGATIVE
Leukocytes,Ua: NEGATIVE
Nitrite: NEGATIVE
Specific Gravity, Urine: 1.02 (ref 1.000–1.030)
Total Protein, Urine: NEGATIVE
Urine Glucose: NEGATIVE
Urobilinogen, UA: 0.2 (ref 0.0–1.0)
pH: 6 (ref 5.0–8.0)

## 2022-04-29 LAB — COMPREHENSIVE METABOLIC PANEL
ALT: 21 U/L (ref 0–35)
AST: 19 U/L (ref 0–37)
Albumin: 4.1 g/dL (ref 3.5–5.2)
Alkaline Phosphatase: 69 U/L (ref 39–117)
BUN: 19 mg/dL (ref 6–23)
CO2: 30 mEq/L (ref 19–32)
Calcium: 9.5 mg/dL (ref 8.4–10.5)
Chloride: 103 mEq/L (ref 96–112)
Creatinine, Ser: 0.93 mg/dL (ref 0.40–1.20)
GFR: 68.54 mL/min (ref >60.00)
Glucose, Bld: 86 mg/dL (ref 70–99)
Potassium: 4.1 mEq/L (ref 3.5–5.1)
Sodium: 140 mEq/L (ref 135–145)
Total Bilirubin: 0.3 mg/dL (ref 0.2–1.2)
Total Protein: 7.5 g/dL (ref 6.0–8.3)

## 2022-04-29 LAB — HEMOGLOBIN A1C: Hgb A1c MFr Bld: 5.7 % (ref 4.6–6.5)

## 2022-04-29 MED ORDER — DOXYCYCLINE HYCLATE 100 MG PO TABS: ORAL_TABLET | ORAL | 1 refills | Status: DC

## 2022-04-29 NOTE — Assessment & Plan Note (Signed)
On diet Wt Readings from Last 3 Encounters:  04/29/22 236 lb (107 kg)  10/15/21 227 lb 6 oz (103.1 kg)  06/18/21 236 lb 3.2 oz (107.1 kg)

## 2022-04-29 NOTE — Assessment & Plan Note (Signed)
Check A1c 

## 2022-04-29 NOTE — Assessment & Plan Note (Signed)
Recent US was reviewed

## 2022-04-29 NOTE — Progress Notes (Signed)
Subjective:  Patient ID: Amber Reilly, female    DOB: 1965-11-04  Age: 57 y.o. MRN: [id number]  CC: Cyst (Has bursted )   HPI Amber Reilly presents for boils under L arm - pus came out  F/u on goiter, obesity  Outpatient Medications Prior to Visit  Medication Sig Dispense Refill   ibuprofen (ADVIL) 800 MG tablet Take 800 mg by mouth every 8 (eight) hours as needed.     methylPREDNISolone (MEDROL DOSEPAK) 4 MG TBPK tablet As directed 21 tablet 0   Multiple Vitamins-Minerals (MULTIVITAMIN WOMEN 50+ PO) Take 1 tablet by mouth daily.     telmisartan-hydrochlorothiazide (MICARDIS HCT) 40-12.5 MG tablet TAKE 1 TABLET BY MOUTH EVERY DAY 30 tablet 5   triamcinolone cream (KENALOG) 0.1 % Apply 1 Application topically 4 (four) times daily. 30 g 0   Cholecalciferol (VITAMIN D3) 25 MCG (1000 UT) CAPS Take 1 capsule (1,000 Units total) by mouth daily. (Patient not taking: Reported on 10/15/2021) 100 capsule 3   diclofenac Sodium (VOLTAREN) 1 % GEL Apply 1 application topically 4 (four) times daily. (Patient not taking: Reported on 10/15/2021) 100 g 3   diphenoxylate-atropine (LOMOTIL) 2.5-0.025 MG tablet Take 1 tablet by mouth 4 (four) times daily as needed for diarrhea or loose stools. (Patient not taking: Reported on 10/15/2021) 40 tablet 0   No facility-administered medications prior to visit.    ROS: Review of Systems  Constitutional:  Negative for activity change, appetite change, chills, fatigue and unexpected weight change.  HENT:  Negative for congestion, mouth sores and sinus pressure.   Eyes:  Negative for visual disturbance.  Respiratory:  Negative for cough and chest tightness.   Gastrointestinal:  Negative for abdominal pain and nausea.  Genitourinary:  Negative for difficulty urinating, frequency and vaginal pain.  Musculoskeletal:  Positive for arthralgias and gait problem. Negative for back pain.  Skin:  Positive for color change. Negative for pallor and rash.   Neurological:  Negative for dizziness, tremors, weakness, numbness and headaches.  Psychiatric/Behavioral:  Negative for confusion and sleep disturbance.     Objective:  BP 122/72 (BP Location: Right Arm, Patient Position: Sitting, Cuff Size: Normal)   Pulse 70   Temp 98.4 F (36.9 C) (Oral)   Ht '5\' 4"'$  (1.626 m)   Wt 236 lb (107 kg)   SpO2 97%   BMI 40.51 kg/m   BP Readings from Last 3 Encounters:  04/29/22 122/72  10/15/21 104/76  06/18/21 132/78    Wt Readings from Last 3 Encounters:  04/29/22 236 lb (107 kg)  10/15/21 227 lb 6 oz (103.1 kg)  06/18/21 236 lb 3.2 oz (107.1 kg)    Physical Exam Constitutional:      General: She is not in acute distress.    Appearance: She is well-developed. She is obese.  HENT:     Head: Normocephalic.     Right Ear: External ear normal.     Left Ear: External ear normal.     Nose: Nose normal.  Eyes:     General:        Right eye: No discharge.        Left eye: No discharge.     Conjunctiva/sclera: Conjunctivae normal.     Pupils: Pupils are equal, round, and reactive to light.  Neck:     Thyroid: No thyromegaly.     Vascular: No JVD.     Trachea: No tracheal deviation.  Cardiovascular:     Rate and Rhythm: Normal rate  and regular rhythm.     Heart sounds: Normal heart sounds.  Pulmonary:     Effort: No respiratory distress.     Breath sounds: No stridor. No wheezing.  Abdominal:     General: Bowel sounds are normal. There is no distension.     Palpations: Abdomen is soft. There is no mass.     Tenderness: There is no abdominal tenderness. There is no guarding or rebound.  Musculoskeletal:        General: No tenderness.     Cervical back: Normal range of motion and neck supple. No rigidity.  Lymphadenopathy:     Cervical: No cervical adenopathy.  Skin:    Findings: No erythema or rash.  Neurological:     Cranial Nerves: No cranial nerve deficit.     Motor: No abnormal muscle tone.     Coordination: Coordination  normal.     Deep Tendon Reflexes: Reflexes normal.  Psychiatric:        Behavior: Behavior normal.        Thought Content: Thought content normal.        Judgment: Judgment normal.   Hydradenitis L x 2 lesions <1 cm  Lab Results  Component Value Date   WBC 4.3 01/24/2020   HGB 12.0 01/24/2020   HCT 37.4 01/24/2020   PLT 233.0 01/24/2020   GLUCOSE 83 01/24/2020   CHOL 221 (H) 01/24/2020   TRIG 233.0 (H) 01/24/2020   HDL 47.70 01/24/2020   LDLDIRECT 145.0 01/24/2020   LDLCALC 130 (H) 12/24/2011   ALT 23 01/24/2020   AST 20 01/24/2020   NA 139 01/24/2020   K 3.5 01/24/2020   CL 100 01/24/2020   CREATININE 1.07 01/24/2020   BUN 16 01/24/2020   CO2 32 01/24/2020   TSH 0.44 06/18/2021   HGBA1C 5.8 01/24/2020    US THYROID  Result Date: 06/19/2021 CLINICAL DATA:  57 year old female with a history thyroid nodules Prior biopsy of right-sided mid thyroid nodule 09/01/2019 Prior biopsy of left thyroid nodule 01/08/2012 Prior biopsy of left inferior thyroid nodule 03/20/2005 EXAM: THYROID ULTRASOUND TECHNIQUE: Ultrasound examination of the thyroid gland and adjacent soft tissues was performed. COMPARISON:  08/19/2019, biopsy 09/01/2019, 01/08/2012 FINDINGS: Parenchymal Echotexture: Markedly heterogenous Isthmus: 1.8 cm Right lobe: 6.8 cm x 3.2 cm x 3.0 cm Left lobe: 7.3 cm x 4.5 cm x 4.5 cm _________________________________________________________ Estimated total number of nodules >/= 1 cm: 6-10 Number of spongiform nodules >/=  2 cm not described below (TR1): 0 Number of mixed cystic and solid nodules >/= 1.5 cm not described below ([place]): 0 _________________________________________________________ Nodule labeled 1, inferior right thyroid, 2.5 cm, previously 2.8 cm. This nodule has been previously biopsied, 09/01/2019. Assuming benign result, no further specific follow-up would be indicated. The remainder of the thyroid tissue is unchanged from the comparison ultrasound of 08/19/2019, with  heterogeneous multinodular echotexture. The additional measured nodules on today's study, not previously measured appear similar and are favored to represent pseudo nodules in the setting of thyroid goiter. No adenopathy Recommendations follow those established by the new ACR TI-RADS criteria (J Am Coll Radiol 2017;14:587-595). IMPRESSION: Decreasing size of the right inferior thyroid nodule, previously biopsied. Assuming benign result, no further specific follow-up would be indicated. Similar appearance enlarged multinodular thyroid goiter. The additionally measured nodules/tissue on today's study has similar appearance to the comparison ultrasound of 08/19/2019, and are favored to represent pseudo nodules. Electronically Signed   By: Corrie Mckusick D.O.   On: 06/19/2021 15:15  Assessment & Plan:   Problem List Items Addressed This Visit       Endocrine   Goiter    Recent US was reviewed        Musculoskeletal and Integument   Hydradenitis    Recurrent  Start Doxy po prn        Other   Hyperglycemia - Primary    Check A1c      Obesity (BMI 35.0-39.9 without comorbidity)    On diet Wt Readings from Last 3 Encounters:  04/29/22 236 lb (107 kg)  10/15/21 227 lb 6 oz (103.1 kg)  06/18/21 236 lb 3.2 oz (107.1 kg)           No orders of the defined types were placed in this encounter.     Follow-up: Return in about 6 months (around 10/28/2022) for Wellness Exam.  Walker Kehr, MD

## 2022-04-29 NOTE — Assessment & Plan Note (Signed)
Recurrent  Start Doxy po prn

## 2022-04-29 NOTE — Patient Instructions (Signed)
BoilEase cream

## 2022-09-02 ENCOUNTER — Encounter: Payer: Self-pay | Admitting: Internal Medicine

## 2022-10-18 ENCOUNTER — Other Ambulatory Visit: Payer: Self-pay | Admitting: Internal Medicine

## 2022-11-06 ENCOUNTER — Ambulatory Visit: Payer: 59 | Admitting: Internal Medicine

## 2022-11-11 ENCOUNTER — Other Ambulatory Visit: Payer: Self-pay | Admitting: Internal Medicine

## 2022-11-18 ENCOUNTER — Encounter: Payer: Self-pay | Admitting: Internal Medicine

## 2022-11-18 ENCOUNTER — Ambulatory Visit (INDEPENDENT_AMBULATORY_CARE_PROVIDER_SITE_OTHER): Payer: 59 | Admitting: Internal Medicine

## 2022-11-18 VITALS — BP 110/78 | HR 77 | Temp 98.6°F | Ht 64.0 in | Wt 235.0 lb

## 2022-11-18 DIAGNOSIS — Z0000 Encounter for general adult medical examination without abnormal findings: Secondary | ICD-10-CM

## 2022-11-18 DIAGNOSIS — I10 Essential (primary) hypertension: Secondary | ICD-10-CM

## 2022-11-18 MED ORDER — TELMISARTAN-HCTZ 40-12.5 MG PO TABS: 1.0000 | ORAL_TABLET | Freq: Every day | ORAL | 3 refills | Status: DC

## 2022-11-18 NOTE — Progress Notes (Signed)
Subjective:  Patient ID: Amber Reilly, female    DOB: [birth year]  Age: 57 y.o. MRN: [id number]  CC: Annual Exam   HPI Amber Reilly presents for HTN  Outpatient Medications Prior to Visit  Medication Sig Dispense Refill   ibuprofen (ADVIL) 800 MG tablet Take 800 mg by mouth every 8 (eight) hours as needed.     Multiple Vitamins-Minerals (MULTIVITAMIN WOMEN 50+ PO) Take 1 tablet by mouth daily.     triamcinolone cream (KENALOG) 0.1 % APPLY 1 APPLICATION TOPICALLY 4 TIMES A DAY 30 g 0   telmisartan-hydrochlorothiazide (MICARDIS HCT) 40-12.5 MG tablet Take 1 tablet by mouth daily. Annual appt is due must see provider for future refills 30 tablet 0   Cholecalciferol (VITAMIN D3) 25 MCG (1000 UT) CAPS Take 1 capsule (1,000 Units total) by mouth daily. (Patient not taking: Reported on 10/15/2021) 100 capsule 3   diclofenac Sodium (VOLTAREN) 1 % GEL Apply 1 application topically 4 (four) times daily. (Patient not taking: Reported on 10/15/2021) 100 g 3   diphenoxylate-atropine (LOMOTIL) 2.5-0.025 MG tablet Take 1 tablet by mouth 4 (four) times daily as needed for diarrhea or loose stools. (Patient not taking: Reported on 10/15/2021) 40 tablet 0   doxycycline (VIBRA-TABS) 100 MG tablet 100 mg po bid x 10 days prn boils 80 tablet 1   methylPREDNISolone (MEDROL DOSEPAK) 4 MG TBPK tablet As directed 21 tablet 0   No facility-administered medications prior to visit.    ROS: Review of Systems  Constitutional:  Negative for activity change, appetite change, chills, fatigue and unexpected weight change.  HENT:  Negative for congestion, mouth sores and sinus pressure.   Eyes:  Negative for visual disturbance.  Respiratory:  Negative for cough and chest tightness.   Gastrointestinal:  Negative for abdominal pain and nausea.  Genitourinary:  Negative for difficulty urinating, frequency and vaginal pain.  Musculoskeletal:  Negative for back pain and gait problem.  Skin:  Negative for pallor and  rash.  Neurological:  Negative for dizziness, tremors, weakness, numbness and headaches.  Psychiatric/Behavioral:  Negative for confusion and sleep disturbance.     Objective:  BP 110/78 (BP Location: Right Arm, Patient Position: Sitting, Cuff Size: Large)   Pulse 77   Temp 98.6 F (37 C) (Oral)   Ht 5\' 4"  (1.626 m)   Wt 235 lb (106.6 kg)   SpO2 98%   BMI 40.34 kg/m   BP Readings from Last 3 Encounters:  11/18/22 110/78  04/29/22 122/72  10/15/21 104/76    Wt Readings from Last 3 Encounters:  11/18/22 235 lb (106.6 kg)  04/29/22 236 lb (107 kg)  10/15/21 227 lb 6 oz (103.1 kg)    Physical Exam Constitutional:      General: She is not in acute distress.    Appearance: She is well-developed. She is obese.  HENT:     Head: Normocephalic.     Right Ear: External ear normal.     Left Ear: External ear normal.     Nose: Nose normal.  Eyes:     General:        Right eye: No discharge.        Left eye: No discharge.     Conjunctiva/sclera: Conjunctivae normal.     Pupils: Pupils are equal, round, and reactive to light.  Neck:     Thyroid: No thyromegaly.     Vascular: No JVD.     Trachea: No tracheal deviation.  Cardiovascular:  Rate and Rhythm: Normal rate and regular rhythm.     Heart sounds: Normal heart sounds.  Pulmonary:     Effort: No respiratory distress.     Breath sounds: No stridor. No wheezing.  Abdominal:     General: Bowel sounds are normal. There is no distension.     Palpations: Abdomen is soft. There is no mass.     Tenderness: There is no abdominal tenderness. There is no guarding or rebound.  Musculoskeletal:        General: No tenderness.     Cervical back: Normal range of motion and neck supple. No rigidity.  Lymphadenopathy:     Cervical: No cervical adenopathy.  Skin:    Findings: No erythema or rash.  Neurological:     Cranial Nerves: No cranial nerve deficit.     Motor: No abnormal muscle tone.     Coordination: Coordination  normal.     Deep Tendon Reflexes: Reflexes normal.  Psychiatric:        Behavior: Behavior normal.        Thought Content: Thought content normal.        Judgment: Judgment normal.     Lab Results  Component Value Date   WBC 4.5 04/29/2022   HGB 11.7 (L) 04/29/2022   HCT 36.5 04/29/2022   PLT 250.0 04/29/2022   GLUCOSE 86 04/29/2022   CHOL 220 (H) 04/29/2022   TRIG 172.0 (H) 04/29/2022   HDL 55.50 04/29/2022   LDLDIRECT 145.0 01/24/2020   LDLCALC 131 (H) 04/29/2022   ALT 21 04/29/2022   AST 19 04/29/2022   NA 140 04/29/2022   K 4.1 04/29/2022   CL 103 04/29/2022   CREATININE 0.93 04/29/2022   BUN 19 04/29/2022   CO2 30 04/29/2022   TSH 0.66 04/29/2022   HGBA1C 5.7 04/29/2022    US THYROID  Result Date: 06/19/2021 CLINICAL DATA:  57 year old female with a history thyroid nodules Prior biopsy of right-sided mid thyroid nodule 09/01/2019 Prior biopsy of left thyroid nodule 01/08/2012 Prior biopsy of left inferior thyroid nodule 03/20/2005 EXAM: THYROID ULTRASOUND TECHNIQUE: Ultrasound examination of the thyroid gland and adjacent soft tissues was performed. COMPARISON:  08/19/2019, biopsy 09/01/2019, 01/08/2012 FINDINGS: Parenchymal Echotexture: Markedly heterogenous Isthmus: 1.8 cm Right lobe: 6.8 cm x 3.2 cm x 3.0 cm Left lobe: 7.3 cm x 4.5 cm x 4.5 cm _________________________________________________________ Estimated total number of nodules >/= 1 cm: 6-10 Number of spongiform nodules >/=  2 cm not described below (TR1): 0 Number of mixed cystic and solid nodules >/= 1.5 cm not described below (TR2): 0 _________________________________________________________ Nodule labeled 1, inferior right thyroid, 2.5 cm, previously 2.8 cm. This nodule has been previously biopsied, 09/01/2019. Assuming benign result, no further specific follow-up would be indicated. The remainder of the thyroid tissue is unchanged from the comparison ultrasound of 08/19/2019, with heterogeneous multinodular  echotexture. The additional measured nodules on today's study, not previously measured appear similar and are favored to represent pseudo nodules in the setting of thyroid goiter. No adenopathy Recommendations follow those established by the new ACR TI-RADS criteria (J Am Coll Radiol 2017;14:587-595). IMPRESSION: Decreasing size of the right inferior thyroid nodule, previously biopsied. Assuming benign result, no further specific follow-up would be indicated. Similar appearance enlarged multinodular thyroid goiter. The additionally measured nodules/tissue on today's study has similar appearance to the comparison ultrasound of 08/19/2019, and are favored to represent pseudo nodules. Electronically Signed   By: Gilmer Mor D.O.   On: 06/19/2021 15:15  Assessment & Plan:   Problem List Items Addressed This Visit     Well adult exam   Relevant Orders   TSH   Urinalysis   CBC with Differential/Platelet   Lipid panel   Comprehensive metabolic panel   Hypertension - Primary    Cont on Micardis HCT      Relevant Medications   telmisartan-hydrochlorothiazide (MICARDIS HCT) 40-12.5 MG tablet      Meds ordered this encounter  Medications   telmisartan-hydrochlorothiazide (MICARDIS HCT) 40-12.5 MG tablet    Sig: Take 1 tablet by mouth daily. Annual appt is due must see provider for future refills    Dispense:  90 tablet    Refill:  3      Follow-up: Return in about 6 months (around 05/21/2023) for Wellness Exam.  Sonda Primes, MD

## 2022-11-18 NOTE — Assessment & Plan Note (Signed)
Cont on Micardis HCT

## 2023-04-27 ENCOUNTER — Other Ambulatory Visit: Payer: Self-pay | Admitting: Internal Medicine

## 2023-04-27 DIAGNOSIS — Z1231 Encounter for screening mammogram for malignant neoplasm of breast: Secondary | ICD-10-CM

## 2023-04-29 ENCOUNTER — Ambulatory Visit: Payer: No Typology Code available for payment source

## 2023-04-29 DIAGNOSIS — Z1231 Encounter for screening mammogram for malignant neoplasm of breast: Secondary | ICD-10-CM

## 2023-05-07 ENCOUNTER — Ambulatory Visit: Payer: 59 | Admitting: Internal Medicine

## 2023-05-28 ENCOUNTER — Ambulatory Visit (INDEPENDENT_AMBULATORY_CARE_PROVIDER_SITE_OTHER): Payer: 59 | Admitting: Internal Medicine

## 2023-05-28 ENCOUNTER — Encounter: Payer: Self-pay | Admitting: Internal Medicine

## 2023-05-28 VITALS — BP 132/80 | HR 78 | Temp 97.9°F | Wt 250.0 lb

## 2023-05-28 DIAGNOSIS — Z0000 Encounter for general adult medical examination without abnormal findings: Secondary | ICD-10-CM

## 2023-05-28 DIAGNOSIS — E669 Obesity, unspecified: Secondary | ICD-10-CM | POA: Diagnosis not present

## 2023-05-28 DIAGNOSIS — R635 Abnormal weight gain: Secondary | ICD-10-CM

## 2023-05-28 DIAGNOSIS — E042 Nontoxic multinodular goiter: Secondary | ICD-10-CM | POA: Diagnosis not present

## 2023-05-28 LAB — CBC WITH DIFFERENTIAL/PLATELET
Basophils Absolute: 0 10*3/uL (ref 0.0–0.1)
Basophils Relative: 0.5 % (ref 0.0–3.0)
Eosinophils Absolute: 0.2 10*3/uL (ref 0.0–0.7)
Eosinophils Relative: 3.7 % (ref 0.0–5.0)
HCT: 38.1 % (ref 36.0–46.0)
Hemoglobin: 11.9 g/dL — ABNORMAL LOW (ref 12.0–15.0)
Lymphocytes Relative: 45 % (ref 12.0–46.0)
Lymphs Abs: 1.9 10*3/uL (ref 0.7–4.0)
MCHC: 31.3 g/dL (ref 30.0–36.0)
MCV: 87.3 fl (ref 78.0–100.0)
Monocytes Absolute: 0.4 10*3/uL (ref 0.1–1.0)
Monocytes Relative: 9.3 % (ref 3.0–12.0)
Neutro Abs: 1.7 10*3/uL (ref 1.4–7.7)
Neutrophils Relative %: 41.5 % — ABNORMAL LOW (ref 43.0–77.0)
Platelets: 248 10*3/uL (ref 150.0–400.0)
RBC: 4.36 Mil/uL (ref 3.87–5.11)
RDW: 15.5 % (ref 11.5–15.5)
WBC: 4.2 10*3/uL (ref 4.0–10.5)

## 2023-05-28 LAB — VITAMIN B12: Vitamin B-12: 1090 pg/mL — ABNORMAL HIGH (ref 211–911)

## 2023-05-28 LAB — CORTISOL: Cortisol, Plasma: 4.6 ug/dL

## 2023-05-28 LAB — URINALYSIS
Bilirubin Urine: NEGATIVE
Hgb urine dipstick: NEGATIVE
Ketones, ur: NEGATIVE
Leukocytes,Ua: NEGATIVE
Nitrite: NEGATIVE
Specific Gravity, Urine: 1.02 (ref 1.000–1.030)
Total Protein, Urine: NEGATIVE
Urine Glucose: NEGATIVE
Urobilinogen, UA: 0.2 (ref 0.0–1.0)
pH: 6 (ref 5.0–8.0)

## 2023-05-28 LAB — COMPREHENSIVE METABOLIC PANEL
ALT: 25 U/L (ref 0–35)
AST: 21 U/L (ref 0–37)
Albumin: 4.3 g/dL (ref 3.5–5.2)
Alkaline Phosphatase: 62 U/L (ref 39–117)
BUN: 21 mg/dL (ref 6–23)
CO2: 32 meq/L (ref 19–32)
Calcium: 9.7 mg/dL (ref 8.4–10.5)
Chloride: 100 meq/L (ref 96–112)
Creatinine, Ser: 1 mg/dL (ref 0.40–1.20)
GFR: 62.35 mL/min (ref >60.00)
Glucose, Bld: 93 mg/dL (ref 70–99)
Potassium: 4.1 meq/L (ref 3.5–5.1)
Sodium: 139 meq/L (ref 135–145)
Total Bilirubin: 0.4 mg/dL (ref 0.2–1.2)
Total Protein: 8.2 g/dL (ref 6.0–8.3)

## 2023-05-28 LAB — T4, FREE: Free T4: 0.91 ng/dL (ref 0.60–1.60)

## 2023-05-28 LAB — HEMOGLOBIN A1C: Hgb A1c MFr Bld: 5.8 % (ref 4.6–6.5)

## 2023-05-28 LAB — VITAMIN D 25 HYDROXY (VIT D DEFICIENCY, FRACTURES): VITD: 34.69 ng/mL (ref 30.00–100.00)

## 2023-05-28 LAB — TSH: TSH: 0.46 u[IU]/mL (ref 0.35–5.50)

## 2023-05-28 MED ORDER — PHENTERMINE HCL 37.5 MG PO TABS: 37.5000 mg | ORAL_TABLET | Freq: Every day | ORAL | 2 refills | Status: DC

## 2023-05-28 NOTE — Assessment & Plan Note (Signed)
 Growing slowly; some DOE is present ENT ref

## 2023-05-28 NOTE — Assessment & Plan Note (Signed)
 Worse BMI 42 Options discussed

## 2023-05-28 NOTE — Progress Notes (Signed)
 Subjective:  Patient ID: Amber Reilly, female    DOB: 1965-06-29  Age: 58 y.o. MRN: [id number]  CC: Obesity (Wants to discuss weight loss options)   HPI Amber Reilly presents for a well exam C/o wt gain  Outpatient Medications Prior to Visit  Medication Sig Dispense Refill   Cholecalciferol (VITAMIN D3) 25 MCG (1000 UT) CAPS Take 1 capsule (1,000 Units total) by mouth daily. 100 capsule 3   Multiple Vitamins-Minerals (MULTIVITAMIN WOMEN 50+ PO) Take 1 tablet by mouth daily.     telmisartan-hydrochlorothiazide (MICARDIS HCT) 40-12.5 MG tablet Take 1 tablet by mouth daily. Annual appt is due must see provider for future refills 90 tablet 3   triamcinolone cream (KENALOG) 0.1 % APPLY 1 APPLICATION TOPICALLY 4 TIMES A DAY 30 g 0   ibuprofen (ADVIL) 800 MG tablet Take 800 mg by mouth every 8 (eight) hours as needed.     No facility-administered medications prior to visit.    ROS: Review of Systems  Constitutional:  Negative for activity change, appetite change, chills, fatigue and unexpected weight change.  HENT:  Negative for congestion, mouth sores and sinus pressure.   Eyes:  Negative for visual disturbance.  Respiratory:  Negative for cough and chest tightness.   Gastrointestinal:  Negative for abdominal pain and nausea.  Genitourinary:  Negative for difficulty urinating, frequency and vaginal pain.  Musculoskeletal:  Positive for gait problem. Negative for back pain.  Skin:  Negative for pallor and rash.  Neurological:  Negative for dizziness, tremors, weakness, numbness and headaches.  Psychiatric/Behavioral:  Negative for confusion and sleep disturbance. The patient is not nervous/anxious.     Objective:  BP 132/80 (BP Location: Left Arm, Patient Position: Sitting)   Pulse 78   Temp 97.9 F (36.6 C) (Temporal)   Wt 250 lb (113.4 kg)   SpO2 98%   BMI 42.91 kg/m   BP Readings from Last 3 Encounters:  05/28/23 132/80  11/18/22 110/78  04/29/22 122/72    Wt  Readings from Last 3 Encounters:  05/28/23 250 lb (113.4 kg)  11/18/22 235 lb (106.6 kg)  04/29/22 236 lb (107 kg)    Physical Exam Constitutional:      General: She is not in acute distress.    Appearance: She is well-developed. She is obese.  HENT:     Head: Normocephalic.     Right Ear: External ear normal.     Left Ear: External ear normal.     Nose: Nose normal.  Eyes:     General:        Right eye: No discharge.        Left eye: No discharge.     Conjunctiva/sclera: Conjunctivae normal.     Pupils: Pupils are equal, round, and reactive to light.  Neck:     Thyroid: No thyromegaly.     Vascular: No JVD.     Trachea: No tracheal deviation.  Cardiovascular:     Rate and Rhythm: Normal rate and regular rhythm.     Heart sounds: Normal heart sounds.  Pulmonary:     Effort: No respiratory distress.     Breath sounds: No stridor. No wheezing.  Abdominal:     General: Bowel sounds are normal. There is no distension.     Palpations: Abdomen is soft. There is no mass.     Tenderness: There is no abdominal tenderness. There is no guarding or rebound.  Musculoskeletal:        General: No tenderness.  Cervical back: Normal range of motion and neck supple. No rigidity.  Lymphadenopathy:     Cervical: No cervical adenopathy.  Skin:    Findings: No erythema or rash.  Neurological:     Mental Status: She is oriented to person, place, and time.     Cranial Nerves: No cranial nerve deficit.     Motor: No abnormal muscle tone.     Coordination: Coordination normal.     Gait: Gait normal.     Deep Tendon Reflexes: Reflexes normal.  Psychiatric:        Behavior: Behavior normal.        Thought Content: Thought content normal.        Judgment: Judgment normal.     Lab Results  Component Value Date   WBC 4.5 04/29/2022   HGB 11.7 (L) 04/29/2022   HCT 36.5 04/29/2022   PLT 250.0 04/29/2022   GLUCOSE 86 04/29/2022   CHOL 220 (H) 04/29/2022   TRIG 172.0 (H) 04/29/2022    HDL 55.50 04/29/2022   LDLDIRECT 145.0 01/24/2020   LDLCALC 131 (H) 04/29/2022   ALT 21 04/29/2022   AST 19 04/29/2022   NA 140 04/29/2022   K 4.1 04/29/2022   CL 103 04/29/2022   CREATININE 0.93 04/29/2022   BUN 19 04/29/2022   CO2 30 04/29/2022   TSH 0.66 04/29/2022   HGBA1C 5.7 04/29/2022    US THYROID Result Date: 06/19/2021 CLINICAL DATA:  58 year old female with a history thyroid nodules Prior biopsy of right-sided mid thyroid nodule 09/01/2019 Prior biopsy of left thyroid nodule 01/08/2012 Prior biopsy of left inferior thyroid nodule 03/20/2005 EXAM: THYROID ULTRASOUND TECHNIQUE: Ultrasound examination of the thyroid gland and adjacent soft tissues was performed. COMPARISON:  08/19/2019, biopsy 09/01/2019, 01/08/2012 FINDINGS: Parenchymal Echotexture: Markedly heterogenous Isthmus: 1.8 cm Right lobe: 6.8 cm x 3.2 cm x 3.0 cm Left lobe: 7.3 cm x 4.5 cm x 4.5 cm _________________________________________________________ Estimated total number of nodules >/= 1 cm: 6-10 Number of spongiform nodules >/=  2 cm not described below (TR1): 0 Number of mixed cystic and solid nodules >/= 1.5 cm not described below (TR2): 0 _________________________________________________________ Nodule labeled 1, inferior right thyroid, 2.5 cm, previously 2.8 cm. This nodule has been previously biopsied, 09/01/2019. Assuming benign result, no further specific follow-up would be indicated. The remainder of the thyroid tissue is unchanged from the comparison ultrasound of 08/19/2019, with heterogeneous multinodular echotexture. The additional measured nodules on today's study, not previously measured appear similar and are favored to represent pseudo nodules in the setting of thyroid goiter. No adenopathy Recommendations follow those established by the new ACR TI-RADS criteria (J Am Coll Radiol 2017;14:587-595). IMPRESSION: Decreasing size of the right inferior thyroid nodule, previously biopsied. Assuming benign result,  no further specific follow-up would be indicated. Similar appearance enlarged multinodular thyroid goiter. The additionally measured nodules/tissue on today's study has similar appearance to the comparison ultrasound of 08/19/2019, and are favored to represent pseudo nodules. Electronically Signed   By: Gilmer Mor D.O.   On: 06/19/2021 15:15    Assessment & Plan:   Problem List Items Addressed This Visit     WEIGHT GAIN   Worse BMI 42 Options discussed      Relevant Orders   Cortisol   TSH   T4, free   Vitamin B12   VITAMIN D 25 Hydroxy (Vit-D Deficiency, Fractures)   Well adult exam - Primary   We discussed age appropriate health related issues, including available/recomended screening tests and vaccinations.  We discussed a need for adhering to healthy diet and exercise. Labs/EKG were reviewed/ordered. All questions were answered. Mammo q 12 mo LMP - Oct 2014 GYN q 12 mo      Relevant Orders   Cortisol   CBC with Differential/Platelet   Comprehensive metabolic panel   Hemoglobin A1c   TSH   T4, free   Urinalysis   Vitamin B12   VITAMIN D 25 Hydroxy (Vit-D Deficiency, Fractures)   Multinodular goiter   Growing slowly; some DOE is present ENT ref      Relevant Orders   Ambulatory referral to ENT   Obesity (BMI 35.0-39.9 without comorbidity)   Worse BMI 42 Options discussed      Relevant Medications   phentermine (ADIPEX-P) 37.5 MG tablet   Other Relevant Orders   Cortisol   T4, free   Urinalysis   Vitamin B12      Meds ordered this encounter  Medications   phentermine (ADIPEX-P) 37.5 MG tablet    Sig: Take 1 tablet (37.5 mg total) by mouth daily before breakfast.    Dispense:  30 tablet    Refill:  2      Follow-up: Return in about 3 months (around 08/25/2023) for a follow-up visit.  Sonda Primes, MD

## 2023-05-28 NOTE — Assessment & Plan Note (Signed)
We discussed age appropriate health related issues, including available/recomended screening tests and vaccinations. We discussed a need for adhering to healthy diet and exercise. Labs/EKG were reviewed/ordered. All questions were answered. Mammo q 12 mo LMP - Oct 2014 GYN q 12 mo 

## 2023-05-29 ENCOUNTER — Telehealth: Payer: Self-pay | Admitting: Pharmacy Technician

## 2023-05-29 ENCOUNTER — Other Ambulatory Visit (HOSPITAL_COMMUNITY): Payer: Self-pay

## 2023-05-29 NOTE — Telephone Encounter (Signed)
 Pharmacy Patient Advocate Encounter  Received notification from [facility] that Prior Authorization for PHENTERMINE 37.5MG  TABLETS has been APPROVED from 05/29/2023 to 08/27/2023. Ran test claim, Copay is $1.43. This test claim was processed through [facility]- copay amounts may vary at other pharmacies due to pharmacy/plan contracts, or as the patient moves through the different stages of their insurance plan.   PA #/Case ID/Reference #: [id number]

## 2023-05-29 NOTE — Telephone Encounter (Signed)
 Pharmacy Patient Advocate Encounter   Received notification from Onbase that prior authorization for PHENTERMINE 37.5MG  TABLETS is required/requested.   Insurance verification completed.   The patient is insured through [facility] .   Per test claim: PA required; PA submitted to above mentioned insurance via CoverMyMeds Key/confirmation #/EOC [id number] Status is pending

## 2023-05-30 ENCOUNTER — Encounter: Payer: Self-pay | Admitting: Internal Medicine

## 2023-06-17 ENCOUNTER — Other Ambulatory Visit: Payer: Self-pay | Admitting: Internal Medicine

## 2023-07-30 ENCOUNTER — Encounter (INDEPENDENT_AMBULATORY_CARE_PROVIDER_SITE_OTHER): Payer: Self-pay | Admitting: Otolaryngology

## 2023-07-30 ENCOUNTER — Ambulatory Visit (INDEPENDENT_AMBULATORY_CARE_PROVIDER_SITE_OTHER): Admitting: Otolaryngology

## 2023-07-30 VITALS — BP 118/79 | HR 99 | Ht 64.0 in | Wt 230.0 lb

## 2023-07-30 DIAGNOSIS — K219 Gastro-esophageal reflux disease without esophagitis: Secondary | ICD-10-CM | POA: Diagnosis not present

## 2023-07-30 DIAGNOSIS — I10 Essential (primary) hypertension: Secondary | ICD-10-CM

## 2023-07-30 DIAGNOSIS — R221 Localized swelling, mass and lump, neck: Secondary | ICD-10-CM

## 2023-07-30 DIAGNOSIS — E669 Obesity, unspecified: Secondary | ICD-10-CM

## 2023-07-30 DIAGNOSIS — R041 Hemorrhage from throat: Secondary | ICD-10-CM | POA: Diagnosis not present

## 2023-07-30 DIAGNOSIS — E042 Nontoxic multinodular goiter: Secondary | ICD-10-CM | POA: Diagnosis not present

## 2023-07-30 DIAGNOSIS — E041 Nontoxic single thyroid nodule: Secondary | ICD-10-CM

## 2023-07-30 DIAGNOSIS — R29898 Other symptoms and signs involving the musculoskeletal system: Secondary | ICD-10-CM

## 2023-07-30 NOTE — Progress Notes (Signed)
 ENT CONSULT:  Reason for Consult: thyroid  goiter multiple thyroid  nodules  and right sided neck swelling   HPI: Discussed the use of AI scribe software for clinical note transcription with the patient, who gave verbal consent to proceed.  History of Present Illness Amber Reilly is a 58 year old female with hx of thyroid  goiter and multiple thyroid  nodules who presents with symptoms of neck tightness/swelling.   She has a history of multiple thyroid  nodules that have been monitored over the years, with several biopsies confirming benign results. Currently, she presents with noticeable swelling on the right side of her neck, which is palpable laterally. She experiences occasional difficulty swallowing when phlegm is present but denies significant symptoms such as voice changes. She has some trouble breathing with exertion. No history of heartburn or reflux is reported. She feels that her neck is tight.  Her last thyroid  ultrasound was conducted in 2021, and no additional imaging has been performed since. Thyroid  function tests have consistently returned normal results (TSH).  She denies any family history of autoimmune thyroid  diseases such as Graves' disease or Hashimoto's thyroiditis. No radiation to the neck.   She is currently on for hypertension and denies taking any blood thinners. There is no history of heart disease, lung disease, strokes, or blood clots.   Records Reviewed:  PCP OV 05/28/23    Multinodular goiter     Growing slowly; some DOE is present ENT ref        Relevant Orders    Ambulatory referral to ENT    Obesity (BMI 35.0-39.9 without comorbidity)    Worse BMI 42 Options discussed     Past Medical History:  Diagnosis Date   Hypertension     Past Surgical History:  Procedure Laterality Date   CESAREAN SECTION     ENDOMETRIAL ABLATION     FOOT SURGERY Left    KNEE SURGERY Left    LAPAROTOMY N/A 11/26/2017   Procedure: EXPLORATORY LAPAROTOMY;  Surgeon:  Alphonso Aschoff, MD;  Location: [facility];  Service: Gynecology;  Laterality: N/A;   UMBILICAL HERNIA REPAIR N/A 11/26/2017   Procedure: HERNIA REPAIR UMBILICAL ADULT;  Surgeon: Alphonso Aschoff, MD;  Location: [facility];  Service: Gynecology;  Laterality: N/A;   UNILATERAL SALPINGECTOMY Right 11/26/2017   Procedure: RIGHT SALPINGO-OOPHORECTOMY;  Surgeon: Alphonso Aschoff, MD;  Location: [facility];  Service: Gynecology;  Laterality: Right;    Family History  Problem Relation Age of Onset   Heart attack Father    Cancer Other    Goiter Paternal Aunt    Colon cancer Neg Hx    Esophageal cancer Neg Hx    Stomach cancer Neg Hx     Social History:  reports that she has never smoked. She has never used smokeless tobacco. She reports that she does not drink alcohol and does not use drugs.  Allergies:  Allergies  Allergen Reactions   Tramadol  Other (See Comments)    Bad headache   Latex Swelling    Selling at the site of contact only   Morphine And Codeine     Patient does not like the way it makes her feel     Medications: I have reviewed the patient's current medications.  The PMH, PSH, Medications, Allergies, and SH were reviewed and updated.  ROS: Constitutional: Negative for fever, weight loss and weight gain. Cardiovascular: Negative for chest pain and dyspnea on exertion. Respiratory: Is not experiencing shortness of breath at rest. Gastrointestinal: Negative for nausea and vomiting.  Neurological: Negative for headaches. Psychiatric: The patient is not nervous/anxious  Blood pressure 118/79, pulse 99, height 5\' 4"  (1.626 m), weight 230 lb (104.3 kg), SpO2 97%. Body mass index is 39.48 kg/m.  PHYSICAL EXAM:  Exam: General: Well-developed, well-nourished Communication and Voice: slightly raspy Respiratory Respiratory effort: Equal inspiration and expiration without stridor Cardiovascular Peripheral Vascular: Warm extremities with equal color/perfusion Eyes: No nystagmus with equal  extraocular motion bilaterally Neuro/Psych/Balance: Patient oriented to person, place, and time; Appropriate mood and affect; Gait is intact with no imbalance; Cranial nerves I-XII are intact Head and Face Inspection: Normocephalic and atraumatic without mass or lesion Palpation: Facial skeleton intact without bony stepoffs Salivary Glands: No mass or tenderness Facial Strength: Facial motility symmetric and full bilaterally ENT Pinna: External ear intact and fully developed External canal: Canal is patent with intact skin Tympanic Membrane: Clear and mobile External Nose: No scar or anatomic deformity Internal Nose: Septum is relatively straight. No polyp, or purulence. Mucosal edema and erythema present.  Bilateral inferior turbinate hypertrophy.  Lips, Teeth, and gums: Mucosa and teeth intact and viable TMJ: No pain to palpation with full mobility Oral cavity/oropharynx: No erythema or exudate, no lesions present Nasopharynx: No mass or lesion with intact mucosa Hypopharynx: Intact mucosa without pooling of secretions Larynx Glottic: Full true vocal cord mobility without lesion or mass Supraglottic: Normal appearing epiglottis and AE folds Interarytenoid Space: Moderate to severe pachydermia&edema Subglottic Space: Patent without lesion or edema Neck Neck and Trachea: Midline trachea without mass or lesion Thyroid : No mass or nodularity Lymphatics: No lymphadenopathy  Procedure: Preoperative diagnosis: large thyroid  goiter dyspnea on exertion and neck tightness   Postoperative diagnosis:   Same + GERD LPR and R VF hemorrhage   Procedure: Flexible fiberoptic laryngoscopy  Surgeon: Artice Last, MD  Anesthesia: Topical lidocaine  and Afrin Complications: None Condition is stable throughout exam  Indications and consent:  The patient presents to the clinic with above symptoms. Indirect laryngoscopy view was incomplete. Thus it was recommended that they undergo a flexible  fiberoptic laryngoscopy. All of the risks, benefits, and potential complications were reviewed with the patient preoperatively and verbal informed consent was obtained.  Procedure: The patient was seated upright in the clinic. Topical lidocaine  and Afrin were applied to the nasal cavity. After adequate anesthesia had occurred, I then proceeded to pass the flexible telescope into the nasal cavity. The nasal cavity was patent without rhinorrhea or polyp. The nasopharynx was also patent without mass or lesion. The base of tongue was visualized and was normal. There were no signs of pooling of secretions in the piriform sinuses. The true vocal folds were mobile bilaterally. There were no signs of glottic or supraglottic mucosal lesion or mass but there was evidence of R VF hemorrhage. There was moderate to severe interarytenoid pachydermia and post cricoid edema. The telescope was then slowly withdrawn and the patient tolerated the procedure throughout.      Studies Reviewed: Thyroid  U/S 06/19/21 CLINICAL DATA:  58 year old female with a history thyroid  nodules   Prior biopsy of right-sided mid thyroid  nodule 09/01/2019   Prior biopsy of left thyroid  nodule 01/08/2012   Prior biopsy of left inferior thyroid  nodule 03/20/2005  IMPRESSION: Decreasing size of the right inferior thyroid  nodule, previously biopsied. Assuming benign result, no further specific follow-up would be indicated.   Similar appearance enlarged multinodular thyroid  goiter. The additionally measured nodules/tissue on today's study has similar appearance to the comparison ultrasound of 08/19/2019, and are favored to represent pseudo nodules  Thyroid  U/S 08/19/19 CLINICAL DATA:  Multinodular goiter. Previous FNA biopsy dominant left medial nodule 01/08/2012.  IMPRESSION: 1. Progressive thyromegaly with enlarging medial right nodule. Recommend FNA biopsy.  FNA results  09/01/19 Specimen Submitted:  A. THYROID , RIGHT  MEDIAL, FINE NEEDLE ASPIRATION:   FINAL MICROSCOPIC DIAGNOSIS:  - Consistent with benign follicular nodule (Bethesda category II)   Left medial thyroid  nodule - Benign Bethesda II 01/08/2012  TSH normal Feb 2025 and in the past  Assessment/Plan: Encounter Diagnoses  Name Primary?   Multinodular goiter Yes   Thyroid  nodule    Neck swelling    Neck tightness    Obesity (BMI 35.0-39.9 without comorbidity)     Assessment and Plan Assessment & Plan Thyroid  goiter with compressive symptoms Multiple benign thyroid  nodules with long-standing hx of thyroid  goiter, slowly increased in size with current compressive symptoms. Large goiter necessitates total thyroidectomy due to size and compressive sx. Had several nodules, with prior biopsies (+) Bethesda II benign. TSH normal. Discussed surgical risks including recurrent laryngeal nerve injury and lifelong hormone replacement. Surgery advised due to size after imaging. Risks and benefits of surgery discussed and she would like to proceed.  - Order CT scan of the neck for surgical planning. - Order thyroid  ultrasound to assess thyroid  architecture and nodules (last one done 2021) - Schedule total thyroidectomy after imaging  - Send preoperative clearance form to primary care physician. - Monitor calcium levels postoperatively. - Start thyroid  hormone replacement postoperatively. - Refer to endocrinology for postoperative follow-up on thyroid  hormone levels    R VF hemorrhage  Noted on flexible scope exam - likely related to phonotrauma  - voice hygiene discussed today - b/l VF are mobile otherwise   GERD LPR -  Reflux Gourmet after meals - diet and lifestyle changes to minimize GERD - Refer to [employer] blog for dietary and lifestyle modifications/reflux cook book  Hypertension Hypertension controlled with medication. - Continue current antihypertensive medication.  F/u - CT neck and thyroid  U/S - schedule for surgery  -  Endocrine consult post-op - referral sent   Thank you for allowing me to participate in the care of this patient. Please do not hesitate to contact me with any questions or concerns.   Artice Last, MD Otolaryngology [facility] Phone: [phone] Fax: [phone]    07/30/2023, 3:14 PM

## 2023-07-30 NOTE — Patient Instructions (Addendum)
 What to Expect During a Total Thyroidectomy Total thyroidectomy involves the removal of the entire thyroid  gland. This procedure is most often performed to treat thyroid  cancer, but it may also be performed to treat uncontrollable hyperthyroidism or goiter that causes severe symptoms.  If you're having thyroidectomy as a result of thyroid  cancer, we may remove lymph nodes around your thyroid  to be examined by a pathologist. We use the smallest incisions possible, to limit scarring to your neck and deliver the best possible cosmetic results. General anesthesia is used during a thyroidectomy, and you'll usually stay in the hospital for one night following the procedure.  After your procedure, you will need to take thyroid  hormone for the rest of your life, because your thyroid  gland will no longer supply you with the necessary hormone. Your primary care provider or endocrinologist will do blood tests to ensure that you are getting the right amount of thyroid  hormone. You might also have to take supplements after thyroidectomy to balance your calcium levels.  In the weeks after your thyroidectomy, you may have neck pain, soreness of your vocal chords or a weak voice. These symptoms are usually temporary.  What to Expect During a Thyroid  Lobectomy A thyroid  lobectomy is used to remove one of your two thyroid  lobes, leaving the other intact. We may perform this type of surgery if there are nodules that cause symptoms or could be cancerous. We also use it to treat excessive hormone production like that associated with hyperthyroidism.  If you are having a thyroid  lobectomy because of an indeterminate biopsy result, we will send the tissues collected to a pathologist for examination. If cancer is found, you might have to undergo a second surgery to ensure that all of the cancerous tissue is removed.  A thyroid  lobectomy is performed under general anesthesia and is often an outpatient procedure. However, if  you require a hospital stay, our nursing staff are experts in managing your post-operative care and transitioning you to continued recovery at home.  After a thyroid  lobectomy, you'll need to have your thyroid  levels checked. Depending on the results, you may need thyroid  hormone replacement.  Who is a candidate for thyroidectomy versus thyroid  lobectomy? In some cases, both a thyroidectomy and a thyroid  lobectomy are treatment options. The surgery performed will depend of your preference, and we can help you choose what's best based on your specific needs.  In other cases, including the instances below, there is a clear indication that one surgical option is better than the other.  For example:  If you are taking thyroid  hormone replacements or have several nodules on your thyroid , it's usually suggested that you have a thyroidectomy. If you have diffuse thyroiditis -- inflammation of the thyroid  gland that causes hypothyroidism -- a toxic nodule or one specific nodule that needs to be removed, a thyroid  lobectomy is often the treatment of choice. If, after a thyroid  biopsy, a pathologist cannot reach a conclusion on whether a nodule is cancerous, a lobectomy is often considered. About 20 percent of thyroid  biopsies result in indeterminate test results. The tissue removed in the lobectomy will then be examined by a pathologist. If cancer is found, you might have to undergo a second surgery to ensure all of the cancerous tissue is removed.  [email or web address] - check out this website to learn more about reflux   -Avoid lying down for at least two hours after a meal or after drinking acidic beverages, like soda, or other caffeinated beverages. This can  help to prevent stomach contents from flowing back into the esophagus. -Keep your head elevated while you sleep. Using an extra pillow or two can also help to prevent reflux. -Eat smaller and more frequent meals each day instead of a few large  meals. This promotes digestion and can aid in preventing heartburn. -Wear loose-fitting clothes to ease pressure on the stomach, which can worsen heartburn and reflux. -Reduce excess weight around the midsection. This can ease pressure on the stomach. Such pressure can force some stomach contents back up the esophagus - Take Reflux Gourmet (natural supplement available on [employer]) to help with symptoms of chronic throat irritation

## 2023-07-30 NOTE — H&P (View-Only) (Signed)
 ENT CONSULT:  Reason for Consult: thyroid  goiter multiple thyroid  nodules  and right sided neck swelling   HPI: Discussed the use of AI scribe software for clinical note transcription with the patient, who gave verbal consent to proceed.  History of Present Illness Amber Reilly is a 58 year old female with hx of thyroid  goiter and multiple thyroid  nodules who presents with symptoms of neck tightness/swelling.   She has a history of multiple thyroid  nodules that have been monitored over the years, with several biopsies confirming benign results. Currently, she presents with noticeable swelling on the right side of her neck, which is palpable laterally. She experiences occasional difficulty swallowing when phlegm is present but denies significant symptoms such as voice changes. She has some trouble breathing with exertion. No history of heartburn or reflux is reported. She feels that her neck is tight.  Her last thyroid  ultrasound was conducted in 2021, and no additional imaging has been performed since. Thyroid  function tests have consistently returned normal results (TSH).  She denies any family history of autoimmune thyroid  diseases such as Graves' disease or Hashimoto's thyroiditis. No radiation to the neck.   She is currently on for hypertension and denies taking any blood thinners. There is no history of heart disease, lung disease, strokes, or blood clots.   Records Reviewed:  PCP OV 05/28/23    Multinodular goiter     Growing slowly; some DOE is present ENT ref        Relevant Orders    Ambulatory referral to ENT    Obesity (BMI 35.0-39.9 without comorbidity)    Worse BMI 42 Options discussed     Past Medical History:  Diagnosis Date   Hypertension     Past Surgical History:  Procedure Laterality Date   CESAREAN SECTION     ENDOMETRIAL ABLATION     FOOT SURGERY Left    KNEE SURGERY Left    LAPAROTOMY N/A 11/26/2017   Procedure: EXPLORATORY LAPAROTOMY;  Surgeon:  Alphonso Aschoff, MD;  Location: [facility];  Service: Gynecology;  Laterality: N/A;   UMBILICAL HERNIA REPAIR N/A 11/26/2017   Procedure: HERNIA REPAIR UMBILICAL ADULT;  Surgeon: Alphonso Aschoff, MD;  Location: [facility];  Service: Gynecology;  Laterality: N/A;   UNILATERAL SALPINGECTOMY Right 11/26/2017   Procedure: RIGHT SALPINGO-OOPHORECTOMY;  Surgeon: Alphonso Aschoff, MD;  Location: [facility];  Service: Gynecology;  Laterality: Right;    Family History  Problem Relation Age of Onset   Heart attack Father    Cancer Other    Goiter Paternal Aunt    Colon cancer Neg Hx    Esophageal cancer Neg Hx    Stomach cancer Neg Hx     Social History:  reports that she has never smoked. She has never used smokeless tobacco. She reports that she does not drink alcohol and does not use drugs.  Allergies:  Allergies  Allergen Reactions   Tramadol  Other (See Comments)    Bad headache   Latex Swelling    Selling at the site of contact only   Morphine And Codeine     Patient does not like the way it makes her feel     Medications: I have reviewed the patient's current medications.  The PMH, PSH, Medications, Allergies, and SH were reviewed and updated.  ROS: Constitutional: Negative for fever, weight loss and weight gain. Cardiovascular: Negative for chest pain and dyspnea on exertion. Respiratory: Is not experiencing shortness of breath at rest. Gastrointestinal: Negative for nausea and vomiting.  Neurological: Negative for headaches. Psychiatric: The patient is not nervous/anxious  Blood pressure 118/79, pulse 99, height 5\' 4"  (1.626 m), weight 230 lb (104.3 kg), SpO2 97%. Body mass index is 39.48 kg/m.  PHYSICAL EXAM:  Exam: General: Well-developed, well-nourished Communication and Voice: slightly raspy Respiratory Respiratory effort: Equal inspiration and expiration without stridor Cardiovascular Peripheral Vascular: Warm extremities with equal color/perfusion Eyes: No nystagmus with equal  extraocular motion bilaterally Neuro/Psych/Balance: Patient oriented to person, place, and time; Appropriate mood and affect; Gait is intact with no imbalance; Cranial nerves I-XII are intact Head and Face Inspection: Normocephalic and atraumatic without mass or lesion Palpation: Facial skeleton intact without bony stepoffs Salivary Glands: No mass or tenderness Facial Strength: Facial motility symmetric and full bilaterally ENT Pinna: External ear intact and fully developed External canal: Canal is patent with intact skin Tympanic Membrane: Clear and mobile External Nose: No scar or anatomic deformity Internal Nose: Septum is relatively straight. No polyp, or purulence. Mucosal edema and erythema present.  Bilateral inferior turbinate hypertrophy.  Lips, Teeth, and gums: Mucosa and teeth intact and viable TMJ: No pain to palpation with full mobility Oral cavity/oropharynx: No erythema or exudate, no lesions present Nasopharynx: No mass or lesion with intact mucosa Hypopharynx: Intact mucosa without pooling of secretions Larynx Glottic: Full true vocal cord mobility without lesion or mass Supraglottic: Normal appearing epiglottis and AE folds Interarytenoid Space: Moderate to severe pachydermia&edema Subglottic Space: Patent without lesion or edema Neck Neck and Trachea: Midline trachea without mass or lesion Thyroid : No mass or nodularity Lymphatics: No lymphadenopathy  Procedure: Preoperative diagnosis: large thyroid  goiter dyspnea on exertion and neck tightness   Postoperative diagnosis:   Same + GERD LPR and R VF hemorrhage   Procedure: Flexible fiberoptic laryngoscopy  Surgeon: Artice Last, MD  Anesthesia: Topical lidocaine  and Afrin Complications: None Condition is stable throughout exam  Indications and consent:  The patient presents to the clinic with above symptoms. Indirect laryngoscopy view was incomplete. Thus it was recommended that they undergo a flexible  fiberoptic laryngoscopy. All of the risks, benefits, and potential complications were reviewed with the patient preoperatively and verbal informed consent was obtained.  Procedure: The patient was seated upright in the clinic. Topical lidocaine  and Afrin were applied to the nasal cavity. After adequate anesthesia had occurred, I then proceeded to pass the flexible telescope into the nasal cavity. The nasal cavity was patent without rhinorrhea or polyp. The nasopharynx was also patent without mass or lesion. The base of tongue was visualized and was normal. There were no signs of pooling of secretions in the piriform sinuses. The true vocal folds were mobile bilaterally. There were no signs of glottic or supraglottic mucosal lesion or mass but there was evidence of R VF hemorrhage. There was moderate to severe interarytenoid pachydermia and post cricoid edema. The telescope was then slowly withdrawn and the patient tolerated the procedure throughout.      Studies Reviewed: Thyroid  U/S 06/19/21 CLINICAL DATA:  58 year old female with a history thyroid  nodules   Prior biopsy of right-sided mid thyroid  nodule 09/01/2019   Prior biopsy of left thyroid  nodule 01/08/2012   Prior biopsy of left inferior thyroid  nodule 03/20/2005  IMPRESSION: Decreasing size of the right inferior thyroid  nodule, previously biopsied. Assuming benign result, no further specific follow-up would be indicated.   Similar appearance enlarged multinodular thyroid  goiter. The additionally measured nodules/tissue on today's study has similar appearance to the comparison ultrasound of 08/19/2019, and are favored to represent pseudo nodules  Thyroid  U/S 08/19/19 CLINICAL DATA:  Multinodular goiter. Previous FNA biopsy dominant left medial nodule 01/08/2012.  IMPRESSION: 1. Progressive thyromegaly with enlarging medial right nodule. Recommend FNA biopsy.  FNA results  09/01/19 Specimen Submitted:  A. THYROID , RIGHT  MEDIAL, FINE NEEDLE ASPIRATION:   FINAL MICROSCOPIC DIAGNOSIS:  - Consistent with benign follicular nodule (Bethesda category II)   Left medial thyroid  nodule - Benign Bethesda II 01/08/2012  TSH normal Feb 2025 and in the past  Assessment/Plan: Encounter Diagnoses  Name Primary?   Multinodular goiter Yes   Thyroid  nodule    Neck swelling    Neck tightness    Obesity (BMI 35.0-39.9 without comorbidity)     Assessment and Plan Assessment & Plan Thyroid  goiter with compressive symptoms Multiple benign thyroid  nodules with long-standing hx of thyroid  goiter, slowly increased in size with current compressive symptoms. Large goiter necessitates total thyroidectomy due to size and compressive sx. Had several nodules, with prior biopsies (+) Bethesda II benign. TSH normal. Discussed surgical risks including recurrent laryngeal nerve injury and lifelong hormone replacement. Surgery advised due to size after imaging. Risks and benefits of surgery discussed and she would like to proceed.  - Order CT scan of the neck for surgical planning. - Order thyroid  ultrasound to assess thyroid  architecture and nodules (last one done 2021) - Schedule total thyroidectomy after imaging  - Send preoperative clearance form to primary care physician. - Monitor calcium levels postoperatively. - Start thyroid  hormone replacement postoperatively. - Refer to endocrinology for postoperative follow-up on thyroid  hormone levels    R VF hemorrhage  Noted on flexible scope exam - likely related to phonotrauma  - voice hygiene discussed today - b/l VF are mobile otherwise   GERD LPR -  Reflux Gourmet after meals - diet and lifestyle changes to minimize GERD - Refer to [employer] blog for dietary and lifestyle modifications/reflux cook book  Hypertension Hypertension controlled with medication. - Continue current antihypertensive medication.  F/u - CT neck and thyroid  U/S - schedule for surgery  -  Endocrine consult post-op - referral sent   Thank you for allowing me to participate in the care of this patient. Please do not hesitate to contact me with any questions or concerns.   Artice Last, MD Otolaryngology [facility] Phone: [phone] Fax: [phone]    07/30/2023, 3:14 PM

## 2023-08-03 ENCOUNTER — Encounter: Payer: Self-pay | Admitting: Internal Medicine

## 2023-08-04 NOTE — Pre-Procedure Instructions (Signed)
 Surgical Instructions   Your procedure is scheduled on Aug 11, 2023. Report to [facility] Main Entrance "A" at 10:30 A.M., then check in with the Admitting office. Any questions or running late day of surgery: call [phone]  Questions prior to your surgery date: call [phone], Monday-Friday, 8am-4pm. If you experience any cold or flu symptoms such as cough, fever, chills, shortness of breath, etc. between now and your scheduled surgery, please notify us  at the above number.     Remember:  Do not eat or drink after midnight the night before your surgery   Take these medicines the morning of surgery with A SIP OF WATER: NONE   One week prior to surgery, STOP taking any Aspirin (unless otherwise instructed by your surgeon) Aleve, Naproxen, Ibuprofen , Motrin , Advil , Goody's, BC's, all herbal medications, fish oil, and non-prescription vitamins.                     Do NOT Smoke (Tobacco/Vaping) for 24 hours prior to your procedure.  If you use a CPAP at night, you may bring your mask/headgear for your overnight stay.   You will be asked to remove any contacts, glasses, piercing's, hearing aid's, dentures/partials prior to surgery. Please bring cases for these items if needed.    Patients discharged the day of surgery will not be allowed to drive home, and someone needs to stay with them for 24 hours.  SURGICAL WAITING ROOM VISITATION Patients may have no more than 2 support people in the waiting area - these visitors may rotate.   Pre-op nurse will coordinate an appropriate time for 1 ADULT support person, who may not rotate, to accompany patient in pre-op.  Children under the age of 58 must have an adult with them who is not the patient and must remain in the main waiting area with an adult.  If the patient needs to stay at the hospital during part of their recovery, the visitor guidelines for inpatient rooms apply.  Please refer to the [facility] website for the visitor  guidelines for any additional information.   If you received a COVID test during your pre-op visit  it is requested that you wear a mask when out in public, stay away from anyone that may not be feeling well and notify your surgeon if you develop symptoms. If you have been in contact with anyone that has tested positive in the last 10 days please notify you surgeon.      Pre-operative CHG Bathing Instructions   You can play a key role in reducing the risk of infection after surgery. Your skin needs to be as free of germs as possible. You can reduce the number of germs on your skin by washing with CHG (chlorhexidine  gluconate) soap before surgery. CHG is an antiseptic soap that kills germs and continues to kill germs even after washing.   DO NOT use if you have an allergy to chlorhexidine /CHG or antibacterial soaps. If your skin becomes reddened or irritated, stop using the CHG and notify one of our RNs at [phone].              TAKE A SHOWER THE NIGHT BEFORE SURGERY AND THE DAY OF SURGERY    Please keep in mind the following:  DO NOT shave, including legs and underarms, 48 hours prior to surgery.   You may shave your face before/day of surgery.  Place clean sheets on your bed the night before surgery Use a clean washcloth (  not used since being washed) for each shower. DO NOT sleep with pet's night before surgery.  CHG Shower Instructions:  Wash your face and private area with normal soap. If you choose to wash your hair, wash first with your normal shampoo.  After you use shampoo/soap, rinse your hair and body thoroughly to remove shampoo/soap residue.  Turn the water OFF and apply half the bottle of CHG soap to a CLEAN washcloth.  Apply CHG soap ONLY FROM YOUR NECK DOWN TO YOUR TOES (washing for 3-5 minutes)  DO NOT use CHG soap on face, private areas, open wounds, or sores.  Pay special attention to the area where your surgery is being performed.  If you are having back surgery,  having someone wash your back for you may be helpful. Wait 2 minutes after CHG soap is applied, then you may rinse off the CHG soap.  Pat dry with a clean towel  Put on clean pajamas    Additional instructions for the day of surgery: DO NOT APPLY any lotions, deodorants, cologne, or perfumes.   Do not wear jewelry or makeup Do not wear nail polish, gel polish, artificial nails, or any other type of covering on natural nails (fingers and toes) Do not bring valuables to the hospital. [facility] is not responsible for valuables/personal belongings. Put on clean/comfortable clothes.  Please brush your teeth.  Ask your nurse before applying any prescription medications to the skin.

## 2023-08-05 ENCOUNTER — Encounter (HOSPITAL_COMMUNITY): Payer: Self-pay

## 2023-08-05 ENCOUNTER — Encounter (HOSPITAL_COMMUNITY)
Admission: RE | Admit: 2023-08-05 | Discharge: 2023-08-05 | Disposition: A | Discharge status: Home / Self Care | Source: Ambulatory Visit | Attending: Otolaryngology | Admitting: Otolaryngology

## 2023-08-05 ENCOUNTER — Other Ambulatory Visit: Payer: Self-pay

## 2023-08-05 VITALS — BP 120/74 | HR 76 | Temp 98.3°F | Resp 16 | Ht 64.0 in | Wt 240.9 lb

## 2023-08-05 DIAGNOSIS — Z01818 Encounter for other preprocedural examination: Secondary | ICD-10-CM | POA: Diagnosis present

## 2023-08-05 DIAGNOSIS — I2510 Atherosclerotic heart disease of native coronary artery without angina pectoris: Secondary | ICD-10-CM | POA: Diagnosis not present

## 2023-08-05 HISTORY — DX: Unspecified osteoarthritis, unspecified site: M19.90

## 2023-08-05 LAB — CBC
HCT: 37.3 % (ref 36.0–46.0)
Hemoglobin: 11.6 g/dL — ABNORMAL LOW (ref 12.0–15.0)
MCH: 27.4 pg (ref 26.0–34.0)
MCHC: 31.1 g/dL (ref 30.0–36.0)
MCV: 88.2 fL (ref 80.0–100.0)
Platelets: 250 10*3/uL (ref 150–400)
RBC: 4.23 MIL/uL (ref 3.87–5.11)
RDW: 14.6 % (ref 11.5–15.5)
WBC: 5.7 10*3/uL (ref 4.0–10.5)
nRBC: 0 % (ref 0.0–0.2)

## 2023-08-05 LAB — BASIC METABOLIC PANEL WITH GFR
Anion gap: 10 (ref 5–15)
BUN: 13 mg/dL (ref 6–20)
CO2: 28 mmol/L (ref 22–32)
Calcium: 9.4 mg/dL (ref 8.9–10.3)
Chloride: 102 mmol/L (ref 98–111)
Creatinine, Ser: 1.04 mg/dL — ABNORMAL HIGH (ref 0.44–1.00)
GFR, Estimated: >60 mL/min (ref >60)
Glucose, Bld: 101 mg/dL — ABNORMAL HIGH (ref 70–99)
Potassium: 3.4 mmol/L — ABNORMAL LOW (ref 3.5–5.1)
Sodium: 140 mmol/L (ref 135–145)

## 2023-08-05 NOTE — Progress Notes (Signed)
 PCP - Dr. Adelaide Holy Cardiologist - Denies  PPM/ICD - Denies Device Orders - n/a Rep Notified - n/a  Chest x-ray - n/a EKG - 08/05/2023 Stress Test - Denies ECHO - Denies Cardiac Cath - Denies  Sleep Study - Denies CPAP - n/a  No DM  Last dose of GLP1 agonist- n/a GLP1 instructions: n/a  Blood Thinner Instructions: n/a Aspirin Instructions: n/a  NPO after midnight  COVID TEST- n/a   Anesthesia review: Yes. Borderline EKG review and pt has large bilateral thyroid  goiter. CT scan of neck to be completed 08/06/23 at [facility]. Discussed with Rudy Costain, PA-C who will review scans once available. Pt also confirmed that she is not taking her Phentermine  anymore.   Patient denies shortness of breath, fever, cough and chest pain at PAT appointment. Pt denies any respiratory illness/infection in the last two months.    All instructions explained to the patient, with a verbal understanding of the material. Patient agrees to go over the instructions while at home for a better understanding. Patient also instructed to self quarantine after being tested for COVID-19. The opportunity to ask questions was provided.

## 2023-08-06 ENCOUNTER — Ambulatory Visit (HOSPITAL_COMMUNITY)
Admission: RE | Admit: 2023-08-06 | Discharge: 2023-08-06 | Disposition: A | Discharge status: Home / Self Care | Source: Ambulatory Visit | Attending: Otolaryngology | Admitting: Otolaryngology

## 2023-08-06 DIAGNOSIS — E042 Nontoxic multinodular goiter: Secondary | ICD-10-CM | POA: Diagnosis present

## 2023-08-06 DIAGNOSIS — R221 Localized swelling, mass and lump, neck: Secondary | ICD-10-CM | POA: Diagnosis present

## 2023-08-06 DIAGNOSIS — E041 Nontoxic single thyroid nodule: Secondary | ICD-10-CM

## 2023-08-06 MED ORDER — IOHEXOL 300 MG/ML  SOLN
75.0000 mL | Freq: Once | INTRAMUSCULAR | Status: AC | PRN
  Administered 2023-08-06: 75 mL via INTRAVENOUS

## 2023-08-06 MED ORDER — SODIUM CHLORIDE (PF) 0.9 % IJ SOLN
INTRAMUSCULAR | Status: AC
Start: 2023-08-06 — End: ?
  Filled 2023-08-06: qty 50

## 2023-08-10 NOTE — Anesthesia Preprocedure Evaluation (Signed)
 Anesthesia Evaluation  Patient identified by MRN, date of birth, ID band Patient awake    Reviewed: Allergy & Precautions, H&P , NPO status , Patient's Chart, lab work & pertinent test results  Airway Mallampati: III  TM Distance: >3 FB Neck ROM: Full Positive for:  Tracheal deviation   Dental  (+) Teeth Intact, Dental Advisory Given   Pulmonary neg pulmonary ROS   Pulmonary exam normal breath sounds clear to auscultation       Cardiovascular hypertension, Normal cardiovascular exam Rhythm:Regular Rate:Normal     Neuro/Psych  Headaches  negative psych ROS   GI/Hepatic negative GI ROS, Neg liver ROS,,,  Endo/Other  negative endocrine ROS    Renal/GU negative Renal ROS  negative genitourinary   Musculoskeletal  (+) Arthritis , Osteoarthritis,    Abdominal  (+) + obese  Peds negative pediatric ROS (+)  Hematology negative hematology ROS (+)   Anesthesia Other Findings   Reproductive/Obstetrics negative OB ROS                             Anesthesia Physical Anesthesia Plan  ASA: 3  Anesthesia Plan: General   Post-op Pain Management: Tylenol  PO (pre-op)* and Dilaudid  IV   Induction: Intravenous  PONV Risk Score and Plan: 3 and Ondansetron , Dexamethasone , Midazolam  and Treatment may vary due to age or medical condition  Airway Management Planned: Oral ETT and Video Laryngoscope Planned  Additional Equipment: None  Intra-op Plan:   Post-operative Plan: Extubation in OR  Informed Consent: I have reviewed the patients History and Physical, chart, labs and discussed the procedure including the risks, benefits and alternatives for the proposed anesthesia with the patient or authorized representative who has indicated his/her understanding and acceptance.     Dental advisory given  Plan Discussed with: CRNA  Anesthesia Plan Comments: ( large goiter with rightward tracheal deviation,  but still able to lay flat and sleep without multiple pillows. Some dysphagia with solids, but no other compressive symptoms. Will go to sleep without muscle relaxant and attempt to maintain spontaneous ventilation, intubation w/ glidescope. Fiberoptic and difficult airway cart available in room. Will attempt 7.0 NIMS ETT with 6.0 also available. )        Anesthesia Quick Evaluation

## 2023-08-11 ENCOUNTER — Other Ambulatory Visit: Payer: Self-pay

## 2023-08-11 ENCOUNTER — Observation Stay (HOSPITAL_COMMUNITY)
Admission: RE | Admit: 2023-08-11 | Discharge: 2023-08-12 | Disposition: A | Discharge status: Home / Self Care | Attending: Otolaryngology | Admitting: Otolaryngology

## 2023-08-11 ENCOUNTER — Ambulatory Visit (HOSPITAL_COMMUNITY): Payer: Self-pay | Admitting: Anesthesiology

## 2023-08-11 ENCOUNTER — Encounter (HOSPITAL_COMMUNITY): Payer: Self-pay

## 2023-08-11 ENCOUNTER — Ambulatory Visit (HOSPITAL_BASED_OUTPATIENT_CLINIC_OR_DEPARTMENT_OTHER): Payer: Self-pay | Admitting: Anesthesiology

## 2023-08-11 ENCOUNTER — Encounter (HOSPITAL_COMMUNITY)
Admission: RE | Disposition: A | Discharge status: Home / Self Care | Payer: Self-pay | Source: Home / Self Care | Attending: Otolaryngology

## 2023-08-11 DIAGNOSIS — E041 Nontoxic single thyroid nodule: Secondary | ICD-10-CM | POA: Insufficient documentation

## 2023-08-11 DIAGNOSIS — E042 Nontoxic multinodular goiter: Secondary | ICD-10-CM

## 2023-08-11 DIAGNOSIS — K219 Gastro-esophageal reflux disease without esophagitis: Secondary | ICD-10-CM | POA: Insufficient documentation

## 2023-08-11 DIAGNOSIS — R1310 Dysphagia, unspecified: Secondary | ICD-10-CM | POA: Diagnosis not present

## 2023-08-11 DIAGNOSIS — Z91040 Latex allergy status: Secondary | ICD-10-CM | POA: Diagnosis not present

## 2023-08-11 DIAGNOSIS — R221 Localized swelling, mass and lump, neck: Secondary | ICD-10-CM | POA: Insufficient documentation

## 2023-08-11 DIAGNOSIS — I10 Essential (primary) hypertension: Secondary | ICD-10-CM | POA: Insufficient documentation

## 2023-08-11 DIAGNOSIS — E049 Nontoxic goiter, unspecified: Principal | ICD-10-CM | POA: Diagnosis present

## 2023-08-11 HISTORY — PX: THYROIDECTOMY: SHX17

## 2023-08-11 LAB — CALCIUM: Calcium: 8.5 mg/dL — ABNORMAL LOW (ref 8.9–10.3)

## 2023-08-11 LAB — CBC
HCT: 33.6 % — ABNORMAL LOW (ref 36.0–46.0)
Hemoglobin: 10.4 g/dL — ABNORMAL LOW (ref 12.0–15.0)
MCH: 26.9 pg (ref 26.0–34.0)
MCHC: 31 g/dL (ref 30.0–36.0)
MCV: 87 fL (ref 80.0–100.0)
Platelets: 229 10*3/uL (ref 150–400)
RBC: 3.86 MIL/uL — ABNORMAL LOW (ref 3.87–5.11)
RDW: 14.6 % (ref 11.5–15.5)
WBC: 8.3 10*3/uL (ref 4.0–10.5)
nRBC: 0 % (ref 0.0–0.2)

## 2023-08-11 LAB — CREATININE, SERUM
Creatinine, Ser: 1.05 mg/dL — ABNORMAL HIGH (ref 0.44–1.00)
GFR, Estimated: >60 mL/min (ref >60)

## 2023-08-11 SURGERY — THYROIDECTOMY
Anesthesia: Choice

## 2023-08-11 MED ORDER — OXYCODONE HCL 5 MG PO TABS
10.0000 mg | ORAL_TABLET | Freq: Four times a day (QID) | ORAL | Status: DC | PRN
  Administered 2023-08-11 – 2023-08-12 (×2): 10 mg via ORAL
  Filled 2023-08-11 (×2): qty 2

## 2023-08-11 MED ORDER — GLYCOPYRROLATE PF 0.2 MG/ML IJ SOSY
PREFILLED_SYRINGE | INTRAMUSCULAR | Status: DC | PRN
Start: 2023-08-11 — End: 2023-08-11
  Administered 2023-08-11: .2 mg via INTRAVENOUS

## 2023-08-11 MED ORDER — ONDANSETRON HCL 4 MG/2ML IJ SOLN
INTRAMUSCULAR | Status: AC
  Filled 2023-08-11: qty 2

## 2023-08-11 MED ORDER — HYDROMORPHONE HCL 1 MG/ML IJ SOLN
INTRAMUSCULAR | Status: AC
  Filled 2023-08-11: qty 1

## 2023-08-11 MED ORDER — LIDOCAINE-EPINEPHRINE 1 %-1:100000 IJ SOLN
INTRAMUSCULAR | Status: DC | PRN
  Administered 2023-08-11: 6 mL

## 2023-08-11 MED ORDER — IRBESARTAN 150 MG PO TABS
150.0000 mg | ORAL_TABLET | Freq: Every day | ORAL | Status: DC
  Administered 2023-08-11 – 2023-08-12 (×2): 150 mg via ORAL
  Filled 2023-08-11 (×2): qty 1

## 2023-08-11 MED ORDER — LEVOTHYROXINE SODIUM 150 MCG PO TABS
150.0000 ug | ORAL_TABLET | Freq: Every day | ORAL | Status: DC
  Administered 2023-08-12: 150 ug via ORAL
  Filled 2023-08-11: qty 1
  Filled 2023-08-11: qty 2

## 2023-08-11 MED ORDER — GLYCOPYRROLATE PF 0.2 MG/ML IJ SOSY
PREFILLED_SYRINGE | INTRAMUSCULAR | Status: AC
  Filled 2023-08-11: qty 1

## 2023-08-11 MED ORDER — TELMISARTAN-HCTZ 40-12.5 MG PO TABS
1.0000 | ORAL_TABLET | Freq: Every day | ORAL | Status: DC
Start: 2023-08-11 — End: 2023-08-11

## 2023-08-11 MED ORDER — SODIUM CHLORIDE 0.9 % IV SOLN
3.0000 g | INTRAVENOUS | Status: DC
  Filled 2023-08-11: qty 8

## 2023-08-11 MED ORDER — ORAL CARE MOUTH RINSE: 15.0000 mL | Freq: Once | OROMUCOSAL | Status: AC

## 2023-08-11 MED ORDER — OXYCODONE HCL 5 MG/5ML PO SOLN: 5.0000 mg | Freq: Once | ORAL | Status: DC | PRN

## 2023-08-11 MED ORDER — PHENYLEPHRINE 80 MCG/ML (10ML) SYRINGE FOR IV PUSH (FOR BLOOD PRESSURE SUPPORT)
PREFILLED_SYRINGE | INTRAVENOUS | Status: AC
Start: 2023-08-11 — End: ?
  Filled 2023-08-11: qty 10

## 2023-08-11 MED ORDER — CEFAZOLIN SODIUM-DEXTROSE 2-4 GM/100ML-% IV SOLN
2.0000 g | Freq: Once | INTRAVENOUS | Status: AC
  Administered 2023-08-11 (×2): 2 g via INTRAVENOUS

## 2023-08-11 MED ORDER — HYDROMORPHONE HCL 1 MG/ML IJ SOLN
0.2500 mg | INTRAMUSCULAR | Status: DC | PRN
  Administered 2023-08-11 (×2): 0.5 mg via INTRAVENOUS

## 2023-08-11 MED ORDER — PHENYLEPHRINE HCL-NACL 20-0.9 MG/250ML-% IV SOLN
INTRAVENOUS | Status: AC
  Filled 2023-08-11: qty 250

## 2023-08-11 MED ORDER — PHENYLEPHRINE 80 MCG/ML (10ML) SYRINGE FOR IV PUSH (FOR BLOOD PRESSURE SUPPORT)
PREFILLED_SYRINGE | INTRAVENOUS | Status: DC | PRN
  Administered 2023-08-11 (×2): 160 ug via INTRAVENOUS

## 2023-08-11 MED ORDER — ACETAMINOPHEN 160 MG/5ML PO SOLN
650.0000 mg | ORAL | Status: DC | PRN
  Administered 2023-08-11: 650 mg via ORAL
  Filled 2023-08-11: qty 20.3

## 2023-08-11 MED ORDER — LIDOCAINE 2% (20 MG/ML) 5 ML SYRINGE
INTRAMUSCULAR | Status: AC
  Filled 2023-08-11: qty 10

## 2023-08-11 MED ORDER — ONDANSETRON HCL 4 MG/2ML IJ SOLN
INTRAMUSCULAR | Status: DC | PRN
  Administered 2023-08-11: 4 mg via INTRAVENOUS

## 2023-08-11 MED ORDER — FENTANYL CITRATE (PF) 250 MCG/5ML IJ SOLN
INTRAMUSCULAR | Status: DC | PRN
Start: 2023-08-11 — End: 2023-08-11
  Administered 2023-08-11: 100 ug via INTRAVENOUS

## 2023-08-11 MED ORDER — DEXAMETHASONE SODIUM PHOSPHATE 10 MG/ML IJ SOLN
INTRAMUSCULAR | Status: DC | PRN
  Administered 2023-08-11: 10 mg via INTRAVENOUS

## 2023-08-11 MED ORDER — ENOXAPARIN SODIUM 40 MG/0.4ML IJ SOSY
40.0000 mg | PREFILLED_SYRINGE | INTRAMUSCULAR | Status: DC
  Administered 2023-08-12: 40 mg via SUBCUTANEOUS
  Filled 2023-08-11: qty 0.4

## 2023-08-11 MED ORDER — LIDOCAINE 2% (20 MG/ML) 5 ML SYRINGE
INTRAMUSCULAR | Status: DC | PRN
  Administered 2023-08-11: 60 mg via INTRAVENOUS

## 2023-08-11 MED ORDER — PROPOFOL 10 MG/ML IV BOLUS
INTRAVENOUS | Status: AC
  Filled 2023-08-11: qty 20

## 2023-08-11 MED ORDER — KETAMINE HCL 50 MG/5ML IJ SOSY
PREFILLED_SYRINGE | INTRAMUSCULAR | Status: AC
  Filled 2023-08-11: qty 5

## 2023-08-11 MED ORDER — AMISULPRIDE (ANTIEMETIC) 5 MG/2ML IV SOLN: 10.0000 mg | Freq: Once | INTRAVENOUS | Status: DC | PRN

## 2023-08-11 MED ORDER — ALBUMIN HUMAN 5 % IV SOLN: INTRAVENOUS | Status: DC | PRN

## 2023-08-11 MED ORDER — CEFAZOLIN SODIUM-DEXTROSE 2-4 GM/100ML-% IV SOLN
INTRAVENOUS | Status: AC
Start: 2023-08-11 — End: 2023-08-11
  Filled 2023-08-11: qty 100

## 2023-08-11 MED ORDER — CHLORHEXIDINE GLUCONATE 0.12 % MT SOLN
OROMUCOSAL | Status: AC
  Administered 2023-08-11: 15 mL via OROMUCOSAL
  Filled 2023-08-11: qty 15

## 2023-08-11 MED ORDER — LACTATED RINGERS IV SOLN: INTRAVENOUS | Status: DC | PRN

## 2023-08-11 MED ORDER — CEFAZOLIN SODIUM-DEXTROSE 1-4 GM/50ML-% IV SOLN
1.0000 g | Freq: Three times a day (TID) | INTRAVENOUS | Status: DC
  Administered 2023-08-12: 1 g via INTRAVENOUS
  Filled 2023-08-11 (×2): qty 50

## 2023-08-11 MED ORDER — ACETAMINOPHEN 500 MG PO TABS
1000.0000 mg | ORAL_TABLET | Freq: Once | ORAL | Status: AC
  Administered 2023-08-11: 1000 mg via ORAL
  Filled 2023-08-11: qty 2

## 2023-08-11 MED ORDER — CALCIUM CARBONATE 1250 (500 CA) MG PO TABS
1250.0000 mg | ORAL_TABLET | Freq: Three times a day (TID) | ORAL | Status: DC
  Administered 2023-08-11 – 2023-08-12 (×2): 1250 mg via ORAL
  Filled 2023-08-11 (×2): qty 1

## 2023-08-11 MED ORDER — LIDOCAINE-EPINEPHRINE 1 %-1:100000 IJ SOLN
INTRAMUSCULAR | Status: AC
  Filled 2023-08-11: qty 1

## 2023-08-11 MED ORDER — PHENYLEPHRINE HCL-NACL 20-0.9 MG/250ML-% IV SOLN
INTRAVENOUS | Status: DC | PRN
  Administered 2023-08-11: 50 ug/min via INTRAVENOUS

## 2023-08-11 MED ORDER — EPINEPHRINE HCL (NASAL) 0.1 % NA SOLN
NASAL | Status: AC
  Filled 2023-08-11: qty 30

## 2023-08-11 MED ORDER — CEFAZOLIN SODIUM-DEXTROSE 1-4 GM/50ML-% IV SOLN: 1.0000 g | Freq: Once | INTRAVENOUS | Status: DC

## 2023-08-11 MED ORDER — PROPOFOL 10 MG/ML IV BOLUS
INTRAVENOUS | Status: DC | PRN
  Administered 2023-08-11 (×2): 50 mg via INTRAVENOUS
  Administered 2023-08-11: 40 mg via INTRAVENOUS
  Administered 2023-08-11: 50 mg via INTRAVENOUS

## 2023-08-11 MED ORDER — OXYCODONE HCL 5 MG PO TABS: 5.0000 mg | ORAL_TABLET | Freq: Once | ORAL | Status: DC | PRN

## 2023-08-11 MED ORDER — ONDANSETRON HCL 4 MG PO TABS: 4.0000 mg | ORAL_TABLET | ORAL | Status: DC | PRN

## 2023-08-11 MED ORDER — SENNA 8.6 MG PO TABS
1.0000 | ORAL_TABLET | Freq: Two times a day (BID) | ORAL | Status: DC
Start: 2023-08-11 — End: 2023-08-12
  Administered 2023-08-11 – 2023-08-12 (×2): 8.6 mg via ORAL
  Filled 2023-08-11 (×2): qty 1

## 2023-08-11 MED ORDER — KETAMINE HCL 50 MG/5ML IJ SOSY
PREFILLED_SYRINGE | INTRAMUSCULAR | Status: DC | PRN
  Administered 2023-08-11: 20 mg via INTRAVENOUS
  Administered 2023-08-11: 30 mg via INTRAVENOUS

## 2023-08-11 MED ORDER — SODIUM CHLORIDE 0.9 % IV SOLN
0.1500 ug/kg/min | Freq: Once | INTRAVENOUS | Status: DC
  Filled 2023-08-11: qty 2000

## 2023-08-11 MED ORDER — MIDAZOLAM HCL 2 MG/2ML IJ SOLN
INTRAMUSCULAR | Status: AC
  Filled 2023-08-11: qty 2

## 2023-08-11 MED ORDER — HYDROCHLOROTHIAZIDE 12.5 MG PO TABS
12.5000 mg | ORAL_TABLET | Freq: Every day | ORAL | Status: DC
  Administered 2023-08-11 – 2023-08-12 (×2): 12.5 mg via ORAL
  Filled 2023-08-11 (×2): qty 1

## 2023-08-11 MED ORDER — FENTANYL CITRATE (PF) 250 MCG/5ML IJ SOLN
INTRAMUSCULAR | Status: AC
  Filled 2023-08-11: qty 5

## 2023-08-11 MED ORDER — OXYCODONE HCL 5 MG PO TABS: 5.0000 mg | ORAL_TABLET | Freq: Four times a day (QID) | ORAL | Status: DC | PRN

## 2023-08-11 MED ORDER — OXYCODONE HCL 5 MG PO TABS
ORAL_TABLET | ORAL | Status: AC
  Filled 2023-08-11: qty 2

## 2023-08-11 MED ORDER — CEFAZOLIN SODIUM-DEXTROSE 1-4 GM/50ML-% IV SOLN
INTRAVENOUS | Status: AC
  Filled 2023-08-11: qty 50

## 2023-08-11 MED ORDER — REMIFENTANIL HCL 1 MG IV SOLR
INTRAVENOUS | Status: DC | PRN
  Administered 2023-08-11: .1 ug/kg/min via INTRAVENOUS

## 2023-08-11 MED ORDER — ACETAMINOPHEN 650 MG RE SUPP: 650.0000 mg | RECTAL | Status: DC | PRN

## 2023-08-11 MED ORDER — ONDANSETRON HCL 4 MG/2ML IJ SOLN: 4.0000 mg | INTRAMUSCULAR | Status: DC | PRN

## 2023-08-11 MED ORDER — ONDANSETRON HCL 4 MG/2ML IJ SOLN: 4.0000 mg | Freq: Once | INTRAMUSCULAR | Status: DC | PRN

## 2023-08-11 MED ORDER — MIDAZOLAM HCL 2 MG/2ML IJ SOLN
INTRAMUSCULAR | Status: DC | PRN
  Administered 2023-08-11: 2 mg via INTRAVENOUS

## 2023-08-11 MED ORDER — LACTATED RINGERS IV SOLN: INTRAVENOUS | Status: DC

## 2023-08-11 MED ORDER — CHLORHEXIDINE GLUCONATE 0.12 % MT SOLN: 15.0000 mL | Freq: Once | OROMUCOSAL | Status: AC

## 2023-08-11 MED ORDER — DEXMEDETOMIDINE HCL IN NACL 80 MCG/20ML IV SOLN
INTRAVENOUS | Status: DC | PRN
  Administered 2023-08-11 (×2): 4 ug via INTRAVENOUS
  Administered 2023-08-11: 12 ug via INTRAVENOUS

## 2023-08-11 SURGICAL SUPPLY — 62 items
ATTRACTOMAT 16X20 MAGNETIC DRP (DRAPES) ×2 IMPLANT
BAG COUNTER SPONGE SURGICOUNT (BAG) ×2 IMPLANT
BENZOIN TINCTURE PRP APPL 2/3 (GAUZE/BANDAGES/DRESSINGS) IMPLANT
BIOPATCH BLUE 3/4IN DISK W/1.5 (GAUZE/BANDAGES/DRESSINGS) IMPLANT
BLADE SURG 15 STRL LF DISP TIS (BLADE) ×2 IMPLANT
CANISTER SUCTION 3000ML PPV (SUCTIONS) ×2 IMPLANT
CLIP TI MEDIUM 24 (CLIP) ×2 IMPLANT
CLIP TI WIDE RED SMALL 24 (CLIP) ×2 IMPLANT
CNTNR URN SCR LID CUP LEK RST (MISCELLANEOUS) IMPLANT
CORD BIPOLAR FORCEPS 12FT (ELECTRODE) ×2 IMPLANT
COVER SURGICAL LIGHT HANDLE (MISCELLANEOUS) ×2 IMPLANT
DERMABOND ADVANCED .7 DNX12 (GAUZE/BANDAGES/DRESSINGS) IMPLANT
DRAIN 10X20 FULL PER LF SIL ST (DRAIN) IMPLANT
DRAIN CHANNEL 10M FLAT 3/4 FLT (DRAIN) IMPLANT
DRAIN JACKSON PRT FLT 7MM (DRAIN) IMPLANT
DRAIN JP 10F RND RADIO (DRAIN) IMPLANT
DRAPE HALF SHEET 40X57 (DRAPES) IMPLANT
DRAPE UTILITY XL STRL (DRAPES) IMPLANT
DRSG TEGADERM 4X4.75 (GAUZE/BANDAGES/DRESSINGS) ×4 IMPLANT
ELECT COATED BLADE 2.86 ST (ELECTRODE) ×2 IMPLANT
ELECTRODE REM PT RTRN 9FT ADLT (ELECTROSURGICAL) ×2 IMPLANT
EVACUATOR SILICONE 100CC (DRAIN) IMPLANT
FORCEPS BIPOLAR SPETZLER 8 1.0 (NEUROSURGERY SUPPLIES) ×2 IMPLANT
GAUZE 4X4 16PLY ~~LOC~~+RFID DBL (SPONGE) ×2 IMPLANT
GLOVE BIO SURGEON STRL SZ 6 (GLOVE) ×2 IMPLANT
GLOVE BIO SURGEON STRL SZ7.5 (GLOVE) ×2 IMPLANT
GOWN STRL REUS W/ TWL LRG LVL3 (GOWN DISPOSABLE) ×4 IMPLANT
HEMOSTAT SURGICEL 2X14 (HEMOSTASIS) IMPLANT
KIT BASIN OR (CUSTOM PROCEDURE TRAY) ×2 IMPLANT
KIT TURNOVER KIT B (KITS) ×2 IMPLANT
LOCATOR NERVE 3 VOLT (DISPOSABLE) IMPLANT
MARKER SKIN DUAL TIP RULER LAB (MISCELLANEOUS) ×2 IMPLANT
NDL HYPO 25GX1X1/2 BEV (NEEDLE) ×2 IMPLANT
NEEDLE HYPO 25GX1X1/2 BEV (NEEDLE) ×1 IMPLANT
NS IRRIG 1000ML POUR BTL (IV SOLUTION) ×2 IMPLANT
PAD ARMBOARD POSITIONER FOAM (MISCELLANEOUS) ×4 IMPLANT
PATTIES SURGICAL .5 X.5 (GAUZE/BANDAGES/DRESSINGS) IMPLANT
PENCIL BUTTON HOLSTER BLD 10FT (ELECTRODE) ×2 IMPLANT
POSITIONER HEAD DONUT 9IN (MISCELLANEOUS) IMPLANT
PROBE NERVBE PRASS .33 (MISCELLANEOUS) ×2 IMPLANT
RETRACTOR STAY HOOK 5MM (MISCELLANEOUS) IMPLANT
SET WALTER ACTIVATION W/DRAPE (SET/KITS/TRAYS/PACK) IMPLANT
SHEARS HARMONIC 9CM CVD (BLADE) ×2 IMPLANT
SPONGE INTESTINAL PEANUT (DISPOSABLE) ×2 IMPLANT
SPONGE T-LAP 18X18 ~~LOC~~+RFID (SPONGE) IMPLANT
STAPLER VISISTAT 35W (STAPLE) ×2 IMPLANT
STRIP CLOSURE SKIN 1/2X4 (GAUZE/BANDAGES/DRESSINGS) ×2 IMPLANT
STRIP CLOSURE SKIN 1/4X4 (GAUZE/BANDAGES/DRESSINGS) IMPLANT
SUT MNCRL AB 4-0 PS2 18 (SUTURE) ×2 IMPLANT
SUT MON AB 5-0 PS2 18 (SUTURE) IMPLANT
SUT SILK 2 0 SH (SUTURE) IMPLANT
SUT SILK 2 0 TIES 10X30 (SUTURE) ×2 IMPLANT
SUT SILK 2 0SH CR/8 30 (SUTURE) ×2 IMPLANT
SUT SILK 3 0 SH 30 (SUTURE) IMPLANT
SUT SILK 3 0 TIES 10X30 (SUTURE) ×2 IMPLANT
SUT SILK 3 0SH CR/8 30 (SUTURE) IMPLANT
SUT VIC AB 3-0 SH 27X BRD (SUTURE) IMPLANT
SUT VIC AB 4-0 PS2 18 (SUTURE) ×4 IMPLANT
TAPE STRIPS DRAPE STRL (GAUZE/BANDAGES/DRESSINGS) IMPLANT
TOWEL GREEN STERILE FF (TOWEL DISPOSABLE) ×2 IMPLANT
TRAY CATH INTERMITTENT SS 16FR (CATHETERS) IMPLANT
TRAY ENT MC OR (CUSTOM PROCEDURE TRAY) ×2 IMPLANT

## 2023-08-11 NOTE — Transfer of Care (Signed)
 Immediate Anesthesia Transfer of Care Note  Patient: Amber Reilly  Procedure(s) Performed: THYROIDECTOMY  Patient Location: PACU  Anesthesia Type:General  Level of Consciousness: sedated  Airway & Oxygen Therapy: Patient Spontanous Breathing and Patient connected to face mask oxygen  Post-op Assessment: Report given to RN and Post -op Vital signs reviewed and stable  Post vital signs: Reviewed and stable  Last Vitals:  Vitals Value Taken Time  BP 139/88 08/11/23 1625  Temp    Pulse 98 08/11/23 1628  Resp 23 08/11/23 1628  SpO2 99 % 08/11/23 1628  Vitals shown include unfiled device data.  Last Pain:  Vitals:   08/11/23 1022  TempSrc:   PainSc: 0-No pain         Complications:  Encounter Notable Events  Notable Event Outcome Phase Comment  Difficult to intubate - expected  Intraprocedure

## 2023-08-11 NOTE — Interval H&P Note (Signed)
 History and Physical Interval Note:  08/11/2023 11:14 AM  Amber Reilly  has presented today for surgery, with the diagnosis of Neck mass, Cyst of thyroid , Nontoxic multinodular goiter, Neck tightness.  The various methods of treatment have been discussed with the patient and family. After consideration of risks, benefits and other options for treatment, the patient has consented to  Procedure(s): THYROIDECTOMY (N/A) as a surgical intervention.  The patient's history has been reviewed, patient examined, no change in status, stable for surgery.  I have reviewed the patient's chart and labs.  Questions were answered to the patient's satisfaction.     Sendy Pluta

## 2023-08-11 NOTE — Anesthesia Procedure Notes (Addendum)
 Procedure Name: Intubation Date/Time: 08/11/2023 11:45 AM  Performed by: Salnikova, Evgenia A, CRNAPre-anesthesia Checklist: Patient identified, Emergency Drugs available, Suction available and Patient being monitored Patient Re-evaluated:Patient Re-evaluated prior to induction Oxygen Delivery Method: Circle System Utilized Preoxygenation: Pre-oxygenation with 100% oxygen Induction Type: IV induction Ventilation: Mask ventilation without difficulty Laryngoscope Size: Glidescope and 4 Grade View: Grade II Tube type: Oral Tube size: 7.0 mm Number of attempts: 1 Airway Equipment and Method: Stylet and Oral airway Placement Confirmation: ETT inserted through vocal cords under direct vision, positive ETCO2 and breath sounds checked- equal and bilateral Secured at: 22 cm Tube secured with: Tape Dental Injury: Teeth and Oropharynx as per pre-operative assessment  Difficulty Due To: Difficulty was anticipated, Difficult Airway- due to immobile epiglottis and Difficult Airway- due to large tongue Comments: Large thyroid  goiter at the time of intubation, complicating airway. Mall3 on exam, large tongue.

## 2023-08-11 NOTE — Op Note (Signed)
 OPERATIVE REPORT  REOPERATIVE DIAGNOSIS: 1. Large substernal multinodular goiter 2. Compressive symptoms including neck tightness, dysphagia   POSTOPERATIVE DIAGNOSIS: 1. Large substernal multinodular goiter 2. Compressive symptoms including neck tightness, dysphagia  PROCEDURE: 1. Total thyroidectomy  2. Recurrent laryngeal nerve monitoring, 4 hours  SURGEON: Artice Last, MD  ASSISTANT: Hortense Lyons, RN first assist   ANESTHESIA: General endotracheal   ESTIMATED BLOOD LOSS: less than 15 mL  SPECIMEN:  Total thyroid  gland, stitch marks right superior pole Additional right thyroid  lobe tissue   COMPLICATIONS: None  DISPOSITION: Stable to PACU  INTRAOPERATIVE FINDINGS: 1. Large substernal multinodular goiter extending from just behind the sternum to level 2 in the neck  3. No lymphadenopathy in level VI/central compartment 4. Bilateral recurrent laryngeal nerves identified/intact at the end of the case  INDICATIONS AND CONSENT: 5 yoF presented to the Otolaryngology clinic with multinodular goiter and compressive symptoms. It was therefore recommended to undergo a total thyroidectomy. After the risks, benefits, indications and alternatives were discussed, they elected to proceed. Risks discussed include but are not limited to damage to the larynx, trachea and esophagus, changes in voice and swallow, hypocalcemia, hypothyroidism, need for hormone or vitamin supplementation, scarring or cosmetic change, damage to the recurrent and superior laryngeal nerves, pneumothorax, chyle leak, and need for further treatment or tracheostomy. Informed consent was obtained.  DESCRIPTION OF PROCEDURE: On 08/11/2023, the patient was brought into the operating room and transferred to the operating room table in a supine position. General anesthesia was induced per Anesthesia protocol and the patient intubated using the 7.0 NIMS monitoring endotracheal tube. A GlideScope was used to confirm  appropriate placement of the endotracheal tube electrodes. A shoulder roll was then placed for gentle cervical extension. Nerve monitor was verified to be functioning, and continuous nerve monitoring was used throughout the case, a total of 3 hours. A 6 cm incision was marked out in one of the patient's natural transverse skin creases overlying the thyroid  gland. This was infiltrated with 1% lidocaine  with 1:100,000 parts epinephrine for local anesthetic and vasoconstrictive effects. The patient was then prepped and draped in the usual sterile fashion. A surgeon initiated time out was performed to ensure proper patient and procedure.   A 15 blade scalpel was used to incise the skin, subcutaneous tissue, and platysma muscle. Subplatysmal flaps were then raised circumferentially and self-retaining retractors were placed. The midline cervical raphe was identified and divided in the midline. Of note the thyroid  gland was very large, and additional time (1 hour) was spent dissecting the entire thyroid  gland with added level of complexity during this case.   The strap muscles were first dissected off the thyroid  gland on the right side. The right thyroid  lobe was then reflected inferiorly and the superior pole vessels were identified and ligated next to the capsule to preserve the external branch of the superior laryngeal nerve. The gland was then rotated medially. The superior parathyroid gland was identified and this was preserved in situ with blood supply intact. The recurrent laryngeal nerve was then identified coursing towards the cricothyroid joint. The recurrent laryngeal nerve was followed superiorly and inferiorly for short distance and the thyroid  gland mobilized away from the nerve which was preserved. The inferior aspect of the gland was then addressed. The inferior pole vessels were identified and ligated next to the capsule.  The inferior parathyroid was also identified and preserved in situ.  The  right recurrent laryngeal nerve stimulated and was anatomically intact, so we then turned our  attention to the contralateral thyroid  lobe on the left side. The same procedure was carried out on the left side. In a similar fashion, the left recurrent laryngeal nerve was identified and preserved. Both inferior and superior left parathyroid glands were identified and preserved in situ with their blood supply. The entire thyroid  gland was then also released from the anterior wall of the trachea and Berry's ligament and taken out. The gland was inspected to ensure no evidence of retained parathyroid glands, and sent off for permanent pathology. We then inspected the wound for any abnormal lymph nodes and discovered a large remnant of the right thyroid  lobe which was lateral and superior, and appeared to have been detached from the main specimen during the dissection. It was then carefully dissected and removed.  Inspection of the neck revealed no residual suspicious masses or lymph nodes. We then identified both recurrent laryngeal nerves and they were stimulating with good signal. Hemostasis was achieved with careful cautery away from the recurrent laryngeal nerve. At this point, we proceeded with closure of the wound. The wound was thoroughly irrigated, and hemostasis was obtained. Bilateral recurrent laryngeal nerves were once again identified and stimulated and appropriate function was confirmed using the nerve stimulator. A closed-suction drain was then placed into the deep aspect of the wound and placed on self-suction. The strap muscles were then reapproximated in the midline. The wound was closed in a layered fashion using absorbable suture for the deep dermal layers and Monocryl subcutaneous suture for the skin. Steri-strips were applied. Surgical specimens were sent for permanent histopathology.  The RN First Assist (RNFA) assisted throughout the case. The RNFA was essential in retraction and counter traction  when needed to make the case progress smoothly. This retraction and assistance made it possible to see the tissue planes for the procedure. The assistance was needed for tissue re-approximation and assisted with closure of the incision site   This concluded our procedure. The patient tolerated the procedure well without any apparent immediate post operative complications. All counts were correct x2. The patient was gently awakened from general anesthesia and taken to the PACU in stable condition.

## 2023-08-11 NOTE — Anesthesia Postprocedure Evaluation (Signed)
 Anesthesia Post Note  Patient: Amber Reilly  Procedure(s) Performed: THYROIDECTOMY     Patient location during evaluation: PACU Anesthesia Type: General Level of consciousness: awake and alert, patient cooperative and oriented Pain control: pain improving. Vital Signs Assessment: post-procedure vital signs reviewed and stable Respiratory status: spontaneous breathing, nonlabored ventilation, respiratory function stable and patient connected to nasal cannula oxygen Cardiovascular status: blood pressure returned to baseline and stable Postop Assessment: no apparent nausea or vomiting Anesthetic complications: yes   Encounter Notable Events  Notable Event Outcome Phase Comment  Difficult to intubate - expected  Intraprocedure     Last Vitals:  Vitals:   08/11/23 1715 08/11/23 1730  BP: 125/78 120/85  Pulse: 83 84  Resp: 15 14  Temp: 36.9 C 36.9 C  SpO2: 92% 92%    Last Pain:  Vitals:   08/11/23 1734  TempSrc:   PainSc: 9                  Amber Reilly,E. Hussein Macdougal

## 2023-08-12 ENCOUNTER — Other Ambulatory Visit (INDEPENDENT_AMBULATORY_CARE_PROVIDER_SITE_OTHER): Payer: Self-pay | Admitting: Otolaryngology

## 2023-08-12 ENCOUNTER — Telehealth (INDEPENDENT_AMBULATORY_CARE_PROVIDER_SITE_OTHER): Payer: Self-pay

## 2023-08-12 ENCOUNTER — Telehealth (INDEPENDENT_AMBULATORY_CARE_PROVIDER_SITE_OTHER): Payer: Self-pay | Admitting: Otolaryngology

## 2023-08-12 ENCOUNTER — Encounter (HOSPITAL_COMMUNITY): Payer: Self-pay | Admitting: Otolaryngology

## 2023-08-12 DIAGNOSIS — E042 Nontoxic multinodular goiter: Secondary | ICD-10-CM | POA: Diagnosis not present

## 2023-08-12 LAB — CALCIUM: Calcium: 8.2 mg/dL — ABNORMAL LOW (ref 8.9–10.3)

## 2023-08-12 MED ORDER — ACETAMINOPHEN 500 MG PO TABS: 500.0000 mg | ORAL_TABLET | Freq: Four times a day (QID) | ORAL | 0 refills | Status: AC

## 2023-08-12 MED ORDER — IBUPROFEN 600 MG PO TABS: 600.0000 mg | ORAL_TABLET | Freq: Four times a day (QID) | ORAL | 0 refills | Status: DC

## 2023-08-12 MED ORDER — SENNA 8.6 MG PO TABS: 1.0000 | ORAL_TABLET | Freq: Two times a day (BID) | ORAL | 0 refills | Status: DC

## 2023-08-12 MED ORDER — AMOXICILLIN-POT CLAVULANATE 500-125 MG PO TABS: 1.0000 | ORAL_TABLET | Freq: Two times a day (BID) | ORAL | 0 refills | Status: AC

## 2023-08-12 MED ORDER — CALCIUM CARBONATE 1250 (500 CA) MG PO TABS: 1250.0000 mg | ORAL_TABLET | Freq: Three times a day (TID) | ORAL | 1 refills | Status: DC

## 2023-08-12 MED ORDER — ONDANSETRON HCL 4 MG PO TABS: 4.0000 mg | ORAL_TABLET | ORAL | 0 refills | Status: DC | PRN

## 2023-08-12 MED ORDER — FAMOTIDINE 20 MG PO TABS: 20.0000 mg | ORAL_TABLET | Freq: Two times a day (BID) | ORAL | 1 refills | Status: DC

## 2023-08-12 MED ORDER — OXYCODONE HCL 5 MG PO TABS: 5.0000 mg | ORAL_TABLET | Freq: Four times a day (QID) | ORAL | 0 refills | Status: DC | PRN

## 2023-08-12 MED ORDER — LEVOTHYROXINE SODIUM 150 MCG PO TABS: 150.0000 ug | ORAL_TABLET | Freq: Every day | ORAL | 2 refills | Status: DC

## 2023-08-12 NOTE — Telephone Encounter (Signed)
 LVM to confirm correct address/NM

## 2023-08-12 NOTE — Progress Notes (Addendum)
 ENT PROGRESS NOTE   Subjective: Patient seen and examined at bedside. No issues overnight. POD#1 s/p total thyroidectomy for a large substernal thyroid  goiter. Voice appears strong, had some indigestion after taking Synthroid this am, having bowel sounds, but no BM yet. Voiding. Pain level is low, did not require narcotic pain medication. No numbness or tingling, no N/V, dyspnea, no neck swelling.   Objective:  Physical Exam: BP (!) 130/52 (BP Location: Right Arm)   Pulse 93   Temp 98.4 F (36.9 C) (Oral)   Resp 16   Ht 5\' 4"  (1.626 m)   Wt 108.9 kg   LMP 07/29/2017   SpO2 100%   BMI 41.20 kg/m   JP output 65 ml/24 hrs   Exam: General: Well-developed, well-nourished Respiratory Respiratory effort: Equal inspiration and expiration without stridor Cardiovascular Peripheral Vascular: Warm extremities with equal color/perfusion Eyes: No nystagmus with equal extraocular motion bilaterally Neuro/Psych/Balance: Patient oriented to person, place, and time; Appropriate mood and affect; Gait is intact with no imbalance; Cranial nerves I-XII are intact Head and Face Inspection: Normocephalic and atraumatic without mass or lesion Facial Strength: Facial motility symmetric and full bilaterally ENT Pinna: External ear intact and fully developed External Nose: No scar or anatomic deformity Lips, Teeth, and gums: Mucosa and teeth intact and viable Oral cavity/oropharynx: No erythema or exudate, no lesions present Neck Neck and Trachea: Midline trachea without mass or lesion. Anterior neck incision C/D/I JP in place with serosanguinous output steri strips in place, no neck swelling Lymphatics: No lymphadenopathy   Recent Labs    08/11/23 1816  CREATININE 1.05*  CALCIUM 8.5*   Ca this am 8.2    Assessment/Plan: Large substernal goiter, s/p total thyroidectomy doing well. PACU Ca 8.5, am Ca 8.2. JP output too high to pull, wants to go home, will d/c today with JP drain. Friday f/u  with me to remove the drain.  - home on Synthroid, Ca Carbonate, abx, reflux medication, analgesia - RTC to clinic this Friday for drain removal (08/14/23)    LOS: 0 days    Artice Last, MD 08/12/2023, 8:14 AM

## 2023-08-12 NOTE — Plan of Care (Signed)
  Problem: Nutrition: Goal: Adequate nutrition will be maintained Outcome: Progressing   Problem: Activity: Goal: Risk for activity intolerance will decrease Outcome: Progressing   Problem: Coping: Goal: Level of anxiety will decrease Outcome: Progressing   Problem: Pain Managment: Goal: General experience of comfort will improve and/or be controlled Outcome: Progressing

## 2023-08-12 NOTE — Discharge Summary (Addendum)
 Physician Discharge Summary  Patient ID: Amber Reilly MRN: [id number] DOB/AGE: Apr 29, 1965 58 y.o.  Admit date: 08/11/2023 Discharge date: 08/12/2023  Admission Diagnoses: substernal thyroid  goiter  Discharge Diagnoses:  Principal Problem:   Substernal thyroid  goiter   Discharged Condition: good  Hospital Course:  2 yoF with hx of a large substernal goiter and compressive symptoms, who was admitted following total thyroidectomy for observation. She did well overnight, had strong voice and denied numbness and tingling. She was started on Synthroid and Calcium supplementation. Her pain was well-controlled. She had no N/V, dyspnea, neck swelling, fever, shortness of breath. She was discharged home with follow-up in the clinic in 2 days for drain removal. She was instructed to record drain outputs at home.   Consults: None  Discharge Exam: Blood pressure (!) 130/52, pulse 93, temperature 98.4 F (36.9 C), temperature source Oral, resp. rate 16, height 5\' 4"  (1.626 m), weight 108.9 kg, last menstrual period 07/29/2017, SpO2 100%. JP output 65 ml/24 hrs    Exam: General: Well-developed, well-nourished Respiratory Respiratory effort: Equal inspiration and expiration without stridor Cardiovascular Peripheral Vascular: Warm extremities with equal color/perfusion Eyes: No nystagmus with equal extraocular motion bilaterally Neuro/Psych/Balance: Patient oriented to person, place, and time; Appropriate mood and affect; Gait is intact with no imbalance; Cranial nerves I-XII are intact Head and Face Inspection: Normocephalic and atraumatic without mass or lesion Facial Strength: Facial motility symmetric and full bilaterally ENT Pinna: External ear intact and fully developed External Nose: No scar or anatomic deformity Lips, Teeth, and gums: Mucosa and teeth intact and viable Oral cavity/oropharynx: No erythema or exudate, no lesions present Neck Neck and Trachea: Midline trachea  without mass or lesion. Anterior neck incision C/D/I JP in place with serosanguinous output steri strips in place, no neck swelling Lymphatics: No lymphadenopathy  Disposition: Discharge disposition: 01-Home or Self Care     home   Discharge Instructions     Diet - low sodium heart healthy   Complete by: As directed    Increase activity slowly   Complete by: As directed       Allergies as of 08/12/2023       Reactions   Tramadol  Other (See Comments)   Bad headache   Latex Swelling   Swelling at the site of contact only   Morphine And Codeine    Patient does not like the way it makes her feel         Medication List     STOP taking these medications    phentermine  37.5 MG tablet Commonly known as: ADIPEX-P        TAKE these medications    acetaminophen  500 MG tablet Commonly known as: TYLENOL  Take 1 tablet (500 mg total) by mouth every 6 (six) hours. Take every 6 hrs x 2-3 days then take every 6 hrs as needed   amoxicillin-clavulanate 500-125 MG tablet Commonly known as: Augmentin Take 1 tablet by mouth 2 (two) times daily for 5 days.   calcium carbonate 1250 (500 Ca) MG tablet Commonly known as: OS-CAL - dosed in mg of elemental calcium Take 1 tablet (1,250 mg total) by mouth 3 (three) times daily.   famotidine 20 MG tablet Commonly known as: PEPCID Take 1 tablet (20 mg total) by mouth 2 (two) times daily.   ibuprofen  600 MG tablet Commonly known as: ADVIL  Take 1 tablet (600 mg total) by mouth every 6 (six) hours. Please take every 6 hrs, and stagger this medication 3 hrs apart from Tylenol   levothyroxine 150 MCG tablet Commonly known as: SYNTHROID Take 1 tablet (150 mcg total) by mouth daily at 6 (six) AM. Start taking on: Aug 13, 2023   MULTIVITAMIN WOMEN 50+ PO Take 1 tablet by mouth daily.   ondansetron  4 MG tablet Commonly known as: ZOFRAN  Take 1 tablet (4 mg total) by mouth every 4 (four) hours as needed for nausea.   oxyCODONE  5 MG  immediate release tablet Commonly known as: Oxy IR/ROXICODONE  Take 1 tablet (5 mg total) by mouth every 6 (six) hours as needed for moderate pain (pain score 4-6).   senna 8.6 MG Tabs tablet Commonly known as: SENOKOT Take 1 tablet (8.6 mg total) by mouth 2 (two) times daily.   telmisartan -hydrochlorothiazide  40-12.5 MG tablet Commonly known as: MICARDIS  HCT TAKE 1 TABLET BY MOUTH DAILY. ANNUAL APPT IS DUE MUST SEE PROVIDER FOR FUTURE REFILLS   triamcinolone  cream 0.1 % Commonly known as: KENALOG  APPLY 1 APPLICATION TOPICALLY 4 TIMES A DAY   Vitamin D3 25 MCG (1000 UT) Caps Take 1 capsule (1,000 Units total) by mouth daily.         Signed: Rebecca Cairns 08/12/2023, 8:38 AM

## 2023-08-12 NOTE — Discharge Instructions (Signed)
 This post-operative instruction sheet is designed to help you after surgery and help answer any of the common questions you may have. It is not entirely comprehensive, so if you have any questions, do not hesitate to call the office 24 hours a day.  What to expect: - You will see swelling or bruising under the incision in a few days. This is usually greatest on the second or third day after the operation. You also may feel the sensation of swelling and/or firmness which can last for a month or more. - Most patients experience very little pain from the incision and may complain more about a sore throat from the breathing tube. You may experience neck stiffness or soreness in your shoulders, back or neck. - Some patients also notice minor changes in swallowing, which improve over time. - The skin just above and below your incision will feel numb. This will improve over several months though some patients may have a permanent decrease in sensation in this area. - Your voice may be slightly hoarse or weak after surgery. This is normal and does not mean there was damage to the nerves that make the vocal cords move. The breathing tube used during surgery often irritates the vocal cords. Your voice usually will go back to normal within several days to a few weeks.   Wound Care and Hygiene:  Hygiene - You may shower/bathe 24-48 hours after surgery.  Do not let the water from the shower stream hit the incision directly. Do not soak incision until you are seen in follow up.  Incision care: - A special skin glue called Dermabond with Steri-strips is used to close and cover your incision. It is to stay in place for 10 to 14 days and wears off on its own. The glue is waterproof but you should not soak your incision or take a tub bath for 2 weeks because it could loosen the glue too soon. Do not pick or peel the glue off your skin. You should remove your post-operative dressing the day after your surgery. - You do  not need to cover the incision with saran wrap to shower since the dressing is waterproof. - Do not apply ointments or powders to the incision. - No further care is needed unless you have a problem. - The incision will be raised and red, but will eventually fade over the course of a year. - All incisions are sensitive to sunlight. For one year after surgery, you should use sunscreen when outdoors for long periods to prevent darkening of the scar area.  Nutrition after Surgery: - Start your regular diet today. No dietary restrictions after this  Activity: - Limit your activity for 24 hours. - Avoid vigorous physical activity while the stitches are in place - this includes heavy lifting, running, and other sporting activities for at least 2 weeks. Avoid activities that pull or stretch on the area with stitches. - Sleep with head or surgery site elevated using several pillows when possible.   Medications: - Pain Medication - A prescription for pain medicine Oxycodone  will be sent home with you. Do not drive while taking prescription pain medicine (narcotic pain medication) and only take it if you need it. Eat when taking pain medicines to avoid nausea. Watch for constipation. Eat plenty of fruits, vegetables, juices, and drink 6 to 8 glasses of water each day. - Take scheduled Tylenol  and Motrin  every 6  hrs staggered 3 hrs apart from each other. Follow the dose  as label directs. If you still having significant pain, take Oxycodone .  - Take an over the counter stool softeners twice daily while taking the narcotic pain medicine to prevent constipation. - Take a stool softener twice a day as long as you remain on narcotic pain medicine. If you do not have a bowel movement within 5 days of your surgery, please take milk of magnesia. If you do not have a bowel movement within 12 hours of milk of magnesia, call the office.   - Levothyroxine (Synthroid) - This is a thyroid  hormone replacement. This  medicine can improve symptoms of thyroid  deficiency - Take this medicine by mouth with plenty of water. It is best to take on an empty stomach, at least 30 minutes before or 2 hours after food. Follow the directions on the prescription label. Take at the same time each day. Do not take your medicine more often than directed. - You will need regular exams and occasional blood tests to check the response to treatment. This will be done by your Endocrinologist  - You should take your thyroid  hormone medication on an empty stomach and by itself. Avoid taking calcium or any other medication within an hour of taking your thyroid  hormone pill. - A blood test will be done in 6-8 weeks to ensure the amount prescribed is correct. If you have thyroid  cancer, you may be placed on liothyronine (Cytomel) instead of levothyroxine. - Calcium Supplement: - Your body's calcium level may decrease after having a total thyroidectomy. We recommend you take calcium carbonate supplement as prescribed.   If you have nausea, take Zofran , an anti-nausea medication which we prescribed for you You were also given reflux medication to take for indigestion and heartburn.  You can resume all of your home medications  Anesthesia Precautions & Expectations: - After anesthesia, rest for 24 hours.  Do not drive, drink alcoholic beverages or make any important decisions during this time.   - General anesthesia may cause a sore throat, jaw discomfort or muscle aches.  These symptoms can last for one or two days.  Call Dr Adelia Adolphus office if you experience any of the following: - chest pain or shortness of breath - persistent fever of 100.5  - nausea/vomiting - neck swelling or noisy breathing  - numbness of tingling in your fingertips or around your lips  - significant redness or purulent drainage abound the neck incision

## 2023-08-12 NOTE — Telephone Encounter (Signed)
 LVM for patient to call back and schedule post-op appointment with Dr. Soldatova on Friday, May16 to remove her drain.

## 2023-08-13 LAB — SURGICAL PATHOLOGY

## 2023-08-14 ENCOUNTER — Ambulatory Visit (INDEPENDENT_AMBULATORY_CARE_PROVIDER_SITE_OTHER): Admitting: Otolaryngology

## 2023-08-14 ENCOUNTER — Encounter (INDEPENDENT_AMBULATORY_CARE_PROVIDER_SITE_OTHER): Payer: Self-pay | Admitting: Otolaryngology

## 2023-08-14 VITALS — BP 130/84 | HR 98

## 2023-08-14 DIAGNOSIS — Z9889 Other specified postprocedural states: Secondary | ICD-10-CM

## 2023-08-14 DIAGNOSIS — E042 Nontoxic multinodular goiter: Secondary | ICD-10-CM

## 2023-08-14 NOTE — Progress Notes (Signed)
 ENT Progress Note  Discussed the use of AI scribe software for clinical note transcription with the patient, who gave verbal consent to proceed.  History of Present Illness Amber Reilly is a 58 year old female with multinodular goiter who presents for follow-up after thyroid  surgery. POD#3 s/p total thyroidectomy for a large multinodular goiter. Here for drain removal.   She has been experiencing a persistent cough with phlegm production since the surgery. No neck swelling, significant pain, numbness, or tingling. Denies significant voice changes.   She has a history of acid reflux, which she managed with Pepto-Bismol last night due to experiencing some acid reflux symptoms. She is currently taking calcium supplementation and Synthroid as part of her post-surgical care.  The drain output in the last 24 hours was approximately 20 mL.   For pain management, she is taking Tylenol  and finds it bearable. She only took antibiotics for one day thus far post-surgery.  Physical exam: BP 130/84   Pulse 98   LMP 07/29/2017   SpO2 92%  Neck: flat, supple, no erythema, JP drain removed.  Negative Chvostek sign No stridor   Surgical pathology 08/11/23 FINAL MICROSCOPIC DIAGNOSIS:   A. THYROID , TOTAL, THYROIDECTOMY:  - Thyroid  follicular nodular disease (multinodular goiter).  - Changes consistent with prior FNA.  - Negative for malignancy.   B. THYROID , RIGHT ADDITIONAL, EXCISION:  - Thyroid  follicular nodular disease (multinodular goiter).  - Negative for malignancy.   A/p S/p total thyroidectomy, final pathology c/w multinodular goiter  Recovering as expected. On Synthroid and Ca Carbonate.   - continue Synthroid and Ca Carbonate - make an appt with PCP for TSH Ca labs and Synthroid dose adjustment - we also discussed that she will likely discontinue Ca Carbonate in a couple of weeks  - RTC as scheduled on 08/27/23 - wound care and post-op restrictions discussed

## 2023-08-26 ENCOUNTER — Telehealth (INDEPENDENT_AMBULATORY_CARE_PROVIDER_SITE_OTHER): Payer: Self-pay | Admitting: Otolaryngology

## 2023-08-26 NOTE — Telephone Encounter (Signed)
 Confirmed appt & location [id number]

## 2023-08-27 ENCOUNTER — Other Ambulatory Visit (INDEPENDENT_AMBULATORY_CARE_PROVIDER_SITE_OTHER): Payer: Self-pay | Admitting: Otolaryngology

## 2023-08-27 ENCOUNTER — Telehealth: Payer: Self-pay | Admitting: Internal Medicine

## 2023-08-27 ENCOUNTER — Ambulatory Visit (INDEPENDENT_AMBULATORY_CARE_PROVIDER_SITE_OTHER): Admitting: Otolaryngology

## 2023-08-27 VITALS — BP 112/58 | HR 80

## 2023-08-27 DIAGNOSIS — J343 Hypertrophy of nasal turbinates: Secondary | ICD-10-CM

## 2023-08-27 DIAGNOSIS — R0982 Postnasal drip: Secondary | ICD-10-CM

## 2023-08-27 DIAGNOSIS — K219 Gastro-esophageal reflux disease without esophagitis: Secondary | ICD-10-CM

## 2023-08-27 DIAGNOSIS — Z9889 Other specified postprocedural states: Secondary | ICD-10-CM

## 2023-08-27 DIAGNOSIS — J342 Deviated nasal septum: Secondary | ICD-10-CM

## 2023-08-27 DIAGNOSIS — J3089 Other allergic rhinitis: Secondary | ICD-10-CM

## 2023-08-27 DIAGNOSIS — R0981 Nasal congestion: Secondary | ICD-10-CM

## 2023-08-27 MED ORDER — FLUTICASONE PROPIONATE 50 MCG/ACT NA SUSP: 2.0000 | Freq: Every day | NASAL | 6 refills | Status: AC

## 2023-08-27 MED ORDER — LEVOCETIRIZINE DIHYDROCHLORIDE 5 MG PO TABS: 5.0000 mg | ORAL_TABLET | Freq: Every evening | ORAL | 3 refills | Status: DC

## 2023-08-27 MED ORDER — FAMOTIDINE 20 MG PO TABS: 20.0000 mg | ORAL_TABLET | Freq: Two times a day (BID) | ORAL | 11 refills | Status: DC

## 2023-08-27 NOTE — Progress Notes (Signed)
 ENT Progress Note  Discussed the use of AI scribe software for clinical note transcription with the patient, who gave verbal consent to proceed.  Update 08/27/2023   Amber Reilly is a 58 year old female who presents for f/u after thyroid  surgery 2 weeks ago.   She reports her voice is strong but she has mild intermittent hoarseness. Hx of GERD.   She has not been on Pepcid  post-surgery but has been using over-the-counter options. She has some nasal congestion or postnasal drainage.  No numbness or tingling sensations are reported. She was previously on calcium  supplements post-surgery, which she has now stopped. Her surgical site is healing, and she has been applying cocoa butter to the incision.   Records Reviewed:  History of Present Illness Amber Reilly is a 58 year old female with multinodular goiter who presents for follow-up after thyroid  surgery. POD#3 s/p total thyroidectomy for a large multinodular goiter. Here for drain removal.   She has been experiencing a persistent cough with phlegm production since the surgery. No neck swelling, significant pain, numbness, or tingling. Denies significant voice changes.   She has a history of acid reflux, which she managed with Pepto-Bismol last night due to experiencing some acid reflux symptoms. She is currently taking calcium  supplementation and Synthroid  as part of her post-surgical care.  The drain output in the last 24 hours was approximately 20 mL.   For pain management, she is taking Tylenol  and finds it bearable. She only took antibiotics for one day thus far post-surgery.    Physical exam: BP (!) 112/58   Pulse 80   LMP 07/29/2017   SpO2 96%  Neck: flat, supple, no erythema Negative Ch[place]ek sign No stridor voice is strong  Neck with incision c/d/I no crusting  Ears: EACs and TM clear b/l  Surgical pathology 08/11/23 FINAL MICROSCOPIC DIAGNOSIS:   A. THYROID , TOTAL, THYROIDECTOMY:  - Thyroid  follicular nodular  disease (multinodular goiter).  - Changes consistent with prior FNA.  - Negative for malignancy.   B. THYROID , RIGHT ADDITIONAL, EXCISION:  - Thyroid  follicular nodular disease (multinodular goiter).  - Negative for malignancy.   A/p S/p total thyroidectomy, final pathology c/w multinodular goiter  Recovering as expected. On Synthroid . Stopped Ca Carb.  - continue Synthroid   - make an appt with PCP for TSH and thyroid  labs labs and Synthroid  dose adjustment   GERD LPR Likely contributing to mild intermittent hoarseness  - Pepcid  20 mg BID  -  Reflux Gourmet after meals - diet and lifestyle changes to minimize GERD - Refer to [employer] blog for dietary and lifestyle modifications/reflux cook book  Nasal congestion and post-nasal drainage Likely contributing to intermittent mild hoarseness  - trial of Xyzal 5 mg mg daily and Flonase 2 puffs b/l nares BID - consider nasal saline rinses

## 2023-08-27 NOTE — Telephone Encounter (Unsigned)
 Copied from CRM [id number]. Topic: Clinical - Request for Lab/Test Order >> Aug 27, 2023  9:37 AM Adonis Hoot wrote: Reason for CRM: Patients had thyroid  removal surgery and her ENT doctor would like for her to have her thyroid  TSH levels checked to make sure that her medication is the correct dosage for her thyroid . She would like  to know if lab orders could be placed?

## 2023-08-27 NOTE — Patient Instructions (Signed)
-   Take Reflux Gourmet (natural supplement available on [employer]) to help with symptoms of chronic throat irritation      GamingLesson.nl - check out this website to learn more about reflux   -Avoid lying down for at least two hours after a meal or after drinking acidic beverages, like soda, or other caffeinated beverages. This can help to prevent stomach contents from flowing back into the esophagus. -Keep your head elevated while you sleep. Using an extra pillow or two can also help to prevent reflux. -Eat smaller and more frequent meals each day instead of a few large meals. This promotes digestion and can aid in preventing heartburn. -Wear loose-fitting clothes to ease pressure on the stomach, which can worsen heartburn and reflux. -Reduce excess weight around the midsection. This can ease pressure on the stomach. Such pressure can force some stomach contents back up the esophagus

## 2023-08-28 ENCOUNTER — Other Ambulatory Visit: Payer: Self-pay

## 2023-08-28 DIAGNOSIS — Z9089 Acquired absence of other organs: Secondary | ICD-10-CM

## 2023-08-28 DIAGNOSIS — Z79899 Other long term (current) drug therapy: Secondary | ICD-10-CM

## 2023-08-28 NOTE — Telephone Encounter (Signed)
 Please order TSH, free T4, free T3.  Thank you

## 2023-08-28 NOTE — Telephone Encounter (Signed)
 Called and spoke with patient. Informed her that the lab orders have been placed and we will contact her as soon as we have her results back

## 2023-09-01 ENCOUNTER — Other Ambulatory Visit (INDEPENDENT_AMBULATORY_CARE_PROVIDER_SITE_OTHER)

## 2023-09-01 DIAGNOSIS — Z9089 Acquired absence of other organs: Secondary | ICD-10-CM | POA: Diagnosis not present

## 2023-09-01 DIAGNOSIS — Z79899 Other long term (current) drug therapy: Secondary | ICD-10-CM | POA: Diagnosis not present

## 2023-09-01 DIAGNOSIS — Z9889 Other specified postprocedural states: Secondary | ICD-10-CM | POA: Diagnosis not present

## 2023-09-01 LAB — TSH: TSH: 0.25 u[IU]/mL — ABNORMAL LOW (ref 0.35–5.50)

## 2023-09-02 ENCOUNTER — Ambulatory Visit: Payer: Self-pay | Admitting: Internal Medicine

## 2023-09-02 LAB — T4: T4, Total: 11 ug/dL (ref 5.1–11.9)

## 2023-09-02 LAB — T3: T3, Total: 110 ng/dL (ref 76–181)

## 2023-09-23 ENCOUNTER — Other Ambulatory Visit (HOSPITAL_COMMUNITY)
Admission: RE | Admit: 2023-09-23 | Discharge: 2023-09-23 | Disposition: A | Discharge status: Home / Self Care | Source: Ambulatory Visit | Attending: Obstetrics and Gynecology | Admitting: Obstetrics and Gynecology

## 2023-09-23 ENCOUNTER — Encounter: Payer: Self-pay | Admitting: Obstetrics and Gynecology

## 2023-09-23 ENCOUNTER — Ambulatory Visit: Admitting: Obstetrics and Gynecology

## 2023-09-23 VITALS — BP 130/81 | HR 94 | Ht 64.0 in | Wt 243.0 lb

## 2023-09-23 DIAGNOSIS — Z01419 Encounter for gynecological examination (general) (routine) without abnormal findings: Secondary | ICD-10-CM | POA: Insufficient documentation

## 2023-09-23 DIAGNOSIS — Z1211 Encounter for screening for malignant neoplasm of colon: Secondary | ICD-10-CM | POA: Diagnosis not present

## 2023-09-23 DIAGNOSIS — L900 Lichen sclerosus et atrophicus: Secondary | ICD-10-CM | POA: Diagnosis not present

## 2023-09-23 DIAGNOSIS — Z1331 Encounter for screening for depression: Secondary | ICD-10-CM

## 2023-09-23 DIAGNOSIS — R61 Generalized hyperhidrosis: Secondary | ICD-10-CM | POA: Diagnosis not present

## 2023-09-23 MED ORDER — CLOBETASOL PROPIONATE 0.05 % EX OINT: 1.0000 | TOPICAL_OINTMENT | Freq: Every evening | CUTANEOUS | 0 refills | Status: DC

## 2023-09-23 NOTE — Progress Notes (Signed)
 ANNUAL EXAM Patient name: Amber Reilly MRN [id number]  Date of birth: April 06, 1965 Chief Complaint:   Gynecologic Exam  History of Present Illness:   Amber Reilly is a 58 y.o. [phone] with hx endometrial ablation being seen today for a routine annual exam.  Current complaints:  Night sweats since 2019.  Vulvar itching x 1-2 months since starting synthroid . Skin changes noted.   Hx open RSO 2019 - path c/w benign cystadenoma  Last pap 08/16/20. Results were: ASCUS w/ HRHPV negative. Last mammogram: 04/29/23. Results were: BI-RADS 1 Last colonoscopy: 08/17/17. Results were: tubular adenoma - due for repeat colonoscopy as of 07/2022     09/23/2023    1:12 PM 04/29/2022   11:12 AM 10/15/2021   10:26 AM 10/10/2020    3:09 PM  Depression screen PHQ 2/9  Decreased Interest 0 0 0 0  Down, Depressed, Hopeless 0 0 0 0  PHQ - 2 Score 0 0 0 0  Altered sleeping 0 0  1  Tired, decreased energy 0 0  0  Change in appetite 0 0  0  Feeling bad or failure about yourself  0 0  0  Trouble concentrating 0 0  0  Moving slowly or fidgety/restless 0 0  0  Suicidal thoughts 0 0  0  PHQ-9 Score 0 0  1  Difficult doing work/chores Not difficult at all Not difficult at all  Not difficult at all        09/23/2023    1:12 PM 04/29/2022   11:12 AM  GAD 7 : Generalized Anxiety Score  Nervous, Anxious, on Edge 0 0  Control/stop worrying 0 0  Worry too much - different things 0 0  Trouble relaxing 0 0  Restless 0 0  Easily annoyed or irritable 0 0  Afraid - awful might happen 0 0  Total GAD 7 Score 0 0  Anxiety Difficulty Not difficult at all Not difficult at all     Review of Systems:   Pertinent items are noted in HPI Denies any headaches, blurred vision, fatigue, shortness of breath, chest pain, abdominal pain, abnormal vaginal discharge/itching/odor/irritation, problems with periods, bowel movements, urination, or intercourse unless otherwise stated above. Pertinent History Reviewed:   Reviewed past medical,surgical, social and family history.  Reviewed problem list, medications and allergies. Physical Assessment:   Vitals:   09/23/23 1308  BP: 130/81  Pulse: 94  Weight: 243 lb (110.2 kg)  Height: 5' 4 (1.626 m)  Body mass index is 41.71 kg/m.        Physical Examination:   General appearance - well appearing, and in no distress  Mental status - alert, oriented to person, place, and time  Chest - respiratory effort normal  Heart - normal peripheral perfusion  Breasts - breasts appear normal, no suspicious masses, no skin or nipple changes or axillary nodes  Abdomen - soft, nontender, nondistended, no masses or organomegaly  Pelvic - VULVA: normal appearing vulva with no masses, tenderness or lesions  VAGINA: normal appearing vagina with normal color and discharge, no lesions  CERVIX: normal appearing cervix without discharge or lesions, no CMT  Thin prep pap is done with HR HPV cotesting  UTERUS: uterus is felt to be normal size, shape, consistency and nontender   ADNEXA: No adnexal masses or tenderness noted.  Chaperone present for exam  No results found for this or any previous visit (from the past 24 hours).  Assessment & Plan:  1) Well-Woman Exam Mammogram: in  1 year, or sooner if problems Colonoscopy: schedule screening colonoscopy as soon as possible Pap: Collected  GC/CT/HIV/HCV: Declines Discussed vaccinations - tdap, hep B, zoster; f/u w PCP  2) Lichen sclerosus Hypopigmented vulvar epithelium in keyhole distribution noted on exam today. Pt notes itching esp at night Discussed diagnosis/treatment of lichen as well as goal to avoid scratching to reduce risk of vulvar SCC -     clobetasol ointment (TEMOVATE) 0.05 %; Apply 1 Application topically at bedtime. Apply a thin layer to the pale skin on the vulva/perineum  3) Night sweats Suspected to be vasomotor symptoms related to menopause Discussed hormone therapy, SSRI/SNRI, gabapentin  or veozah  as options for management. She declines  Labs/procedures today:   Orders Placed This Encounter  Procedures   Ambulatory referral to Gastroenterology   Meds:  Meds ordered this encounter  Medications   clobetasol ointment (TEMOVATE) 0.05 %    Sig: Apply 1 Application topically at bedtime. Apply a thin layer to the pale skin on the vulva/perineum    Dispense:  60 g    Refill:  0   Follow-up: Return in about 4 weeks (around 10/21/2023) for follow up lichen sclerosus.  Kieth JAYSON Carolin, MD 09/23/2023 1:54 PM\

## 2023-09-23 NOTE — Patient Instructions (Signed)
 [employer] cabin crew

## 2023-09-28 ENCOUNTER — Ambulatory Visit: Payer: Self-pay | Admitting: Obstetrics and Gynecology

## 2023-09-28 LAB — CYTOLOGY - PAP
Comment: NEGATIVE
Diagnosis: NEGATIVE
High risk HPV: NEGATIVE

## 2023-09-30 ENCOUNTER — Other Ambulatory Visit (INDEPENDENT_AMBULATORY_CARE_PROVIDER_SITE_OTHER): Payer: Self-pay | Admitting: Otolaryngology

## 2023-10-21 ENCOUNTER — Encounter: Payer: Self-pay | Admitting: Obstetrics and Gynecology

## 2023-10-21 ENCOUNTER — Ambulatory Visit: Admitting: Obstetrics and Gynecology

## 2023-10-21 VITALS — BP 119/76 | HR 80 | Ht 64.0 in | Wt 242.0 lb

## 2023-10-21 DIAGNOSIS — L900 Lichen sclerosus et atrophicus: Secondary | ICD-10-CM

## 2023-10-21 NOTE — Progress Notes (Signed)
   RETURN GYNECOLOGY VISIT  Subjective:  Amber Reilly is a menopausal 58 y.o. presenting for lichen sclerosus follow up  Dx 09/23/23 - p/w 2 months vulvar itching and skin changes. Exam w/ hypopigmented vulvar epithelium in keyhole distribution. Started nightly clobetasol   Today, itching has resolved.   Objective:   Vitals:   10/21/23 1551  BP: 119/76  Pulse: 80  Weight: 242 lb (109.8 kg)  Height: 5' 4 (1.626 m)    General:  Alert, oriented and cooperative. Patient is in no acute distress.  Skin: Skin is warm and dry. No rash noted.   Cardiovascular: Normal heart rate noted  Respiratory: Normal respiratory effort, no problems with respiration noted     Pelvic: Improved pigmentation of labia majora, persistent perineal/perianal hypopigmentation. Declines photos for chart today  Exam performed in the presence of a chaperone  Assessment and Plan:  Amber Reilly is a 58 y.o. presenting for follow up of lichen sclerosus  - Clobetasol  nightly x 8 weeks to complete course, then anticipate transitioning to maintenance dosing  - Discussed importance of avoiding itching/scratching to lower risk of vulvar malignancy  Return in about 8 weeks (around 12/16/2023) for follow up lichen sclerosus.  Future Appointments  Date Time Provider Department Center  01/14/2024  1:00 PM [place], [place], MD [facility] None    Kieth JAYSON Carolin, MD

## 2023-12-08 ENCOUNTER — Telehealth: Payer: Self-pay | Admitting: *Deleted

## 2023-12-08 NOTE — Telephone Encounter (Signed)
 Returned call from 2:01 PM. Left patient a message to call and reschedule appointment.

## 2023-12-10 ENCOUNTER — Ambulatory Visit: Admitting: Obstetrics and Gynecology

## 2024-01-14 ENCOUNTER — Ambulatory Visit: Admitting: Endocrinology

## 2024-01-21 ENCOUNTER — Ambulatory Visit: Admitting: Obstetrics and Gynecology

## 2024-02-22 ENCOUNTER — Ambulatory Visit: Payer: Self-pay | Admitting: Obstetrics and Gynecology

## 2024-02-24 ENCOUNTER — Ambulatory Visit (INDEPENDENT_AMBULATORY_CARE_PROVIDER_SITE_OTHER): Admitting: Endocrinology

## 2024-02-24 ENCOUNTER — Other Ambulatory Visit

## 2024-02-24 ENCOUNTER — Encounter: Payer: Self-pay | Admitting: Endocrinology

## 2024-02-24 VITALS — BP 116/60 | HR 90 | Resp 16 | Ht 64.0 in | Wt 250.2 lb

## 2024-02-24 DIAGNOSIS — Z9889 Other specified postprocedural states: Secondary | ICD-10-CM | POA: Diagnosis not present

## 2024-02-24 DIAGNOSIS — Z8639 Personal history of other endocrine, nutritional and metabolic disease: Secondary | ICD-10-CM | POA: Diagnosis not present

## 2024-02-24 DIAGNOSIS — Z9089 Acquired absence of other organs: Secondary | ICD-10-CM

## 2024-02-24 DIAGNOSIS — E890 Postprocedural hypothyroidism: Secondary | ICD-10-CM

## 2024-02-24 NOTE — Progress Notes (Signed)
 Outpatient Endocrinology Note Amber Witherspoon, MD   Patient's Name: Amber Reilly    DOB: Dec 15, 1965    MRN: [id number]  REASON OF VISIT: Postsurgical hypothyroidism  PCP: Plotnikov, Karlynn GAILS, MD  REFERRING PROVIDER: Okey Burns, MD  HISTORY OF PRESENT ILLNESS:   Amber Reilly is a 58 y.o. old female with past medical history as listed below is presented for postsurgical hypothyroidism.    Pertinent Thyroid  History:  Patient is referred to endocrinology for evaluation and management of postsurgical hypothyroidism, history of multinodular goiter status post total thyroidectomy in Aug 11, 2023 due to neck compressive symptoms, initial visit on February 24, 2024.  Background: Patient has a history of multinodular goiter diagnosed in 2006, used to follow-up in this clinic with Dr. Kassie, was last time seen in March 2023.  She underwent biopsy in 2013 with benign follicular nodule, follow-up ultrasound in 2021 advised biopsy showed benign follicular nodule Bet[place]da category 2, nuclear medicine scan showed multinodular goiter with no hot and cold nodule.  She had ultrasound thyroid  in Aug 06, 2023 showed right lower pole nodule measuring 3.5 cm reported, not significantly changed and heterogenous thyroid  parenchyma, no adenopathy.  Patient was referred to ENT due to having symptoms of neck tightness and swelling, occasional difficulty swallowing was initially evaluated in May 2025.  She has CT neck in May 2025 moderately enlarged thyroid  goiter with mild retrosternal extension with mild mass effect on the trachea which remains patent.  Aug 11, 2023 : Patient underwent total thyroidectomy by Dr. Okey Burns, MD. And final surgical pathology consistent with multinodular goiter negative for malignancy.  FINAL MICROSCOPIC DIAGNOSIS:   A. THYROID , TOTAL, THYROIDECTOMY:  - Thyroid  follicular nodular disease (multinodular goiter).  - Changes consistent with prior FNA.  - Negative for  malignancy.   B. THYROID , RIGHT ADDITIONAL, EXCISION:  - Thyroid  follicular nodular disease (multinodular goiter).  - Negative for malignancy.   # Postsurgical hypothyroidism : Patient was started on levothyroxine  150 mcg daily postsurgically.  She was euthyroid not on thyroid  medication prior to surgery.  Patient had mildly low serum calcium  postsurgically, took calcium  supplement for few weeks after the surgery.  She reports she also had hoarseness of voice after the surgery for few weeks and recovered.  She has resolution of neck discomfort, dysphagia after thyroid  surgery.  Patient denies numbness and tingling of the extremities.  She has not been taking calcium  or vitamin D  supplement.  Interval history  Patient had total thyroidectomy in May, she is currently taking levothyroxine  150 mcg daily.  Reports compliance and taking in the morning and empty stomach.  Pathology was multinodular goiter, negative for malignancy.  She denies palpitation and heat intolerance.  She has complaints of weight gain after the surgery.  No change in bowel habit.  No cold intolerance.  No other complaints today.  REVIEW OF SYSTEMS:  As per history of present illness.   PAST MEDICAL HISTORY: Past Medical History:  Diagnosis Date   Arthritis    Hypertension     PAST SURGICAL HISTORY: Past Surgical History:  Procedure Laterality Date   ANKLE HARDWARE REMOVAL  02/15/2013   ANKLE SURGERY Left    fracture   CESAREAN SECTION  1992   CLOSED REDUCTION / MANIPULATION JOINT Right 05/2020   ENDOMETRIAL ABLATION     KNEE ARTHROPLASTY Right    Dr. Georgina at [facility]   KNEE ARTHROSCOPY Left 11/18/2012   meniscus repair   LAPAROTOMY N/A 11/26/2017   Procedure: EXPLORATORY LAPAROTOMY;  Surgeon: Eloy Herring, MD;  Location: [facility];  Service: Gynecology;  Laterality: N/A;   THYROIDECTOMY N/A 08/11/2023   Procedure: THYROIDECTOMY;  Surgeon: Okey Burns, MD;  Location: [facility];  Service: ENT;  Laterality: N/A;    UMBILICAL HERNIA REPAIR N/A 11/26/2017   Procedure: HERNIA REPAIR UMBILICAL ADULT;  Surgeon: Eloy Herring, MD;  Location: [facility];  Service: Gynecology;  Laterality: N/A;   UNILATERAL SALPINGECTOMY Right 11/26/2017   Procedure: RIGHT SALPINGO-OOPHORECTOMY;  Surgeon: Eloy Herring, MD;  Location: [facility];  Service: Gynecology;  Laterality: Right;    ALLERGIES: Allergies  Allergen Reactions   Tramadol  Other (See Comments)    Bad headache   Latex Swelling    Swelling at the site of contact only   Morphine And Codeine     Patient does not like the way it makes her feel     FAMILY HISTORY:  Family History  Problem Relation Age of Onset   Heart attack Father    Cancer Other    Goiter Paternal Aunt    Colon cancer Neg Hx    Esophageal cancer Neg Hx    Stomach cancer Neg Hx     SOCIAL HISTORY: Social History   Socioeconomic History   Marital status: Divorced    Spouse name: Not on file   Number of children: Not on file   Years of education: Not on file   Highest education level: Associate degree: academic program  Occupational History   Not on file  Tobacco Use   Smoking status: Never   Smokeless tobacco: Never  Vaping Use   Vaping status: Never Used  Substance and Sexual Activity   Alcohol use: No   Drug use: No   Sexual activity: Yes  Other Topics Concern   Not on file  Social History Narrative   Not on file   Social Drivers of Health   Financial Resource Strain: Low Risk  (05/27/2023)   Overall Financial Resource Strain (CARDIA)    Difficulty of Paying Living Expenses: Not hard at all  Food Insecurity: No Food Insecurity (08/11/2023)   Hunger Vital Sign    Worried About Running Out of Food in the Last Year: Never true    Ran Out of Food in the Last Year: Never true  Transportation Needs: No Transportation Needs (08/11/2023)   PRAPARE - Administrator, Civil Service (Medical): No    Lack of Transportation (Non-Medical): No  Physical Activity:  Insufficiently Active (05/27/2023)   Exercise Vital Sign    Days of Exercise per Week: 3 days    Minutes of Exercise per Session: 20 min  Stress: No Stress Concern Present (05/27/2023)   [employer] of Occupational Health - Occupational Stress Questionnaire    Feeling of Stress : Not at all  Social Connections: Moderately Integrated (05/27/2023)   Social Connection and Isolation Panel    Frequency of Communication with Friends and Family: More than three times a week    Frequency of Social Gatherings with Friends and Family: Twice a week    Attends Religious Services: More than 4 times per year    Active Member of [employer] or Organizations: Yes    Attends Banker Meetings: 1 to 4 times per year    Marital Status: Divorced    MEDICATIONS:  Current Outpatient Medications  Medication Sig Dispense Refill   acetaminophen  (TYLENOL ) 500 MG tablet Take 1 tablet (500 mg total) by mouth every 6 (six) hours. Take every 6 hrs x  2-3 days then take every 6 hrs as needed 30 tablet 0   fluticasone  (FLONASE ) 50 MCG/ACT nasal spray Place 2 sprays into both nostrils daily. (Patient taking differently: Place 2 sprays into both nostrils daily. As needed) 16 g 6   levocetirizine (XYZAL ) 5 MG tablet TAKE 1 TABLET BY MOUTH EVERY DAY IN THE EVENING (Patient taking differently: every evening. As needed) 90 tablet 1   SYNTHROID  150 MCG tablet TAKE 1 TABLET (150 MCG TOTAL) BY MOUTH DAILY AT 6 (SIX) AM. 90 tablet 1   telmisartan -hydrochlorothiazide  (MICARDIS  HCT) 40-12.5 MG tablet TAKE 1 TABLET BY MOUTH DAILY. ANNUAL APPT IS DUE MUST SEE PROVIDER FOR FUTURE REFILLS 90 tablet 3   No current facility-administered medications for this visit.    PHYSICAL EXAM: Vitals:   02/24/24 0850  BP: 116/60  Pulse: 90  Resp: 16  SpO2: 98%  Weight: 250 lb 3.2 oz (113.5 kg)  Height: 5' 4 (1.626 m)   Body mass index is 42.95 kg/m.  Wt Readings from Last 3 Encounters:  02/24/24 250 lb 3.2 oz (113.5 kg)   10/21/23 242 lb (109.8 kg)  09/23/23 243 lb (110.2 kg)     General: Well developed, well nourished female in no apparent distress.  HEENT: AT/[place], no external lesions. Hearing intact to the spoken word Eyes: EOMI. No stare, proptosis. Conjunctiva clear and no icterus. Neck: Trachea midline, neck supple, thyroidectomy scar present. Lungs: Clear to auscultation, no wheeze. Respirations not labored Heart: S1S2, Regular in rate and rhythm. Abdomen: Soft, non tender, non distended Neurologic: Alert, oriented, normal speech, deep tendon biceps reflexes normal,  no gross focal neurological deficit Extremities: No pedal pitting edema, no tremors of outstretched hands Skin: Warm, color good.  Psychiatric: Does not appear depressed or anxious  PERTINENT HISTORIC LABORATORY AND IMAGING STUDIES:  All pertinent laboratory results were reviewed. Please see HPI also for further details.   TSH  Date Value Ref Range Status  09/01/2023 0.25 (L) 0.35 - 5.50 uIU/mL Final  05/28/2023 0.46 0.35 - 5.50 uIU/mL Final  04/29/2022 0.66 0.35 - 5.50 uIU/mL Final   T3, Total  Date Value Ref Range Status  09/01/2023 110 76 - 181 ng/dL Final     ASSESSMENT / PLAN  1. Postsurgical hypothyroidism   2. Hypocalcemia   3. History of multinodular goiter   4. H/O total thyroidectomy    Patient had multinodular goiter, had multiple needle biopsy with benign cytology was followed with serial ultrasound.  Patient underwent total thyroidectomy in May 2025 due to neck compressive symptoms, large goiter with compressive features, final pathology was multinodular goiter, negative for malignancy.  Postsurgical hypothyroidism currently taking levothyroxine  150 mcg daily.  Patient had mild hypocalcemia postoperatively.  She took calcium  supplement for a few weeks after the surgery.  She is currently not taking calcium  and vitamin D  supplement.  Plan: - Check thyroid  function test and adjust the dose of levothyroxine  as  needed. - Check renal function panel, PTH and vitamin D  today for the evaluation of hypocalcemia. - Discussed about proper intake of levothyroxine .  Diagnoses and all orders for this visit:  Postsurgical hypothyroidism -     T4, free -     TSH  Hypocalcemia -     Renal function panel -     Parathyroid hormone, intact (no Ca) -     VITAMIN D  25 Hydroxy (Vit-D Deficiency, Fractures)  History of multinodular goiter  H/O total thyroidectomy    I spent 36 minutes in this visit,  with face to face encounter, medical records review, imagings review, documentation and discussion with patient about of findings, diagnosis, medications, management plan as above. All questions were answered.   DISPOSITION Follow up in clinic in 6 months suggested.  Labs today.  All questions answered and patient verbalized understanding of the plan.  Simran Bomkamp, MD [facility] Endocrinology [facility] [address] [place], [address] [place], [place] [address] Phone # [phone]  At least part of this note was generated using voice recognition software. Inadvertent word errors may have occurred, which were not recognized during the proofreading process.

## 2024-02-26 LAB — RENAL FUNCTION PANEL
Albumin: 4.1 g/dL (ref 3.6–5.1)
BUN: 20 mg/dL (ref 7–25)
CO2: 25 mmol/L (ref 20–32)
Calcium: 9.4 mg/dL (ref 8.6–10.4)
Chloride: 104 mmol/L (ref 98–110)
Creat: 0.95 mg/dL (ref 0.50–1.03)
Glucose, Bld: 85 mg/dL (ref 65–99)
Phosphorus: 3.4 mg/dL (ref 2.5–4.5)
Potassium: 4 mmol/L (ref 3.5–5.3)
Sodium: 139 mmol/L (ref 135–146)

## 2024-02-26 LAB — TSH: TSH: 0.31 m[IU]/L — ABNORMAL LOW (ref 0.40–4.50)

## 2024-02-26 LAB — VITAMIN D 25 HYDROXY (VIT D DEFICIENCY, FRACTURES): Vit D, 25-Hydroxy: 35 ng/mL (ref 30–100)

## 2024-02-26 LAB — PARATHYROID HORMONE, INTACT (NO CA): PTH: 22 pg/mL (ref 16–77)

## 2024-02-26 LAB — T4, FREE: Free T4: 1.8 ng/dL (ref 0.8–1.8)

## 2024-02-29 ENCOUNTER — Ambulatory Visit: Payer: Self-pay | Admitting: Endocrinology

## 2024-02-29 DIAGNOSIS — E890 Postprocedural hypothyroidism: Secondary | ICD-10-CM

## 2024-02-29 MED ORDER — LEVOTHYROXINE SODIUM 150 MCG PO TABS: 150.0000 ug | ORAL_TABLET | Freq: Every day | ORAL | 3 refills | Status: AC

## 2024-03-16 ENCOUNTER — Ambulatory Visit: Admitting: Obstetrics and Gynecology

## 2024-03-16 ENCOUNTER — Encounter: Payer: Self-pay | Admitting: Obstetrics and Gynecology

## 2024-03-16 ENCOUNTER — Other Ambulatory Visit (HOSPITAL_COMMUNITY)
Admission: RE | Admit: 2024-03-16 | Discharge: 2024-03-16 | Disposition: A | Source: Ambulatory Visit | Attending: Obstetrics and Gynecology | Admitting: Obstetrics and Gynecology

## 2024-03-16 VITALS — BP 107/70 | HR 91 | Ht 64.0 in | Wt 247.0 lb

## 2024-03-16 DIAGNOSIS — N898 Other specified noninflammatory disorders of vagina: Secondary | ICD-10-CM

## 2024-03-16 DIAGNOSIS — L900 Lichen sclerosus et atrophicus: Secondary | ICD-10-CM | POA: Diagnosis not present

## 2024-03-16 MED ORDER — CLOBETASOL PROPIONATE 0.05 % EX OINT: TOPICAL_OINTMENT | CUTANEOUS | 1 refills | Status: AC

## 2024-03-16 NOTE — Progress Notes (Signed)
° ° °  GYNECOLOGY OFFICE VULVAR BIOPSY PROCEDURE NOTE  58 y.o. [id number] here for vulvar biopsy  Informed consent and review of risks, benefit and alternatives performed. Written consent given.   Skin was prepped with betadine. 2cc of 1% lidocaine  with epinephrine  was injected for anesthesia. A 3mm punch biopsy was used to biopsy the lesion at the edge with normal adjacent tissue. There was no particularly worrisome or abnormal area that needed to be biopsied. Pressure applied and hemostasis was excellent. All instruments removed  Pt tolerated well with minimal bleeding.   Patient was given post procedure instructions.  Will follow up pathology and manage accordingly; patient will be contacted with results and recommendations.  Routine preventative health maintenance measures emphasized.  Kieth Carolin, MD Obstetrician & Gynecologist, [facility] for [employer], [facility]

## 2024-03-16 NOTE — Addendum Note (Signed)
 Addended by: ORLINDA SILVANO ORN on: 03/16/2024 01:41 PM   Modules accepted: Orders

## 2024-03-16 NOTE — Progress Notes (Signed)
° °  RETURN GYNECOLOGY VISIT  Subjective:  Amber Reilly is a 58 y.o. menopausal G4P0011 presenting for lichen sclerosus follow up  Dx made 09/23/23 by itching & hypopigmentation in keyhole distribution on exam. Started clobetasol  nightly. At 1 month follow up, pigmentation of the labia majora had improved but hypopigmentation of the perineum & perianal area persisted. She completed nightly clobestasol x 2 additional months and was planned for follow up but did not make her appt.   She has not used clobetasol  since completing her nightly course in Sept. She notes a persistent quarter sized spot near the anus. Asymptomatic, no itching.   Objective:   Vitals:   03/16/24 1013  BP: 107/70  Pulse: 91  Weight: 247 lb (112 kg)  Height: 5' 4 (1.626 m)   General:  Alert, oriented and cooperative. Patient is in no acute distress.  Skin: Skin is warm and dry. No rash noted.   Cardiovascular: Normal heart rate noted  Respiratory: Normal respiratory effort, no problems with respiration noted  Abdomen: Soft, non-tender, non-distended   Pelvic: No remaining vulvar lesions. Has quarter sized hypopigmented perianal lesion on left side with smaller surrounding hypopigmentation.  Exam performed in the presence of a chaperone  Assessment and Plan:  [name] is a 58 y.o. with lichen sclerosus  - Since lesions persist after 3 months of nightly therapy will biopsy today; suspect this is just due to a break from steroids and subtherapeutic course.  - Biopsy uncomplicated, see separate procedure note - Clobetasol  nightly x 30 days, then 2x/wk thereafter as long as diagnosis is confirmed - She strongly desires spaced appt as these visits are very stressful. Will space to q98mo and if stable, can space until q50month appt.  - Reviewed the maintenance clobetasol  will likely be a lifelong medication -     clobetasol  ointment (TEMOVATE ) 0.05 %; Apply 1 Application topically at bedtime for 30 days, THEN 1  Application 2 (two) times a week.  Return in about 3 months (around 06/14/2024) for follow up lichen sclerosus.  Future Appointments  Date Time Provider Department Center  08/23/2024  8:40 AM Thapa, [place], MD [facility] None   Kieth JAYSON Carolin, MD

## 2024-03-17 ENCOUNTER — Other Ambulatory Visit (HOSPITAL_BASED_OUTPATIENT_CLINIC_OR_DEPARTMENT_OTHER): Payer: Self-pay | Admitting: Internal Medicine

## 2024-03-17 DIAGNOSIS — Z1231 Encounter for screening mammogram for malignant neoplasm of breast: Secondary | ICD-10-CM

## 2024-03-22 LAB — SURGICAL PATHOLOGY

## 2024-03-23 ENCOUNTER — Ambulatory Visit: Payer: Self-pay | Admitting: Obstetrics and Gynecology

## 2024-03-31 ENCOUNTER — Telehealth: Admitting: Physician Assistant

## 2024-03-31 DIAGNOSIS — R509 Fever, unspecified: Secondary | ICD-10-CM | POA: Diagnosis not present

## 2024-03-31 DIAGNOSIS — R6889 Other general symptoms and signs: Secondary | ICD-10-CM

## 2024-03-31 MED ORDER — OSELTAMIVIR PHOSPHATE 75 MG PO CAPS: 75.0000 mg | ORAL_CAPSULE | Freq: Two times a day (BID) | ORAL | 0 refills | Status: AC

## 2024-03-31 NOTE — Patient Instructions (Signed)
 " Karna LITTIE Sou, thank you for joining [name], PA-C for today's virtual visit.  While this provider is not your primary care provider (PCP), if your PCP is located in our provider database this encounter information will be shared with them immediately following your visit.   A [facility] MyChart account gives you access to today's visit and all your visits, tests, and labs performed at [facility]  click here if you don't have a [facility] MyChart account or go to [email or web address]  Consent: (Patient) Karna LITTIE Sou provided verbal consent for this virtual visit at the beginning of the encounter.  Current Medications:  Current Outpatient Medications:    oseltamivir (TAMIFLU) 75 MG capsule, Take 1 capsule (75 mg total) by mouth 2 (two) times daily for 5 days., Disp: 10 capsule, Rfl: 0   acetaminophen  (TYLENOL ) 500 MG tablet, Take 1 tablet (500 mg total) by mouth every 6 (six) hours. Take every 6 hrs x 2-3 days then take every 6 hrs as needed, Disp: 30 tablet, Rfl: 0   clobetasol  ointment (TEMOVATE ) 0.05 %, Apply 1 Application topically at bedtime for 30 days, THEN 1 Application 2 (two) times a week., Disp: 60 g, Rfl: 1   fluticasone  (FLONASE ) 50 MCG/ACT nasal spray, Place 2 sprays into both nostrils daily., Disp: 16 g, Rfl: 6   levocetirizine (XYZAL ) 5 MG tablet, TAKE 1 TABLET BY MOUTH EVERY DAY IN THE EVENING, Disp: 90 tablet, Rfl: 1   levothyroxine  (SYNTHROID ) 150 MCG tablet, Take 1 tablet (150 mcg total) by mouth daily before breakfast., Disp: 90 tablet, Rfl: 3   telmisartan -hydrochlorothiazide  (MICARDIS  HCT) 40-12.5 MG tablet, TAKE 1 TABLET BY MOUTH DAILY. ANNUAL APPT IS DUE MUST SEE PROVIDER FOR FUTURE REFILLS, Disp: 90 tablet, Rfl: 3   Medications ordered in this encounter:  Meds ordered this encounter  Medications   oseltamivir (TAMIFLU) 75 MG capsule    Sig: Take 1 capsule (75 mg total) by mouth 2 (two) times daily for 5 days.    Dispense:  10  capsule    Refill:  0    Supervising Provider:   BLAISE ALEENE KIDD [[id number]]     *If you need refills on other medications prior to your next appointment, please contact your pharmacy*  Follow-Up: Call back or seek an in-person evaluation if the symptoms worsen or if the condition fails to improve as anticipated.  [facility] [phone]  Other Instructions Please keep well-hydrated and try to get plenty of rest. If you have a humidifier, place it in the bedroom and run it at night. Start a saline nasal rinse for nasal congestion. You can consider use of a nasal steroid spray like Flonase  or Nasacort  OTC. You can alternate between Tylenol  and Ibuprofen  if needed for fever, body aches, headache and/or throat pain. Salt water-gargles and chloraseptic spray can be very beneficial for sore throat. Mucinex-DM for congestion or cough. Please take all prescribed medications as directed.  Remain out of work until [employer] for 24 hours without a fever-reducing medication, and you are feeling better.  You should mask until symptoms are resolved.  If anything worsens despite treatment, you need to be evaluated in-person. Please do not delay care.  Influenza, Adult Influenza is also called the flu. It is an infection in the lungs, nose, and throat (respiratory tract). It spreads easily from person to person (is contagious). The flu causes symptoms that are like a cold, along with high fever and body aches. What are  the causes? This condition is caused by the influenza virus. You can get the virus by: Breathing in droplets that are in the air after a person infected with the flu coughed or sneezed. Touching something that has the virus on it and then touching your mouth, nose, or eyes. What increases the risk? Certain things may make you more likely to get the flu. These include: Not washing your hands often. Having close contact with many people during cold and flu  season. Touching your mouth, eyes, or nose without first washing your hands. Not getting a flu shot every year. You may have a higher risk for the flu, and serious problems, such as a lung infection (pneumonia), if you: Are older than 65. Are pregnant. Have a weakened disease-fighting system (immune system) because of a disease or because you are taking certain medicines. Have a long-term (chronic) condition, such as: Heart, kidney, or lung disease. Diabetes. Asthma. Have a liver disorder. Are very overweight (morbidly obese). Have anemia. What are the signs or symptoms? Symptoms usually begin suddenly and last 4-14 days. They may include: Fever and chills. Headaches, body aches, or muscle aches. Sore throat. Cough. Runny or stuffy (congested) nose. Feeling discomfort in your chest. Not wanting to eat as much as normal. Feeling weak or tired. Feeling dizzy. Feeling sick to your stomach or throwing up. How is this treated? If the flu is found early, you can be treated with antiviral medicine. This can help to reduce how bad the illness is and how long it lasts. This may be given by mouth or through an IV tube. Taking care of yourself at home can help your symptoms get better. Your doctor may want you to: Take over-the-counter medicines. Drink plenty of fluids. The flu often goes away on its own. If you have very bad symptoms or other problems, you may be treated in a hospital. Follow these instructions at home:     Activity Rest as needed. Get plenty of sleep. Stay home from work or school as told by your doctor. Do not leave home until you do not have a fever for 24 hours without taking medicine. Leave home only to go to your doctor. Eating and drinking Take an ORS (oral rehydration solution). This is a drink that is sold at pharmacies and stores. Drink enough fluid to keep your pee pale yellow. Drink clear fluids in small amounts as you are able. Clear fluids  include: Water. Ice chips. Fruit juice mixed with water. Low-calorie sports drinks. Eat bland foods that are easy to digest. Eat small amounts as you are able. These foods include: Bananas. Applesauce. Rice. Lean meats. Toast. Crackers. Do not eat or drink: Fluids that have a lot of sugar or caffeine. Alcohol. Spicy or fatty foods. General instructions Take over-the-counter and prescription medicines only as told by your doctor. Use a cool mist humidifier to add moisture to the air in your home. This can make it easier for you to breathe. When using a cool mist humidifier, clean it daily. Empty water and replace with clean water. Cover your mouth and nose when you cough or sneeze. Wash your hands with soap and water often and for at least 20 seconds. This is also important after you cough or sneeze. If you cannot use soap and water, use alcohol-based hand sanitizer. Keep all follow-up visits. How is this prevented?  Get a flu shot every year. You may get the flu shot in late summer, fall, or winter. Ask your  doctor when you should get your flu shot. Avoid contact with people who are sick during fall and winter. This is cold and flu season. Contact a doctor if: You get new symptoms. You have: Chest pain. Watery poop (diarrhea). A fever. Your cough gets worse. You start to have more mucus. You feel sick to your stomach. You throw up. Get help right away if you: Have shortness of breath. Have trouble breathing. Have skin or nails that turn a bluish color. Have very bad pain or stiffness in your neck. Get a sudden headache. Get sudden pain in your face or ear. Cannot eat or drink without throwing up. These symptoms may represent a serious problem that is an emergency. Get medical help right away. Call your local emergency services (911 in the [place] Do not wait to see if the symptoms will go away. Do not drive yourself to the hospital. Summary Influenza is also called  the flu. It is an infection in the lungs, nose, and throat. It spreads easily from person to person. Take over-the-counter and prescription medicines only as told by your doctor. Getting a flu shot every year is the best way to not get the flu. This information is not intended to replace advice given to you by your health care provider. Make sure you discuss any questions you have with your health care provider. Document Revised: 11/04/2019 Document Reviewed: 11/04/2019 Elsevier Patient Education  2023 Elsevier Inc.      If you have been instructed to have an in-person evaluation today at a local Urgent Care facility, please use the link below. It will take you to a list of all of our available [facility], including address, phone number and hours of operation. Please do not delay care.  [facility]  If you or a family member do not have a primary care provider, use the link below to schedule a visit and establish care. When you choose a [facility] primary care physician or advanced practice provider, you gain a long-term partner in health. Find a Primary Care Provider  Learn more about [facility]'s in-office and virtual care options: [facility] Get Care Now  "

## 2024-03-31 NOTE — Progress Notes (Signed)
 " Virtual Visit Consent   Amber Reilly, you are scheduled for a virtual visit with a [facility] provider today. Just as with appointments in the office, your consent must be obtained to participate. Your consent will be active for this visit and any virtual visit you may have with one of our providers in the next 365 days. If you have a MyChart account, a copy of this consent can be sent to you electronically.  As this is a virtual visit, video technology does not allow for your provider to perform a traditional examination. This may limit your provider's ability to fully assess your condition. If your provider identifies any concerns that need to be evaluated in person or the need to arrange testing (such as labs, EKG, etc.), we will make arrangements to do so. Although advances in technology are sophisticated, we cannot ensure that it will always work on either your end or our end. If the connection with a video visit is poor, the visit may have to be switched to a telephone visit. With either a video or telephone visit, we are not always able to ensure that we have a secure connection.  By engaging in this virtual visit, you consent to the provision of healthcare and authorize for your insurance to be billed (if applicable) for the services provided during this visit. Depending on your insurance coverage, you may receive a charge related to this service.  I need to obtain your verbal consent now. Are you willing to proceed with your visit today? Amber Reilly has provided verbal consent on 03/31/2024 for a virtual visit (video or telephone). Amber Reilly, [place]  Date: 03/31/2024 10:20 AM   Virtual Visit via Video Note   I, Amber Reilly, connected with  Amber Reilly  ([id number], [birth year]) on 03/31/2024 at 10:15 AM EST by a video-enabled telemedicine application and verified that I am speaking with the correct person using two identifiers.  Location: Patient: Virtual Visit  Location Patient: Home Provider: Virtual Visit Location Provider: Home Office   I discussed the limitations of evaluation and management by telemedicine and the availability of in person appointments. The patient expressed understanding and agreed to proceed.    History of Present Illness: Amber Reilly is a 59 y.o. who identifies as a female who was assigned female at birth, and is being seen today for concern of influenza. Notes Tuesday early AM with fever, chills, diarrhea, head cold symptoms. Tuesday night into Wednesday noting body aches (now improved). Still with sinus pressure, congestion and persistent dry cough. Some intermittent fever. Denies chest pain or SOB.  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   Solar dermatitis 10/15/2021   Need for hepatitis C screening test 01/24/2020   Hyperglycemia 01/24/2020   Ovarian cyst 11/26/2017   Obesity (BMI 35.0-39.9 without comorbidity) 07/14/2017   Multinodular goiter 07/17/2016   Migraine 06/30/2016   Plantar fasciitis of left foot 09/16/2013   Low back pain 09/05/2013   Well adult exam 04/05/2013   Hypertension 04/05/2013   Vaginitis and vulvovaginitis 03/15/2013   Goiter 12/23/2011   Hydradenitis 08/20/2011   WEIGHT GAIN 04/06/2008    Allergies: Allergies[1] Medications: Current Medications[2]  Observations/Objective: Patient is well-developed, well-nourished in no acute distress.  Resting comfortably  at home.  Head is normocephalic, atraumatic.  No labored breathing.  Speech is clear and coherent with logical content.  Patient is alert and oriented at baseline.   Assessment and Plan: 1. Febrile illness (Primary) - oseltamivir (TAMIFLU) 75 MG capsule; Take 1 capsule (75 mg total) by mouth 2 (two) times daily for 5 days.  Dispense: 10 capsule; Refill: 0  2.  Flu-like symptoms - oseltamivir (TAMIFLU) 75 MG capsule; Take 1 capsule (75 mg total) by mouth 2 (two) times daily for 5 days.  Dispense: 10 capsule; Refill: 0  Classic influenza symptoms. Unsure of exposure. Supportive measures, OTC medications and Vitamin recommendations reviewed. Will start Tamiflu per orders. Quarantine reviewed with patient.    Follow Up Instructions: I discussed the assessment and treatment plan with the patient. The patient was provided an opportunity to ask questions and all were answered. The patient agreed with the plan and demonstrated an understanding of the instructions.  A copy of instructions were sent to the patient via MyChart unless otherwise noted below.   The patient was advised to call back or seek an in-person evaluation if the symptoms worsen or if the condition fails to improve as anticipated.    Amber Velma Lunger, PA-C    [1]  Allergies Allergen Reactions   Tramadol  Other (See Comments)    Bad headache   Latex Swelling    Swelling at the site of contact only   Morphine And Codeine     Patient does not like the way it makes her feel   [2]  Current Outpatient Medications:    oseltamivir (TAMIFLU) 75 MG capsule, Take 1 capsule (75 mg total) by mouth 2 (two) times daily for 5 days., Disp: 10 capsule, Rfl: 0   acetaminophen  (TYLENOL ) 500 MG tablet, Take 1 tablet (500 mg total) by mouth every 6 (six) hours. Take every 6 hrs x 2-3 days then take every 6 hrs as needed, Disp: 30 tablet, Rfl: 0   clobetasol  ointment (TEMOVATE ) 0.05 %, Apply 1 Application topically at bedtime for 30 days, THEN 1 Application 2 (two) times a week., Disp: 60 g, Rfl: 1   fluticasone  (FLONASE ) 50 MCG/ACT nasal spray, Place 2 sprays into both nostrils daily., Disp: 16 g, Rfl: 6   levocetirizine (XYZAL ) 5 MG tablet, TAKE 1 TABLET BY MOUTH EVERY DAY IN THE EVENING, Disp: 90 tablet, Rfl: 1   levothyroxine  (SYNTHROID ) 150 MCG tablet, Take 1 tablet (150 mcg total) by mouth daily  before breakfast., Disp: 90 tablet, Rfl: 3   telmisartan -hydrochlorothiazide  (MICARDIS  HCT) 40-12.5 MG tablet, TAKE 1 TABLET BY MOUTH DAILY. ANNUAL APPT IS DUE MUST SEE PROVIDER FOR FUTURE REFILLS, Disp: 90 tablet, Rfl: 3  "

## 2024-04-30 ENCOUNTER — Ambulatory Visit (HOSPITAL_BASED_OUTPATIENT_CLINIC_OR_DEPARTMENT_OTHER)

## 2024-08-23 ENCOUNTER — Ambulatory Visit: Admitting: Endocrinology
